# Patient Record
Sex: Male | Born: 1975 | Race: Asian | Hispanic: No | Smoking: Current every day smoker
Health system: Southern US, Community
[De-identification: ages and names within clinical notes are randomized; demographics above are authoritative.]

## PROBLEM LIST (undated history)

## (undated) DIAGNOSIS — K219 Gastro-esophageal reflux disease without esophagitis: Secondary | ICD-10-CM

## (undated) DIAGNOSIS — G4730 Sleep apnea, unspecified: Secondary | ICD-10-CM

## (undated) DIAGNOSIS — T1491XA Suicide attempt, initial encounter: Secondary | ICD-10-CM

## (undated) DIAGNOSIS — Z862 Personal history of diseases of the blood and blood-forming organs and certain disorders involving the immune mechanism: Secondary | ICD-10-CM

## (undated) DIAGNOSIS — R519 Headache, unspecified: Secondary | ICD-10-CM

## (undated) DIAGNOSIS — J189 Pneumonia, unspecified organism: Secondary | ICD-10-CM

## (undated) DIAGNOSIS — I10 Essential (primary) hypertension: Secondary | ICD-10-CM

## (undated) DIAGNOSIS — N529 Male erectile dysfunction, unspecified: Secondary | ICD-10-CM

## (undated) DIAGNOSIS — Z87442 Personal history of urinary calculi: Secondary | ICD-10-CM

## (undated) DIAGNOSIS — S93121A Dislocation of metatarsophalangeal joint of right great toe, initial encounter: Secondary | ICD-10-CM

## (undated) DIAGNOSIS — N200 Calculus of kidney: Secondary | ICD-10-CM

## (undated) DIAGNOSIS — R7303 Prediabetes: Secondary | ICD-10-CM

## (undated) DIAGNOSIS — E291 Testicular hypofunction: Secondary | ICD-10-CM

## (undated) HISTORY — DX: Suicide attempt, initial encounter: T14.91XA

## (undated) HISTORY — DX: Pneumonia, unspecified organism: J18.9

## (undated) HISTORY — DX: Calculus of kidney: N20.0

## (undated) HISTORY — DX: Dislocation of metatarsophalangeal joint of right great toe, initial encounter: S93.121A

## (undated) HISTORY — DX: Essential (primary) hypertension: I10

## (undated) HISTORY — DX: Testicular hypofunction: E29.1

## (undated) HISTORY — DX: Personal history of diseases of the blood and blood-forming organs and certain disorders involving the immune mechanism: Z86.2

## (undated) HISTORY — PX: NO PAST SURGERIES: SHX2092

## (undated) HISTORY — DX: Sleep apnea, unspecified: G47.30

## (undated) HISTORY — DX: Male erectile dysfunction, unspecified: N52.9

---

## 2008-07-15 ENCOUNTER — Emergency Department (HOSPITAL_COMMUNITY): Admission: EM | Admit: 2008-07-15 | Discharge: 2008-07-15 | Payer: Self-pay | Admitting: Emergency Medicine
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
## 2008-07-15 IMAGING — CR DG FOOT COMPLETE 3+V*R*
3 series · 3 of 3 positions shown · non-contrast
Comparison: Previous study of [DATE] at [0O] hours.

CLINICAL DATA: History given of status post reduction first toe
dislocation.

RIGHT FOOT COMPLETE - 3+ VIEW

[view not recorded (1 of 3)]
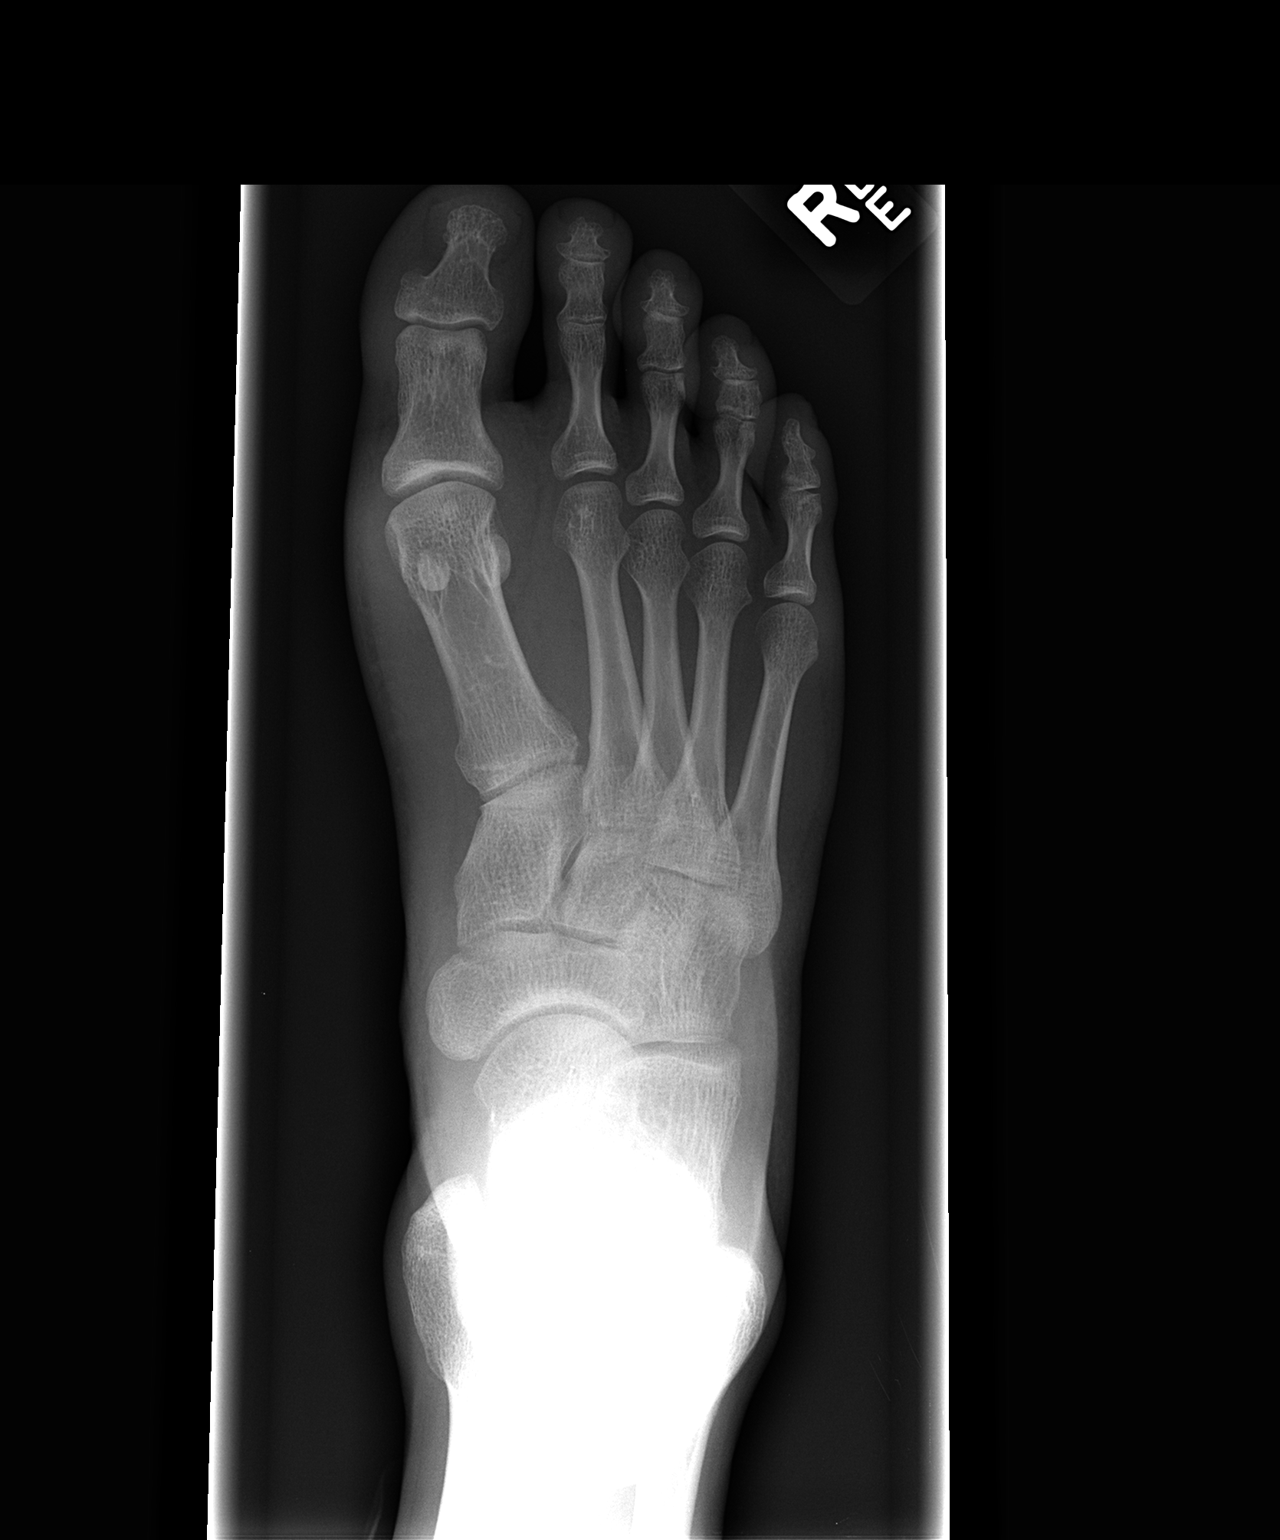

[view not recorded (2 of 3)]
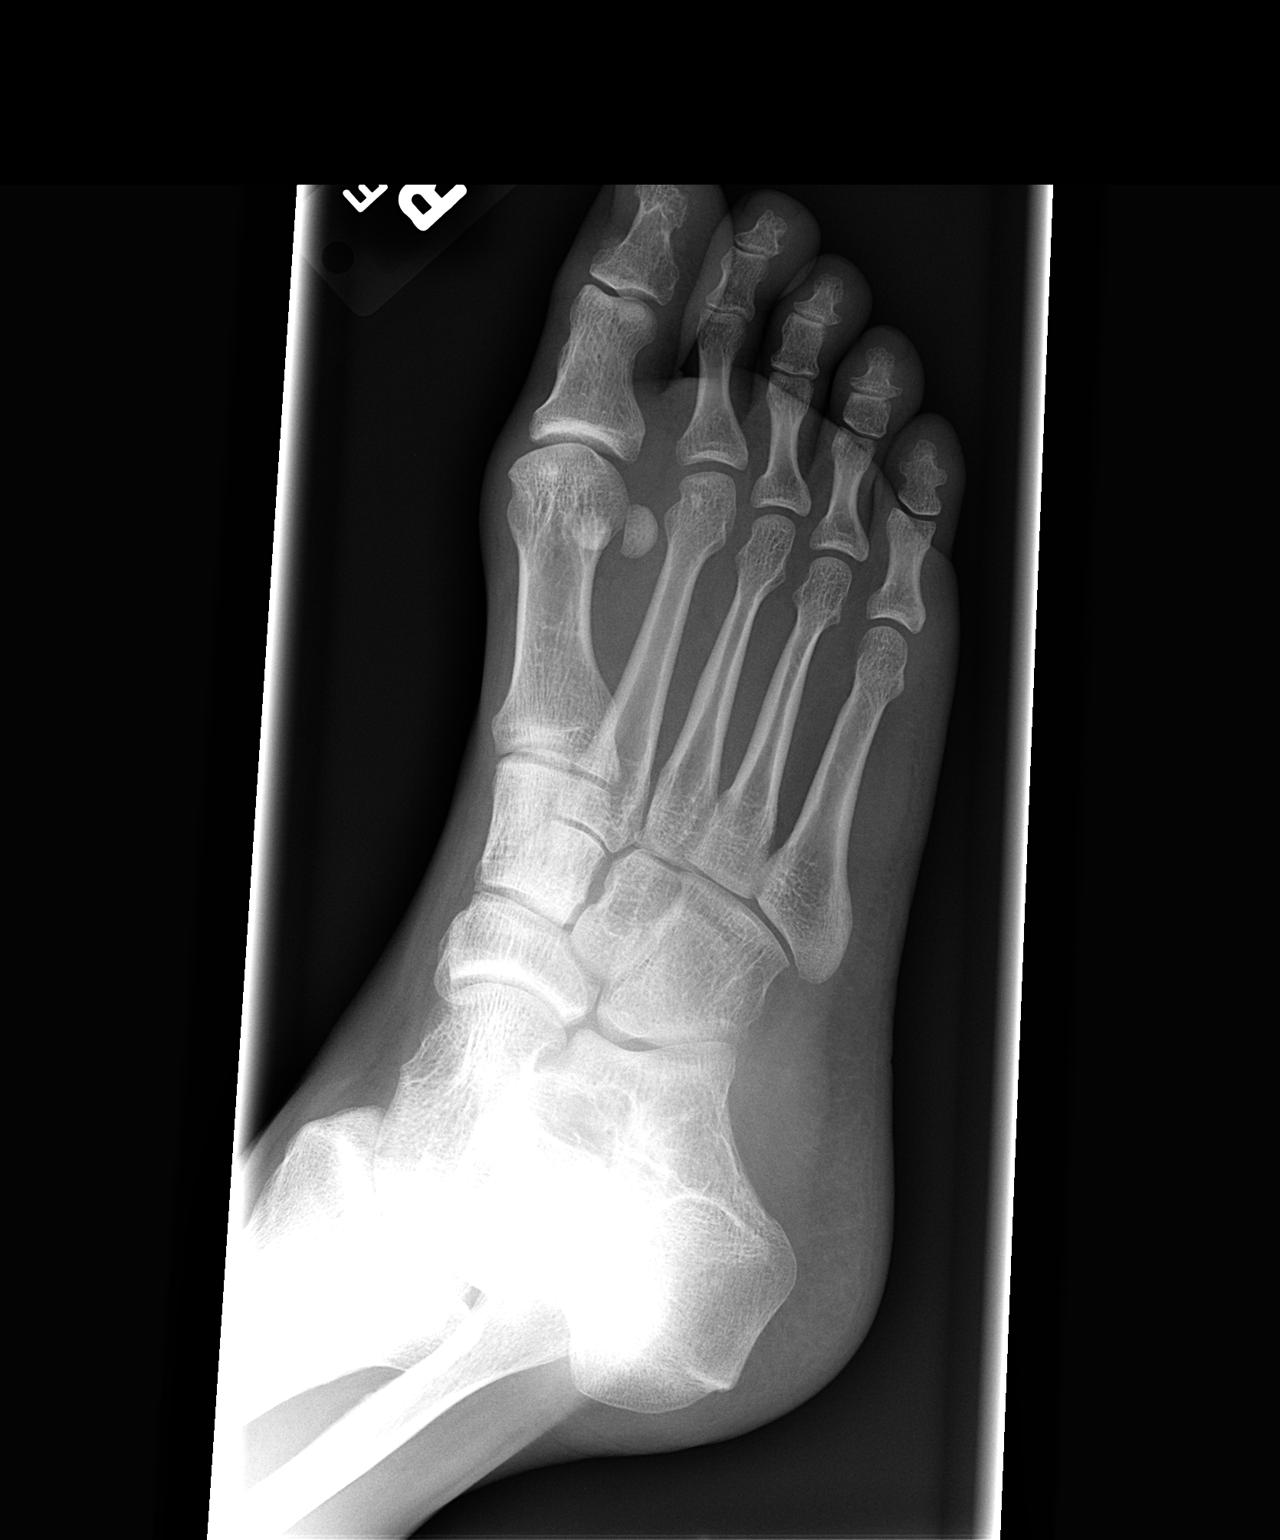

[view not recorded (3 of 3)]
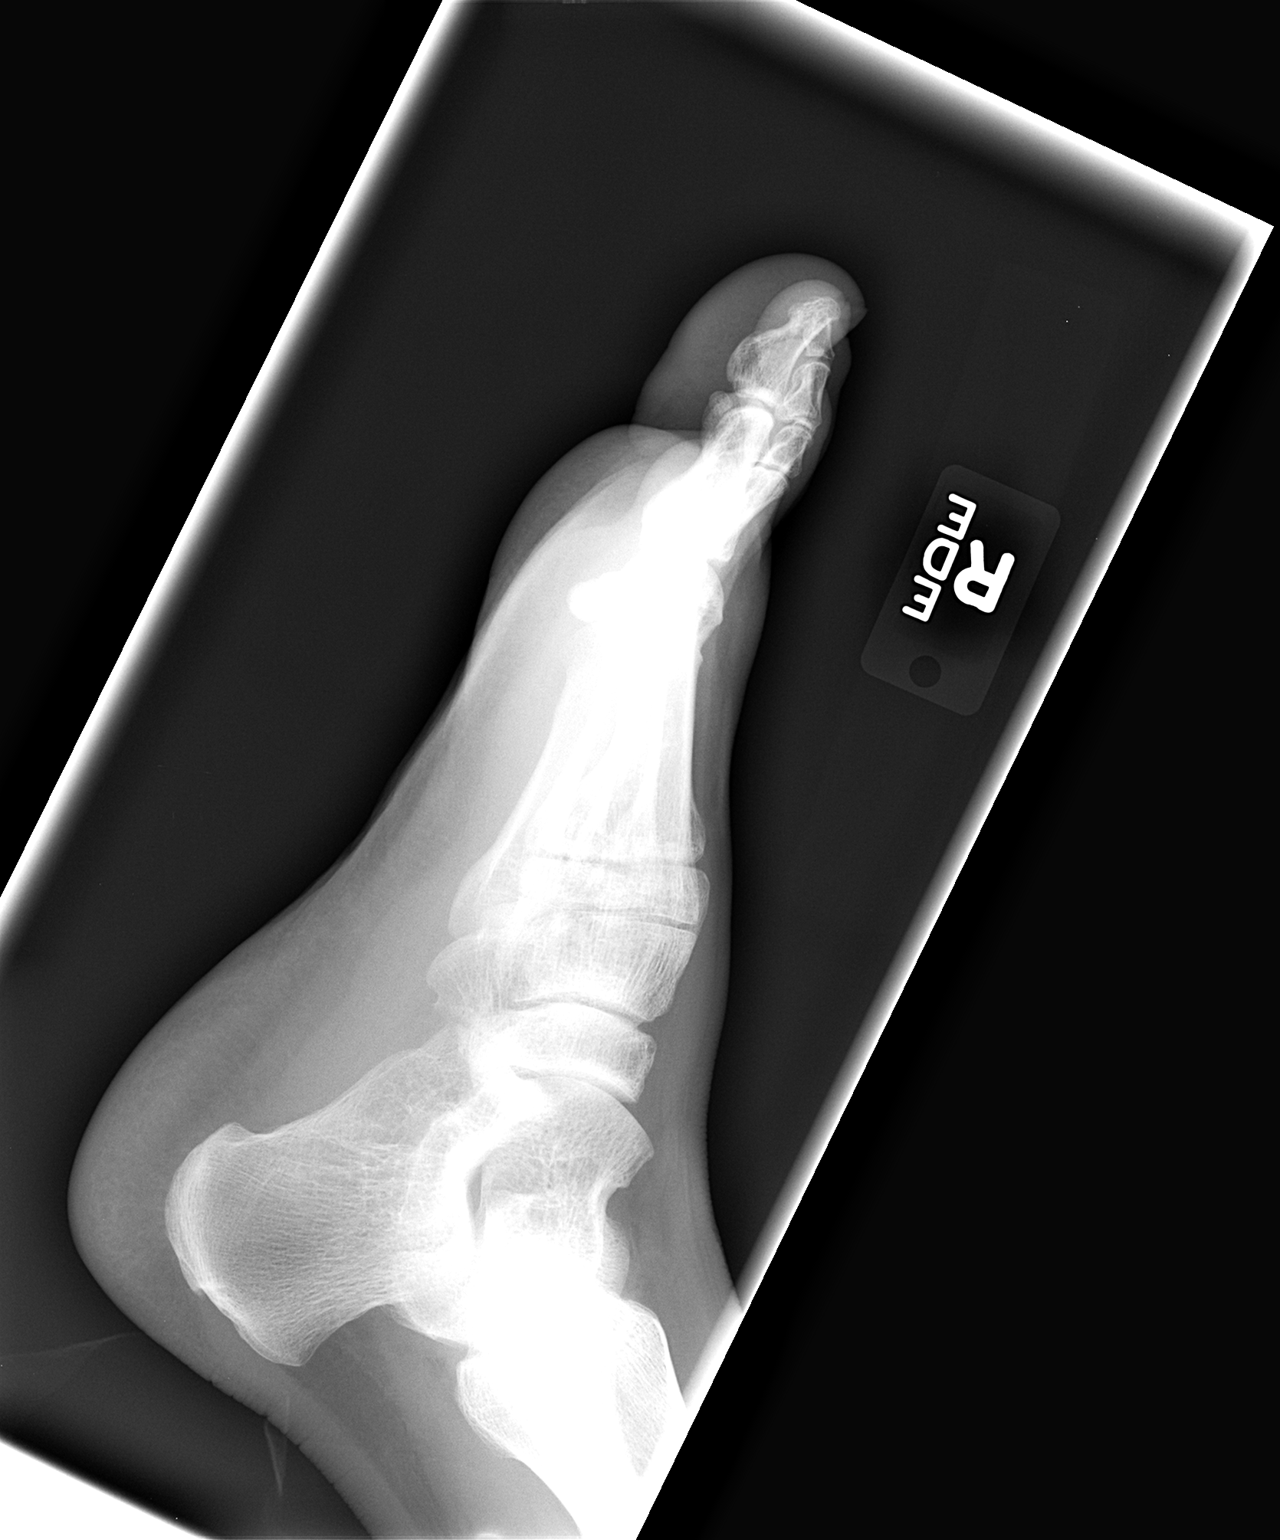

[3 of 3 positions shown; findings below may reference images not displayed]

FINDINGS: Previous study demonstrate dislocation of the first toe
at the first MTP joint.  This has been reduced with relocation. No
fractures evident.
IMPRESSION: The previous dislocation of the first toe at the first MTP joint
has been reduced.  No fracture is evident.
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
## 2010-06-20 LAB — POCT I-STAT, CHEM 8
BUN: 12 mg/dL (ref 6–23)
Calcium, Ion: 1.01 mmol/L — ABNORMAL LOW (ref 1.12–1.32)
HCT: 47 % (ref 39.0–52.0)
Hemoglobin: 16 g/dL (ref 13.0–17.0)
Sodium: 138 mEq/L (ref 135–145)
TCO2: 23 mmol/L (ref 0–100)

## 2010-06-20 LAB — DIFFERENTIAL
Basophils Absolute: 0 10*3/uL (ref 0.0–0.1)
Basophils Relative: 0 % (ref 0–1)
Lymphocytes Relative: 36 % (ref 12–46)
Monocytes Absolute: 0.4 10*3/uL (ref 0.1–1.0)
Neutro Abs: 5.2 10*3/uL (ref 1.7–7.7)
Neutrophils Relative %: 57 % (ref 43–77)

## 2010-06-20 LAB — CBC
Hemoglobin: 15.8 g/dL (ref 13.0–17.0)
MCHC: 35.4 g/dL (ref 30.0–36.0)
RDW: 12.8 % (ref 11.5–15.5)

## 2010-06-20 LAB — PROTIME-INR
INR: 1 (ref 0.00–1.49)
Prothrombin Time: 13.1 seconds (ref 11.6–15.2)

## 2010-06-20 LAB — APTT: aPTT: 34 seconds (ref 24–37)

## 2012-01-29 ENCOUNTER — Encounter: Payer: Self-pay | Admitting: Family Medicine

## 2012-01-29 ENCOUNTER — Ambulatory Visit (INDEPENDENT_AMBULATORY_CARE_PROVIDER_SITE_OTHER): Payer: BC Managed Care – PPO | Admitting: Family Medicine

## 2012-01-29 VITALS — BP 120/78 | HR 80 | Temp 98.8°F | Resp 12 | Ht 68.0 in | Wt 146.0 lb

## 2012-01-29 DIAGNOSIS — R05 Cough: Secondary | ICD-10-CM

## 2012-01-29 DIAGNOSIS — R059 Cough, unspecified: Secondary | ICD-10-CM

## 2012-01-29 DIAGNOSIS — J029 Acute pharyngitis, unspecified: Secondary | ICD-10-CM

## 2012-01-29 LAB — POCT RAPID STREP A (OFFICE): Rapid Strep A Screen: NEGATIVE

## 2012-01-29 MED ORDER — AZITHROMYCIN 250 MG PO TABS: ORAL_TABLET | ORAL | Status: AC
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
## 2012-01-29 NOTE — Patient Instructions (Addendum)
Guaifenesin oral ER tablets  What is this medicine?  GUAIFENESIN (gwye FEN e sin) is an expectorant. It helps to thin mucous and make coughs more productive. This medicine is used to treat coughs caused by colds or the flu. It is not intended to treat chronic cough caused by smoking, asthma, emphysema, or heart failure.  This medicine may be used for other purposes; ask your health care provider or pharmacist if you have questions.  What should I tell my health care provider before I take this medicine?  They need to know if you have any of these conditions:  -fever  -kidney disease  -an unusual or allergic reaction to guaifenesin, other medicines, foods, dyes, or preservatives  -pregnant or trying to get pregnant  -breast-feeding  How should I use this medicine?  Take this medicine by mouth with a full glass of water. Follow the directions on the prescription label. Do not break, chew or crush this medicine. You may take with food or on an empty stomach. Take your medicine at regular intervals. Do not take your medicine more often than directed.  Talk to your pediatrician regarding the use of this medicine in children. While this drug may be prescribed for children as young as 12 years old for selected conditions, precautions do apply.  Overdosage: If you think you have taken too much of this medicine contact a poison control center or emergency room at once.  NOTE: This medicine is only for you. Do not share this medicine with others.  What if I miss a dose?  If you miss a dose, take it as soon as you can. If it is almost time for your next dose, take only that dose. Do not take double or extra doses.  What may interact with this medicine?  Interactions are not expected.   This list may not describe all possible interactions. Give your health care provider a list of all the medicines, herbs, non-prescription drugs, or dietary supplements you use. Also tell them if you smoke, drink alcohol, or use illegal drugs. Some items may interact with your medicine.  What should I watch for while using this medicine?  Do not treat a cough for more than 1 week without consulting your doctor or health care professional. If you also have a high fever, skin rash, continuing headache, or sore throat, see your doctor.  For best results, drink 6 to 8 glasses water daily while you are taking this medicine.  What side effects may I notice from receiving this medicine?  Side effects that you should report to your doctor or health care professional as soon as possible:  -allergic reactions like skin rash, itching or hives, swelling of the face, lips, or tongue  Side effects that usually do not require medical attention (report to your doctor or health care professional if they continue or are bothersome):  -dizziness  -headache  -stomach upset  This list may not describe all possible side effects. Call your doctor for medical advice about side effects. You may report side effects to FDA at 1-800-FDA-1088.  Where should I keep my medicine?  Keep out of the reach of children.  Store at room temperature between 20 and 25 degrees C (68 and 77 degrees F). Keep container tightly closed. Throw away any unused medicine after the expiration date.  NOTE: This sheet is a summary. It may not cover all possible information. If you have questions about this medicine, talk to your doctor, pharmacist, or health 

## 2012-12-24 ENCOUNTER — Ambulatory Visit (INDEPENDENT_AMBULATORY_CARE_PROVIDER_SITE_OTHER): Payer: BC Managed Care – PPO | Admitting: Sports Medicine

## 2012-12-24 ENCOUNTER — Ambulatory Visit (INDEPENDENT_AMBULATORY_CARE_PROVIDER_SITE_OTHER): Payer: BC Managed Care – PPO | Admitting: Family Medicine

## 2012-12-24 ENCOUNTER — Encounter: Payer: Self-pay | Admitting: Family Medicine

## 2012-12-24 VITALS — BP 149/82 | HR 67 | Wt 150.0 lb

## 2012-12-24 DIAGNOSIS — N529 Male erectile dysfunction, unspecified: Secondary | ICD-10-CM

## 2012-12-24 DIAGNOSIS — M6530 Trigger finger, unspecified finger: Secondary | ICD-10-CM

## 2012-12-24 HISTORY — DX: Male erectile dysfunction, unspecified: N52.9

## 2012-12-24 MED ORDER — TADALAFIL 20 MG PO TABS: 20.0000 mg | ORAL_TABLET | Freq: Every day | ORAL | Status: DC | PRN

## 2012-12-24 NOTE — Assessment & Plan Note (Signed)
Ultrasound-guided injections into the third and fourth flexor tendon sheaths of the left hand as above. He will return to see me in one month to see how things are going.

## 2012-12-24 NOTE — Progress Notes (Signed)
CC: Justin Ashley is a 37 y.o. male is here for trigger finger and wants a rx for Cialis   Subjective: HPI:  Patient planes of left hand pain is localized to the third and fourth volar MCP's described only as pain mild in severity worse first thing in the morning associated with inability to completely extend third and fourth left digit for the first few hours of the day, eventually they will give way after opening them with the right hand. This has been present for about a month seems to be worsening a daily basis. 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 He denies redness, swelling, nor warmth of any of the joints in his left hand. This is never happened before. He denies any other motor or sensory disturbances in the left hand  Patient complains of a history of erectile dysfunction that began about 2 years ago. He was once prescribed Cialis and would break the tablets quarters or eights.  He states he has sex with a single partner and has for the last years, he reports sexual activity approximately at least every week and estimates that he uses Cialis estimated once every other month. He will use this during periods when he has trouble maintaining an erection, after one dose he will have resolution of symptoms and will have no trouble initiating or maintaining erection for the weeks following. There is a component of performance anxiety that fluctuates throughout the year.. denies exertional chest pain, limb claudication, shortness of breath, penile discharge, dysuria, nor genital lesions. Denies known side effects her headaches nor vision loss while using Cialis   Review Of Systems Outlined In HPI  Past Medical History  Diagnosis Date  . Dislocation of metatarsophalangeal joint of right great toe   . Pneumonia age 37     Family History  Problem Relation Age of Onset  . Cancer Mother     lung  . Cancer Father    stomach, deceased @  20yrs     History  Substance Use Topics  . Smoking status: Current Every Day Smoker -- 22 years    Types: Cigarettes  . Smokeless tobacco: Never Used     Comment: e-cigs smoker x 3 years  . Alcohol Use: Not on file     Objective: Filed Vitals:   12/24/12 1146  BP: 149/82  Pulse: 67    General: Alert and Oriented, No Acute Distress HEENT: Pupils equal, round, reactive to light. Conjunctivae clear. Moist mucous membranes pharynx unremarkable Lungs: Clear to auscultation bilaterally, no wheezing/ronchi/rales.  Comfortable work of breathing. Good air movement. Cardiac: Regular rate and rhythm. Normal S1/S2.  No murmurs, rubs, nor gallops.   Extremities: No peripheral edema.  Strong peripheral pulses. Left fourth and third digit have full range of motion and strength there is mild tenderness to palpation of the MCP joints there is a small palpable nodule on the flexor tendons of these digits Mental Status: No depression, anxiety, nor agitation. Skin: Warm and dry.  Assessment & Plan: Justin Ashley was seen today for trigger finger and wants a rx for cialis.  Diagnoses and associated orders for this visit:  Left trigger finger  Erectile dysfunction - tadalafil (CIALIS) 20 MG tablet; Take 1 tablet (20 mg total) by mouth daily as needed for erectile dysfunction.    Left trigger finger: Patient was referred to Dr. Karie Schwalbe. in sports medicine for consideration of steroid injection for trigger finger in  the third and fourth digit, patient expresses he would like to avoid surgery at all costs.. Erectile dysfunction: Continue as needed Cialis taking smallest dose as possible infrequently as possible.Signs and symptoms requring emergent/urgent reevaluation were discussed with the patient.  Return in about 4 weeks (around 01/21/2013) for Dr. Karie Schwalbe Follow up.

## 2012-12-24 NOTE — Progress Notes (Signed)
  Procedure: Real-time Ultrasound Guided Injection of left fourth flexor tendon sheath. Device: GE Logiq E  Verbal informed consent obtained.  Time-out conducted.  Noted no overlying erythema, induration, or other signs of local infection.  Skin prepped in a sterile fashion.  Local anesthesia: Topical Ethyl chloride.  With sterile technique and under real time ultrasound guidance:  25-gauge needle advanced into the flexor tendon sheath near a visible and palpable flexor nodule. 0.5 cc Kenalog 40, 1 cc lidocaine injected easily. Completed without difficulty  Pain immediately resolved suggesting accurate placement of the medication.  Advised to call if fevers/chills, erythema, induration, drainage, or persistent bleeding.  Images permanently stored and available for review in the ultrasound unit.  Impression: Technically successful ultrasound guided injection.  Procedure: Real-time Ultrasound Guided Injection of left third flexor tendon sheath. Device: GE Logiq E  Verbal informed consent obtained.  Time-out conducted.  Noted no overlying erythema, induration, or other signs of local infection.  Skin prepped in a sterile fashion.  Local anesthesia: Topical Ethyl chloride.  With sterile technique and under real time ultrasound guidance:  25-gauge needle advanced into the flexor tendon sheath near a visible and palpable flexor nodule. 0.5 cc Kenalog 40, 1 cc lidocaine injected easily. Completed without difficulty  Pain immediately resolved suggesting accurate placement of the medication.  Advised to call if fevers/chills, erythema, induration, drainage, or persistent bleeding.  Images permanently stored and available for review in the ultrasound unit.  Impression: Technically successful ultrasound guided injection.

## 2013-01-21 ENCOUNTER — Encounter: Payer: Self-pay | Admitting: Sports Medicine

## 2013-01-21 ENCOUNTER — Ambulatory Visit (INDEPENDENT_AMBULATORY_CARE_PROVIDER_SITE_OTHER): Payer: BC Managed Care – PPO | Admitting: Sports Medicine

## 2013-01-21 VITALS — BP 133/89 | HR 76 | Wt 148.0 lb

## 2013-01-21 DIAGNOSIS — M6530 Trigger finger, unspecified finger: Secondary | ICD-10-CM

## 2013-01-21 NOTE — Assessment & Plan Note (Signed)
Good response to initial third and fourth flexor tendon sheath injections. Pain was resolved but he still had some triggering I peppered the A1 pulley, as well as injected a small amount of medicine intratendinous, I also placed the rest of the medication in the tendon sheath and both the fourth and third flexor tendons of the left hand. Return to see me in one month.

## 2013-01-21 NOTE — Progress Notes (Signed)
  Subjective:    CC: Recheck trigger fingers   HPI: This is an extremely pleasant 37 year old male, he owns a dry cleaning business. A month ago I injected his left third and fourth flexor tendon sheaths for trigger fingers. He had a fantastic response, essentially has no pain now but still has occasional triggering. Symptoms are moderate, persistent, worsening.  Past medical history, Surgical history, Family history not pertinant except as noted below, Social history, Allergies, and medications have been entered into the medical record, reviewed, and no changes needed.   Review of Systems: No fevers, chills, night sweats, weight loss, chest pain, or shortness of breath.   Objective:    General: Well Developed, well nourished, and in no acute distress.  Neuro: Alert and oriented x3, extra-ocular muscles intact, sensation grossly intact.  HEENT: Normocephalic, atraumatic, pupils equal round reactive to light, neck supple, no masses, no lymphadenopathy, thyroid nonpalpable.  Skin: Warm and dry, no rashes. Cardiac: Regular rate and rhythm, no murmurs rubs or gallops, no lower extremity edema.  Respiratory: Clear to auscultation bilaterally. Not using accessory muscles, speaking in full sentences. Left hand: There is still a palpable nodule in the flexor tendon sheath of the third and fourth digits, I can feel the nodule trigger just proximal to the A1 pulley. There is really no tenderness to palpation.  Procedure: Real-time Ultrasound Guided Injection of left third flexor tendon sheath. Device: GE Logiq E  Verbal informed consent obtained.  Time-out conducted.  Noted no overlying erythema, induration, or other signs of local infection.  Skin prepped in a sterile fashion.  Local anesthesia: Topical Ethyl chloride.  With sterile technique and under real time ultrasound guidance:  I placed a small amount of medication into the flexor tendon sheath, I then Peppered the A1 pulley with my needle,  I then performed an intratendinous injection into the flexor tendon nodule that seemed to come from both the flexor digitorum superficialis and profundus. A total of 0.5 cc Kenalog 40, 1 cc lidocaine was used. Completed without difficulty  Pain immediately resolved suggesting accurate placement of the medication.  Advised to call if fevers/chills, erythema, induration, drainage, or persistent bleeding.  Images permanently stored and available for review in the ultrasound unit.  Impression: Technically successful ultrasound guided injection.  Procedure: Real-time Ultrasound Guided Injection of left fourth flexor tendon sheath. Device: GE Logiq E  Verbal informed consent obtained.  Time-out conducted.  Noted no overlying erythema, induration, or other signs of local infection.  Skin prepped in a sterile fashion.  Local anesthesia: Topical Ethyl chloride.  With sterile technique and under real time ultrasound guidance:  I placed a small amount of medication into the flexor tendon sheath, I then Peppered the A1 pulley with my needle, I then performed an intratendinous injection into the flexor tendon nodule that seemed to come from both the flexor digitorum superficialis and profundus. A total of 0.5 cc Kenalog 40, 1 cc lidocaine was used. Completed without difficulty  Pain immediately resolved suggesting accurate placement of the medication.  Advised to call if fevers/chills, erythema, induration, drainage, or persistent bleeding.  Images permanently stored and available for review in the ultrasound unit.  Impression: Technically successful ultrasound guided injection.  Impression and Recommendations:

## 2013-02-18 ENCOUNTER — Ambulatory Visit: Payer: BC Managed Care – PPO | Admitting: Sports Medicine

## 2013-03-09 ENCOUNTER — Ambulatory Visit (INDEPENDENT_AMBULATORY_CARE_PROVIDER_SITE_OTHER): Payer: BC Managed Care – PPO | Admitting: Sports Medicine

## 2013-03-09 ENCOUNTER — Encounter: Payer: Self-pay | Admitting: Sports Medicine

## 2013-03-09 ENCOUNTER — Ambulatory Visit (INDEPENDENT_AMBULATORY_CARE_PROVIDER_SITE_OTHER): Payer: BC Managed Care – PPO

## 2013-03-09 VITALS — BP 137/92 | HR 69 | Wt 153.0 lb

## 2013-03-09 DIAGNOSIS — Z862 Personal history of diseases of the blood and blood-forming organs and certain disorders involving the immune mechanism: Secondary | ICD-10-CM

## 2013-03-09 DIAGNOSIS — M542 Cervicalgia: Secondary | ICD-10-CM

## 2013-03-09 DIAGNOSIS — M5030 Other cervical disc degeneration, unspecified cervical region: Secondary | ICD-10-CM | POA: Insufficient documentation

## 2013-03-09 DIAGNOSIS — M6530 Trigger finger, unspecified finger: Secondary | ICD-10-CM

## 2013-03-09 HISTORY — DX: Personal history of diseases of the blood and blood-forming organs and certain disorders involving the immune mechanism: Z86.2

## 2013-03-09 LAB — CBC
HCT: 45.4 % (ref 39.0–52.0)
Hemoglobin: 15.6 g/dL (ref 13.0–17.0)
MCH: 31.1 pg (ref 26.0–34.0)
MCHC: 34.4 g/dL (ref 30.0–36.0)
MCV: 90.6 fL (ref 78.0–100.0)
Platelets: 420 10*3/uL — ABNORMAL HIGH (ref 150–400)
RBC: 5.01 MIL/uL (ref 4.22–5.81)
RDW: 13.7 % (ref 11.5–15.5)
WBC: 5.6 10*3/uL (ref 4.0–10.5)

## 2013-03-09 MED ORDER — MELOXICAM 15 MG PO TABS: ORAL_TABLET | ORAL | Status: DC

## 2013-03-09 NOTE — Assessment & Plan Note (Signed)
Was palpable tenderness over the mastoid process on the right. This likely represents muscle spasm. X-rays, Mobic, home rehabilitation. Return to see me in one month regarding this.

## 2013-03-09 NOTE — Progress Notes (Signed)
  Subjective:    CC: Follow up  HPI: Trigger finger: I have performed 2 guided injections into both the third and fourth flexor tendon sheaths of his left hand. His pain is now 100% resolved however he still does have occasional mechanical triggering. 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  Neck pain: Present for a week and a half, localized in the right side of the upper neck without radiation down the arm, worse with looking forward and down for a long period of time. Moderate, persistent.  History of thrombocytosis: Present a long time ago, desires to have this rechecked.  Past medical history, Surgical history, Family history not pertinant except as noted below, Social history, Allergies, and medications have been entered into the medical record, reviewed, and no changes needed.   Review of Systems: No fevers, chills, night sweats, weight loss, chest pain, or shortness of breath.   Objective:    General: Well Developed, well nourished, and in no acute distress.  Neuro: Alert and oriented x3, extra-ocular muscles intact, sensation grossly intact.  HEENT: Normocephalic, atraumatic, pupils equal round reactive to light, neck supple, no masses, no lymphadenopathy, thyroid nonpalpable.  Skin: Warm and dry, no rashes. Cardiac: Regular rate and rhythm, no murmurs rubs or gallops, no lower extremity edema.  Respiratory: Clear to auscultation bilaterally. Not using accessory muscles, speaking in full sentences. Left hand: Unremarkable, still gets occasional triggering it he still does have a palpable tender nodule in the third fourth flexor tendons. There is no pain. Neck: Inspection unremarkable. No palpable stepoffs. Only minimal tenderness to palpation along the right upper paracervical muscles. Negative Spurling's maneuver. Full neck range of motion Grip strength and sensation normal in bilateral hands Strength good C4 to T1 distribution No sensory  change to C4 to T1 Negative Hoffman sign bilaterally Reflexes normal  X-rays were reviewed, there is straightening of the normal cervical lordosis suggestive of spasm, there is also early anterior osteophytic bridging at the C5-C6 level as well as posterior uncovertebral spurring.  Impression and Recommendations:

## 2013-03-09 NOTE — Assessment & Plan Note (Signed)
Completely pain-free though some mechanical triggering remains. At this point he is happy with his results, and we will leave this alone for now.

## 2013-03-09 NOTE — Assessment & Plan Note (Signed)
Rechecking CBC. If abnormal he will follow this up with his PCP/medical provider.

## 2013-03-10 ENCOUNTER — Telehealth: Payer: Self-pay | Admitting: *Deleted

## 2013-03-10 MED ORDER — TRAMADOL HCL 50 MG PO TABS: ORAL_TABLET | ORAL | Status: DC

## 2013-03-10 NOTE — Telephone Encounter (Signed)
Pt called asking if he could have something stronger for pain than meloxicam.  He does NOT want any narcotics.

## 2013-03-10 NOTE — Telephone Encounter (Signed)
Tramadol is RX'ed. Will be in my box.

## 2013-03-10 NOTE — Telephone Encounter (Signed)
Pt.notified

## 2013-04-09 ENCOUNTER — Encounter: Payer: Self-pay | Admitting: Sports Medicine

## 2013-04-09 ENCOUNTER — Ambulatory Visit: Payer: BC Managed Care – PPO | Admitting: Family Medicine

## 2013-04-09 ENCOUNTER — Ambulatory Visit (INDEPENDENT_AMBULATORY_CARE_PROVIDER_SITE_OTHER): Payer: BC Managed Care – PPO | Admitting: Sports Medicine

## 2013-04-09 VITALS — BP 137/88 | HR 87 | Ht 68.0 in | Wt 153.0 lb

## 2013-04-09 DIAGNOSIS — M6530 Trigger finger, unspecified finger: Secondary | ICD-10-CM

## 2013-04-09 DIAGNOSIS — N529 Male erectile dysfunction, unspecified: Secondary | ICD-10-CM

## 2013-04-09 DIAGNOSIS — M5030 Other cervical disc degeneration, unspecified cervical region: Secondary | ICD-10-CM

## 2013-04-09 MED ORDER — CYCLOBENZAPRINE HCL 10 MG PO TABS: ORAL_TABLET | ORAL | Status: DC

## 2013-04-09 MED ORDER — NAPROXEN 500 MG PO TABS: 500.0000 mg | ORAL_TABLET | Freq: Two times a day (BID) | ORAL | Status: DC

## 2013-04-09 MED ORDER — TADALAFIL 20 MG PO TABS: 20.0000 mg | ORAL_TABLET | Freq: Every day | ORAL | Status: DC | PRN

## 2013-04-09 MED ORDER — PREDNISONE 50 MG PO TABS: ORAL_TABLET | ORAL | Status: DC

## 2013-04-09 NOTE — Assessment & Plan Note (Signed)
Clinically with right-sided upper cervical pain. Formal physical therapy, switching to naproxen 500, prednisone, low-dose Flexeril at bedtime. Return to see me in one month, if no better we will obtain an MRI for interventional injection planning.

## 2013-04-09 NOTE — Progress Notes (Signed)
  Subjective:    CC: Follow up  HPI: Trigger fingers: Continue to do well after 2 ultrasound guided flexor tendon sheath injections.  Neck pain: X-rays did show C5-C6 cervical degenerative disc disease, he has not been compliant with his rehabilitation exercises, Mobic and trauma or also really not been effective. Is moderate, persistent with radiation into the right upper shoulder.  Erectile dysfunction: Asking if he can have some samples of Cialis 20  Past medical history, Surgical history, Family history not pertinant except as noted below, Social history, Allergies, and medications have been entered into the medical record, reviewed, and no changes needed.   Review of Systems: No fevers, chills, night sweats, weight loss, chest pain, or shortness of breath.   Objective:    General: Well Developed, well nourished, and in no acute distress.  Neuro: Alert and oriented x3, extra-ocular muscles intact, sensation grossly intact.  HEENT: Normocephalic, atraumatic, pupils equal round reactive to light, neck supple, no masses, no lymphadenopathy, thyroid nonpalpable.  Skin: Warm and dry, no rashes. Cardiac: Regular rate and rhythm, no murmurs rubs or gallops, no lower extremity edema.  Respiratory: Clear to auscultation bilaterally. Not using accessory muscles, speaking in full sentences. Neck: Inspection unremarkable. No palpable stepoffs. Negative Spurling's maneuver. Full neck range of motion Grip strength and sensation normal in bilateral hands Strength good C4 to T1 distribution No sensory change to C4 to T1 Negative Hoffman sign bilaterally Reflexes normal  Impression and Recommendations:

## 2013-04-09 NOTE — Assessment & Plan Note (Signed)
Has improved significantly after injections, still has some tightness but overall better. No further injections for now.

## 2013-04-09 NOTE — Assessment & Plan Note (Signed)
Cialis samples given. 

## 2013-04-15 ENCOUNTER — Ambulatory Visit: Payer: BC Managed Care – PPO | Admitting: Physical Therapy

## 2013-04-15 IMAGING — CR DG CERVICAL SPINE COMPLETE 4+V
5 series · 5 of 5 positions shown · non-contrast
Comparison: CT cervical spine from [DATE]

CLINICAL DATA: Right-sided upper neck pain

EXAM:
CERVICAL SPINE  4+ VIEWS

[view not recorded (1 of 5)]
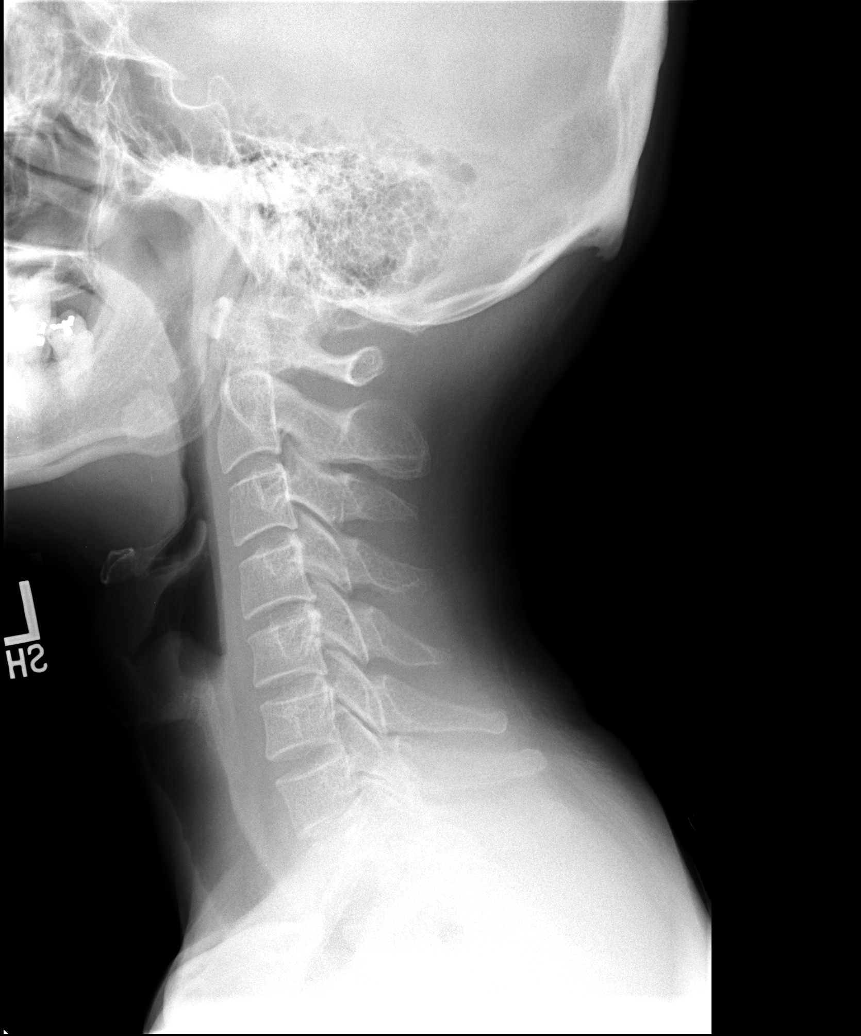

[view not recorded (2 of 5)]
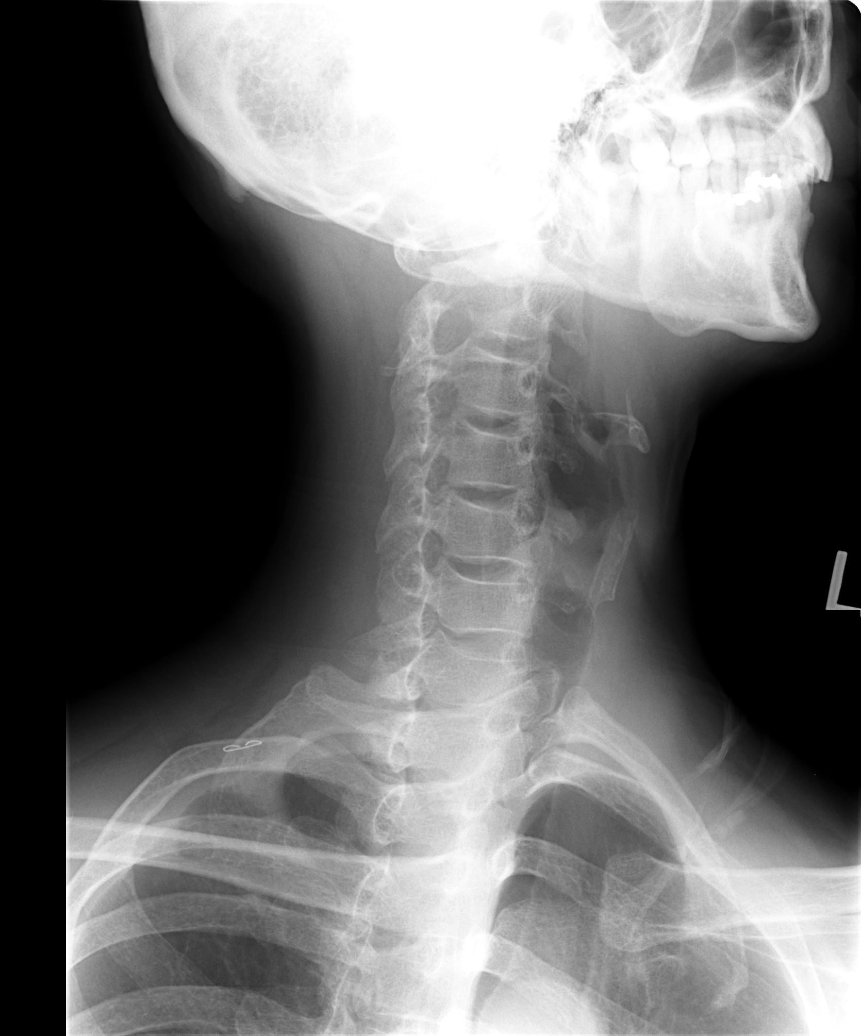

[view not recorded (3 of 5)]
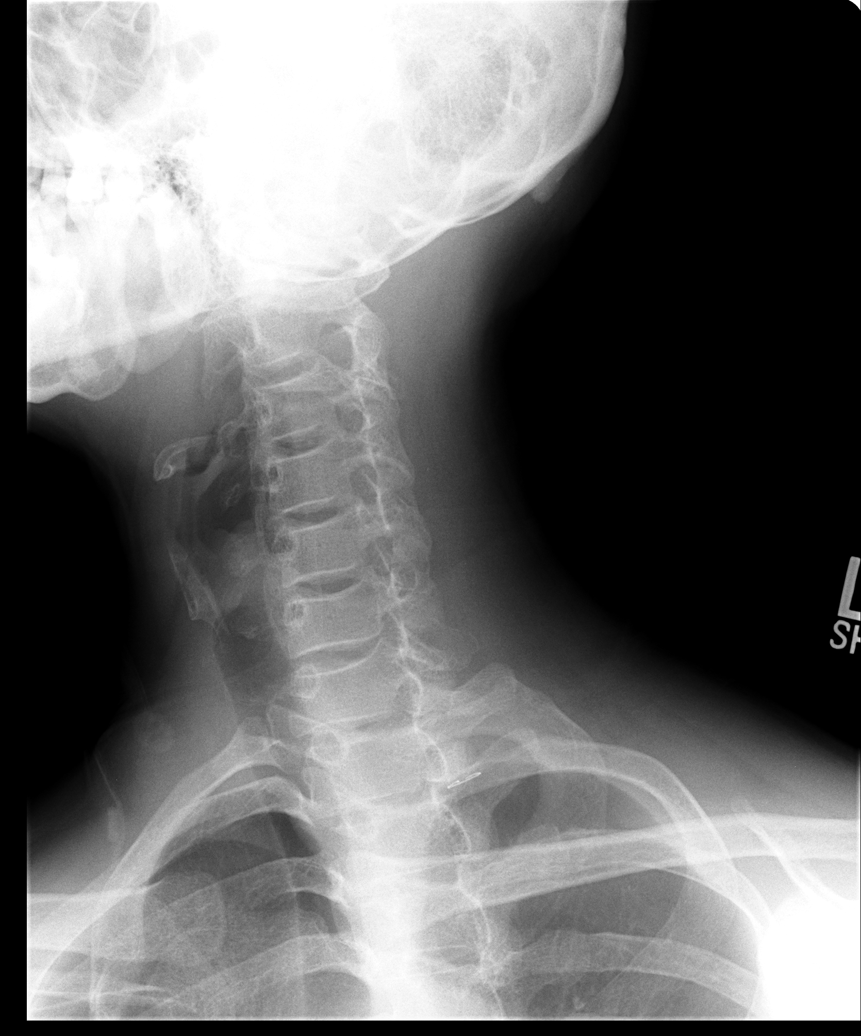

[view not recorded (4 of 5)]
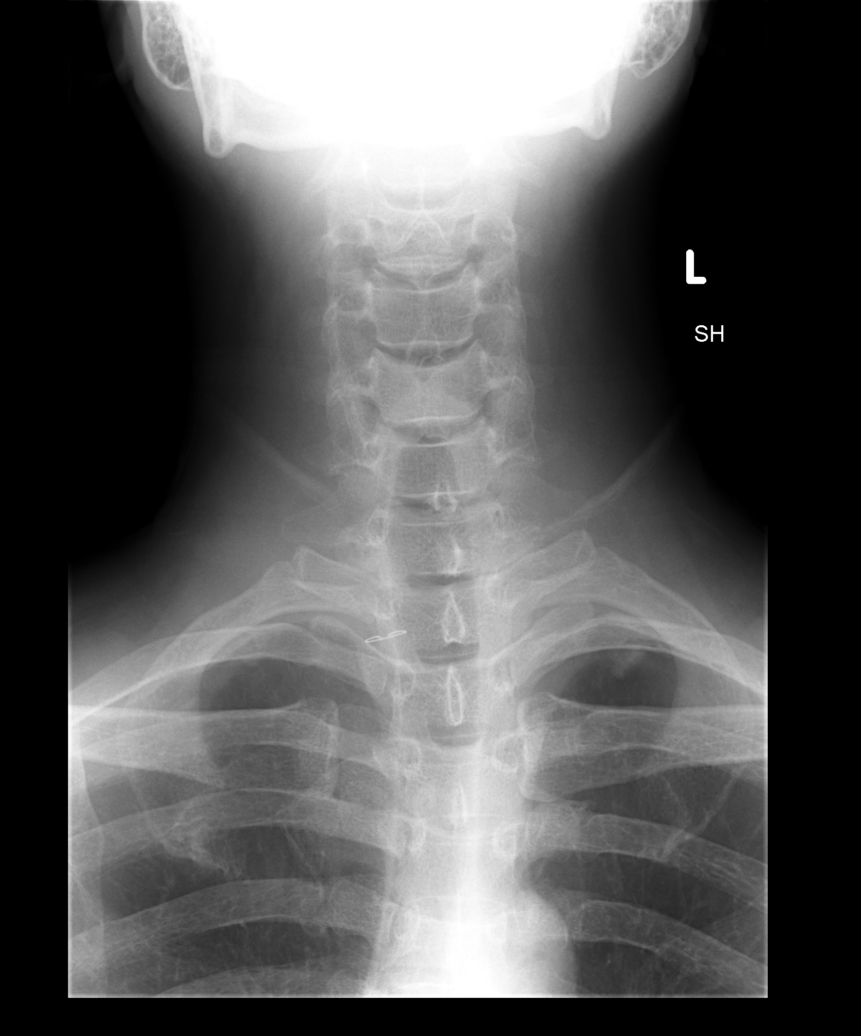

[view not recorded (5 of 5)]
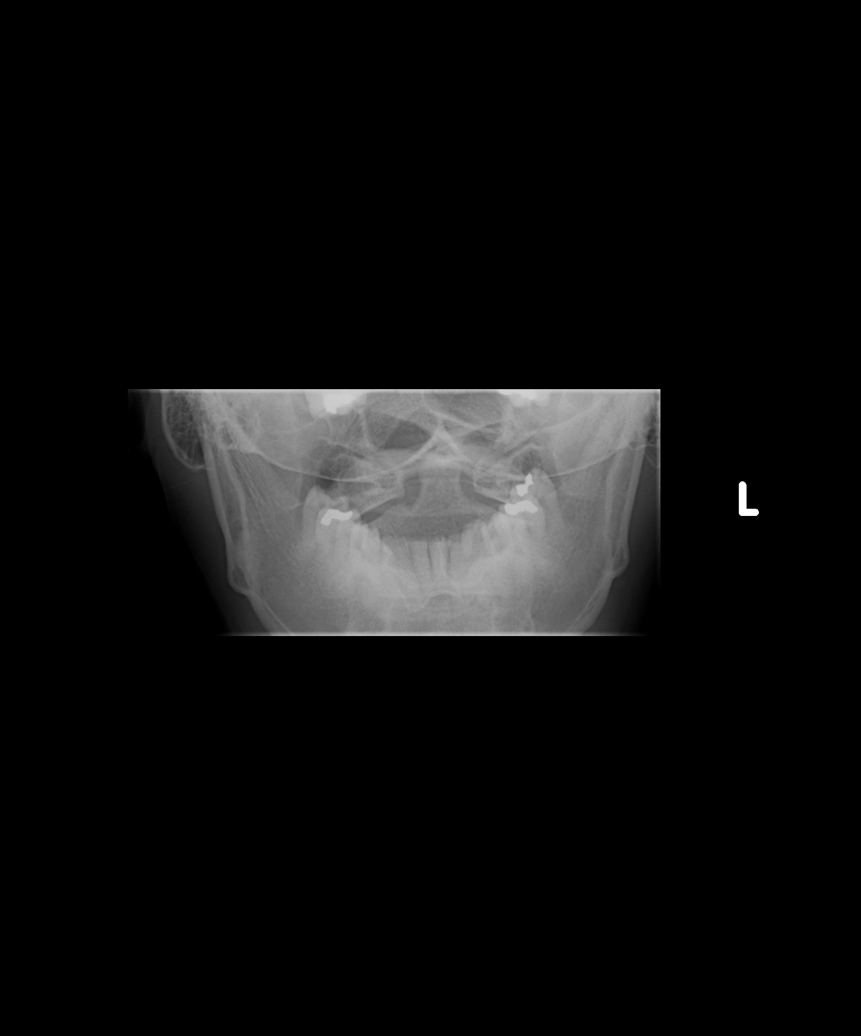

[5 of 5 positions shown; findings below may reference images not displayed]

FINDINGS: There is no evidence of cervical spine fracture or prevertebral soft
tissue swelling. Alignment is normal. No other significant bone
abnormalities are identified.
IMPRESSION: Negative cervical spine radiographs.

## 2013-04-21 ENCOUNTER — Ambulatory Visit (INDEPENDENT_AMBULATORY_CARE_PROVIDER_SITE_OTHER): Payer: BC Managed Care – PPO | Admitting: Physical Therapy

## 2013-04-21 DIAGNOSIS — M5030 Other cervical disc degeneration, unspecified cervical region: Secondary | ICD-10-CM

## 2013-04-21 DIAGNOSIS — M2560 Stiffness of unspecified joint, not elsewhere classified: Secondary | ICD-10-CM

## 2013-04-21 DIAGNOSIS — M542 Cervicalgia: Secondary | ICD-10-CM

## 2013-04-23 ENCOUNTER — Encounter (INDEPENDENT_AMBULATORY_CARE_PROVIDER_SITE_OTHER): Payer: BC Managed Care – PPO | Admitting: Physical Therapy

## 2013-04-23 DIAGNOSIS — M5030 Other cervical disc degeneration, unspecified cervical region: Secondary | ICD-10-CM

## 2013-04-23 DIAGNOSIS — M542 Cervicalgia: Secondary | ICD-10-CM

## 2013-04-23 DIAGNOSIS — M2560 Stiffness of unspecified joint, not elsewhere classified: Secondary | ICD-10-CM

## 2013-04-28 ENCOUNTER — Encounter: Payer: BC Managed Care – PPO | Admitting: Physical Therapy

## 2013-04-30 ENCOUNTER — Encounter: Payer: BC Managed Care – PPO | Admitting: Physical Therapy

## 2013-05-05 ENCOUNTER — Encounter: Payer: BC Managed Care – PPO | Admitting: Physical Therapy

## 2013-05-07 ENCOUNTER — Ambulatory Visit: Payer: BC Managed Care – PPO | Admitting: Sports Medicine

## 2013-05-11 ENCOUNTER — Ambulatory Visit (INDEPENDENT_AMBULATORY_CARE_PROVIDER_SITE_OTHER): Payer: BC Managed Care – PPO | Admitting: Sports Medicine

## 2013-05-11 ENCOUNTER — Encounter (INDEPENDENT_AMBULATORY_CARE_PROVIDER_SITE_OTHER): Payer: BC Managed Care – PPO | Admitting: Physical Therapy

## 2013-05-11 ENCOUNTER — Telehealth: Payer: Self-pay | Admitting: *Deleted

## 2013-05-11 ENCOUNTER — Encounter: Payer: Self-pay | Admitting: Sports Medicine

## 2013-05-11 VITALS — BP 141/91 | HR 84 | Ht 68.0 in | Wt 158.0 lb

## 2013-05-11 DIAGNOSIS — M2560 Stiffness of unspecified joint, not elsewhere classified: Secondary | ICD-10-CM

## 2013-05-11 DIAGNOSIS — M542 Cervicalgia: Secondary | ICD-10-CM

## 2013-05-11 DIAGNOSIS — M5030 Other cervical disc degeneration, unspecified cervical region: Secondary | ICD-10-CM

## 2013-05-11 DIAGNOSIS — M6530 Trigger finger, unspecified finger: Secondary | ICD-10-CM

## 2013-05-11 NOTE — Progress Notes (Signed)
  Subjective:    CC: Follow up  HPI: Neck pain: Symptoms continue to improve, he does have some radicular symptoms radiating down the right shoulder in a C5 distribution, he does want to proceed with further modalities such as acupuncture and chiropractic care before considering epidural.  Trigger finger: Continues to do well after injections, gets some pain due to his job, but overall doing well.  Past medical history, Surgical history, Family history not pertinant except as noted below, Social history, Allergies, and medications have been entered into the medical record, reviewed, and no changes needed.   Review of Systems: No fevers, chills, night sweats, weight loss, chest pain, or shortness of breath.   Objective:    General: Well Developed, well nourished, and in no acute distress.  Neuro: Alert and oriented x3, extra-ocular muscles intact, sensation grossly intact.  HEENT: Normocephalic, atraumatic, pupils equal round reactive to light, neck supple, no masses, no lymphadenopathy, thyroid nonpalpable.  Skin: Warm and dry, no rashes. Cardiac: Regular rate and rhythm, no murmurs rubs or gallops, no lower extremity edema.  Respiratory: Clear to auscultation bilaterally. Not using accessory muscles, speaking in full sentences.  I did review his CT scan, x-rays were negative, CT showed multilevel disc protrusions at C4-5, and C5-C6 level.  Impression and Recommendations:

## 2013-05-11 NOTE — Assessment & Plan Note (Signed)
Doing okay, we will keep an eye on this for now.

## 2013-05-11 NOTE — Assessment & Plan Note (Addendum)
Symptoms are referable to the right C5 nerve root. At this point symptoms continue to improve, and have not plateaued. I do think that he should investigate other modalities such as acupuncture and chiropractic as before we consider something invasive like an epidural. In the meantime we certainly do need an MRI for further characterization of his descriptions, I do see moderate sized disc protrusions and his CT scan of the cervical spine from 2010, he does have protrusions at the C4-5 and C5-6 levels. I will call him with results of the MRI.

## 2013-05-11 NOTE — Telephone Encounter (Signed)
PA obtained for MRI Cervical w/o contrast. Auth # [id number] good thru 06/09/13.  Oscar La, LPN

## 2013-05-15 ENCOUNTER — Telehealth: Payer: Self-pay

## 2013-05-15 ENCOUNTER — Telehealth: Payer: Self-pay | Admitting: Sports Medicine

## 2013-05-15 NOTE — Telephone Encounter (Signed)
Noted, please let me know if he would be amenable to a CT scan which isnt as good for what we are looking for but much cheaper.

## 2013-05-15 NOTE — Telephone Encounter (Signed)
Patient called stated that he is cancelling his Mri for tomorrow due to cost. He said even with insurance it is still too high. Just wanted to let you know. Thanks

## 2013-05-15 NOTE — Telephone Encounter (Signed)
Justin Ashley called stated that patient did not have MRI done because he could not afford it. She told him that he could be put on payment plan but he still refused. Rhonda Cunningham,CMA

## 2013-05-16 ENCOUNTER — Ambulatory Visit (HOSPITAL_BASED_OUTPATIENT_CLINIC_OR_DEPARTMENT_OTHER): Payer: BC Managed Care – PPO

## 2013-05-19 ENCOUNTER — Encounter (INDEPENDENT_AMBULATORY_CARE_PROVIDER_SITE_OTHER): Payer: BC Managed Care – PPO | Admitting: Physical Therapy

## 2013-05-19 DIAGNOSIS — M5030 Other cervical disc degeneration, unspecified cervical region: Secondary | ICD-10-CM

## 2013-05-19 DIAGNOSIS — M542 Cervicalgia: Secondary | ICD-10-CM

## 2013-05-19 DIAGNOSIS — M2560 Stiffness of unspecified joint, not elsewhere classified: Secondary | ICD-10-CM

## 2013-05-21 NOTE — Telephone Encounter (Signed)
Left message on patient vm asking if he would be ok with getting a CT scan. Rhonda Cunningham,CMA

## 2013-12-08 ENCOUNTER — Ambulatory Visit: Payer: BC Managed Care – PPO | Admitting: Family Medicine

## 2013-12-10 ENCOUNTER — Encounter: Payer: Self-pay | Admitting: Family Medicine

## 2013-12-10 ENCOUNTER — Ambulatory Visit (INDEPENDENT_AMBULATORY_CARE_PROVIDER_SITE_OTHER): Payer: BC Managed Care – PPO | Admitting: Family Medicine

## 2013-12-10 VITALS — BP 136/89 | HR 101 | Wt 140.0 lb

## 2013-12-10 DIAGNOSIS — R319 Hematuria, unspecified: Secondary | ICD-10-CM | POA: Diagnosis not present

## 2013-12-10 DIAGNOSIS — L700 Acne vulgaris: Secondary | ICD-10-CM | POA: Diagnosis not present

## 2013-12-10 DIAGNOSIS — Z9151 Personal history of suicidal behavior: Secondary | ICD-10-CM | POA: Insufficient documentation

## 2013-12-10 DIAGNOSIS — N529 Male erectile dysfunction, unspecified: Secondary | ICD-10-CM | POA: Diagnosis not present

## 2013-12-10 DIAGNOSIS — Z915 Personal history of self-harm: Secondary | ICD-10-CM | POA: Insufficient documentation

## 2013-12-10 DIAGNOSIS — T1491 Suicide attempt: Secondary | ICD-10-CM

## 2013-12-10 DIAGNOSIS — T1491XA Suicide attempt, initial encounter: Secondary | ICD-10-CM

## 2013-12-10 HISTORY — DX: Suicide attempt, initial encounter: T14.91XA

## 2013-12-10 MED ORDER — TRETINOIN 0.05 % EX CREA: TOPICAL_CREAM | Freq: Every day | CUTANEOUS | Status: DC

## 2013-12-10 MED ORDER — TADALAFIL 20 MG PO TABS: 20.0000 mg | ORAL_TABLET | Freq: Every day | ORAL | Status: DC | PRN
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
## 2013-12-11 ENCOUNTER — Encounter: Payer: Self-pay | Admitting: Family Medicine

## 2013-12-11 DIAGNOSIS — L700 Acne vulgaris: Secondary | ICD-10-CM

## 2013-12-11 DIAGNOSIS — N529 Male erectile dysfunction, unspecified: Secondary | ICD-10-CM

## 2013-12-11 LAB — URINALYSIS, ROUTINE W REFLEX MICROSCOPIC
Bilirubin Urine: NEGATIVE
GLUCOSE, UA: NEGATIVE mg/dL
HGB URINE DIPSTICK: NEGATIVE
KETONES UR: NEGATIVE mg/dL
LEUKOCYTES UA: NEGATIVE
Nitrite: NEGATIVE
PH: 5.5 (ref 5.0–8.0)
Protein, ur: NEGATIVE mg/dL
Specific Gravity, Urine: 1.018 (ref 1.005–1.030)
Urobilinogen, UA: 0.2 mg/dL (ref 0.0–1.0)

## 2013-12-11 MED ORDER — TADALAFIL 20 MG PO TABS: 10.0000 mg | ORAL_TABLET | ORAL | Status: DC | PRN

## 2013-12-11 MED ORDER — ADAPALENE 0.1 % EX GEL: CUTANEOUS | Status: DC

## 2013-12-11 MED ORDER — SILDENAFIL CITRATE 100 MG PO TABS: 50.0000 mg | ORAL_TABLET | Freq: Every day | ORAL | Status: DC | PRN

## 2013-12-11 NOTE — Addendum Note (Signed)
Addended by: Marcial Pacas on: 12/11/2013 04:46 PM   Modules accepted: Orders, Medications

## 2013-12-15 ENCOUNTER — Encounter: Payer: Self-pay | Admitting: Family Medicine

## 2013-12-15 ENCOUNTER — Telehealth: Payer: Self-pay | Admitting: Family Medicine

## 2013-12-15 DIAGNOSIS — E291 Testicular hypofunction: Secondary | ICD-10-CM

## 2013-12-15 DIAGNOSIS — Z79899 Other long term (current) drug therapy: Secondary | ICD-10-CM

## 2013-12-15 HISTORY — DX: Testicular hypofunction: E29.1

## 2013-12-15 LAB — TESTOSTERONE: Testosterone: 352 ng/dL (ref 300–890)

## 2013-12-15 NOTE — Telephone Encounter (Signed)
Justin Ashley, Will you please let patient know that his testosterone was on the lower limit of normal and certainly can be contributing to his erectile dysfunction and/or depression.  If he's interested in starting testosterone replacement therapy I'd recommend he have a morning value rechecked in no sooner than one week along with a PSA prostate test via our lab.  (Lab slips in your inbox)

## 2013-12-15 NOTE — Telephone Encounter (Signed)
Pt.notified

## 2013-12-18 ENCOUNTER — Telehealth: Payer: Self-pay | Admitting: Family Medicine

## 2013-12-18 DIAGNOSIS — D473 Essential (hemorrhagic) thrombocythemia: Secondary | ICD-10-CM

## 2013-12-18 DIAGNOSIS — D75839 Thrombocytosis, unspecified: Secondary | ICD-10-CM

## 2013-12-18 DIAGNOSIS — R202 Paresthesia of skin: Secondary | ICD-10-CM

## 2013-12-18 NOTE — Telephone Encounter (Signed)
Patient request

## 2013-12-22 ENCOUNTER — Encounter: Payer: Self-pay | Admitting: Family Medicine

## 2013-12-22 LAB — PSA: PSA: 1.23 ng/mL (ref <=4.00)

## 2013-12-22 LAB — CBC
HEMATOCRIT: 44.3 % (ref 39.0–52.0)
HEMOGLOBIN: 15.6 g/dL (ref 13.0–17.0)
MCH: 31 pg (ref 26.0–34.0)
MCHC: 35.2 g/dL (ref 30.0–36.0)
MCV: 87.9 fL (ref 78.0–100.0)
Platelets: 561 10*3/uL — ABNORMAL HIGH (ref 150–400)
RBC: 5.04 MIL/uL (ref 4.22–5.81)
RDW: 14.3 % (ref 11.5–15.5)
WBC: 6.1 10*3/uL (ref 4.0–10.5)

## 2013-12-22 LAB — BASIC METABOLIC PANEL WITH GFR
BUN: 9 mg/dL (ref 6–23)
CO2: 24 meq/L (ref 19–32)
CREATININE: 0.93 mg/dL (ref 0.50–1.35)
Calcium: 9.7 mg/dL (ref 8.4–10.5)
Chloride: 104 mEq/L (ref 96–112)
GFR, Est African American: >89 mL/min
GFR, Est Non African American: >89 mL/min
GLUCOSE: 87 mg/dL (ref 70–99)
POTASSIUM: 4.3 meq/L (ref 3.5–5.3)
Sodium: 138 mEq/L (ref 135–145)

## 2013-12-22 LAB — TESTOSTERONE: Testosterone: 373 ng/dL (ref 300–890)

## 2013-12-23 ENCOUNTER — Ambulatory Visit (INDEPENDENT_AMBULATORY_CARE_PROVIDER_SITE_OTHER): Payer: BC Managed Care – PPO | Admitting: Family Medicine

## 2013-12-23 ENCOUNTER — Encounter: Payer: Self-pay | Admitting: Family Medicine

## 2013-12-23 VITALS — BP 142/97 | HR 82 | Wt 141.0 lb

## 2013-12-23 DIAGNOSIS — R030 Elevated blood-pressure reading, without diagnosis of hypertension: Secondary | ICD-10-CM

## 2013-12-23 DIAGNOSIS — IMO0001 Reserved for inherently not codable concepts without codable children: Secondary | ICD-10-CM

## 2013-12-23 DIAGNOSIS — E291 Testicular hypofunction: Secondary | ICD-10-CM

## 2013-12-23 MED ORDER — TESTOSTERONE CYPIONATE 200 MG/ML IM SOLN
200.0000 mg | Freq: Once | INTRAMUSCULAR | Status: AC
  Administered 2013-12-23: 300 mg via INTRAMUSCULAR

## 2013-12-23 NOTE — Progress Notes (Signed)
CC: Justin Ashley is a 38 y.o. male is here for discuss testosterone supplementation   Subjective: HPI:  Followup hypogonadism: He's had 2 testosterone checks in the low normal range accompanied by erectile dysfunction, low libido and mild to moderate fatigue a daily basis all of which has been present for at least a year now. He denies heavy alcohol use or any steroid use in the past. Denies any genitourinary complaints other than that described above. PSA and hemoglobin have been normal recently.  She's done some research on his candidacy for testosterone supplementation and has questions today. There's been no history of cardiovascular disease nor blood clots. No family history of prostate cancer   Review Of Systems Outlined In HPI  Past Medical History  Diagnosis Date  . Dislocation of metatarsophalangeal joint of right great toe   . Pneumonia age 89    No past surgical history on file. Family History  Problem Relation Age of Onset  . Cancer Mother     lung  . Cancer Father     stomach, deceased @  6yrs    History   Social History  . Marital Status: Single    Spouse Name: N/A    Number of Children: N/A  . Years of Education: N/A   Occupational History  . Not on file.   Social History Main Topics  . Smoking status: Current Every Day Smoker -- 22 years    Types: Cigarettes  . Smokeless tobacco: Never Used     Comment: e-cigs smoker x 3 years  . Alcohol Use: Not on file  . Drug Use: Not on file  . Sexual Activity: Yes   Other Topics Concern  . Not on file   Social History Narrative  . No narrative on file     Objective: BP 142/97  Pulse 82  Wt 141 lb (63.957 kg)  Vital signs reviewed. General: Alert and Oriented, No Acute Distress HEENT: Pupils equal, round, reactive to light. Conjunctivae clear.  External ears unremarkable.  Moist mucous membranes. Lungs: Clear and comfortable work of breathing, speaking in full sentences without accessory muscle  use. Cardiac: Regular rate and rhythm.  Neuro: CN II-XII grossly intact, gait normal. Extremities: No peripheral edema.  Strong peripheral pulses.  Mental Status: No depression, anxiety, nor agitation. Logical though process. Skin: Warm and dry.  Assessment & Plan: Justin Ashley was seen today for discuss testosterone supplementation.  Diagnoses and associated orders for this visit:  Hypogonadism in male  Elevated blood pressure    Hypogonadism: Discussed risks and benefits of both topical and injectable testosterone therapy. He has fears that topical therapy could be exposed to his children or sexual partners. He's interested in injectable therapy every 3 weeks. Discussed with him that he'll need to return sometime in January for a office visit to obtain hemoglobin, PSA, testosterone levels. Time was taken to answer all his questions regarding how long he'll need to take this medication, if he needs to take an aromatase inhibitor, and risks and benefits.  Please note that he had [place] food last night heavily loaded with soy sauce. We'll recheck blood pressures at nurse visit  25 minutes spent face-to-face during visit today of which at least 50% was counseling or coordinating care regarding: 1. Hypogonadism in male   2. Elevated blood pressure       Return in about 3 weeks (around 01/13/2014) for Nurse Visit Testosterone Injection.

## 2013-12-24 ENCOUNTER — Telehealth: Payer: Self-pay | Admitting: Family Medicine

## 2013-12-24 NOTE — Telephone Encounter (Signed)
Perfect, thanks 

## 2013-12-24 NOTE — Telephone Encounter (Signed)
Seth Bake, Do you recall if Mr. Whetsel received 200 or 300mg  of testosterone on Wedensday?  I had intended him to receive 300mg  as listed in his medication signature.  He does not need to return if he only received 200mg , I just want to make sure it's documented correctly.

## 2013-12-24 NOTE — Telephone Encounter (Signed)
I did give him 300. My error. I went back and did an addendum and changed the dose

## 2013-12-31 ENCOUNTER — Encounter: Payer: Self-pay | Admitting: Family Medicine

## 2014-01-12 ENCOUNTER — Ambulatory Visit (INDEPENDENT_AMBULATORY_CARE_PROVIDER_SITE_OTHER): Payer: BC Managed Care – PPO | Admitting: Family Medicine

## 2014-01-12 VITALS — BP 121/81 | HR 82

## 2014-01-12 DIAGNOSIS — E291 Testicular hypofunction: Secondary | ICD-10-CM

## 2014-01-12 MED ORDER — TESTOSTERONE CYPIONATE 200 MG/ML IM SOLN
300.0000 mg | Freq: Once | INTRAMUSCULAR | Status: AC
  Administered 2014-01-12: 300 mg via INTRAMUSCULAR

## 2014-01-12 NOTE — Progress Notes (Signed)
I was present or all necessary aspects of today's encounter

## 2014-01-13 ENCOUNTER — Ambulatory Visit: Payer: BC Managed Care – PPO | Admitting: Family Medicine

## 2014-02-02 ENCOUNTER — Ambulatory Visit (INDEPENDENT_AMBULATORY_CARE_PROVIDER_SITE_OTHER): Payer: BC Managed Care – PPO | Admitting: Family Medicine

## 2014-02-02 VITALS — BP 139/71 | HR 88 | Wt 158.0 lb

## 2014-02-02 DIAGNOSIS — E291 Testicular hypofunction: Secondary | ICD-10-CM | POA: Diagnosis not present

## 2014-02-02 MED ORDER — TESTOSTERONE CYPIONATE 200 MG/ML IM SOLN
300.0000 mg | Freq: Once | INTRAMUSCULAR | Status: AC
  Administered 2014-02-02: 300 mg via INTRAMUSCULAR

## 2014-02-02 NOTE — Progress Notes (Signed)
   Subjective:    Patient ID: Justin Ashley, male    DOB: 08/22/1975, 38 y.o.   MRN: [id number]  HPI  Justin Ashley is here for a testosterone injection. Denies chest pain, shortness of breath, headaches or mood changes.   Review of Systems     Objective:   Physical Exam        Assessment & Plan:  Patient tolerated injection well without complications. Patient advised to schedule next injection 21 days from today.  

## 2014-02-23 ENCOUNTER — Ambulatory Visit (INDEPENDENT_AMBULATORY_CARE_PROVIDER_SITE_OTHER): Payer: BC Managed Care – PPO | Admitting: Family Medicine

## 2014-02-23 VITALS — BP 139/77 | HR 97 | Wt 164.0 lb

## 2014-02-23 DIAGNOSIS — E291 Testicular hypofunction: Secondary | ICD-10-CM

## 2014-02-23 MED ORDER — TESTOSTERONE CYPIONATE 200 MG/ML IM SOLN
300.0000 mg | Freq: Once | INTRAMUSCULAR | Status: AC
  Administered 2014-02-23: 300 mg via INTRAMUSCULAR

## 2014-02-23 NOTE — Progress Notes (Signed)
   Subjective:    Patient ID: Justin Ashley, male    DOB: 09/04/1975, 38 y.o.   MRN: [id number]  HPI  Takahiro Godinho is here for a testosterone injection. Denies chest pain, shortness of breath, headaches or mood changes.    Review of Systems     Objective:   Physical Exam        Assessment & Plan:  Patient tolerated injection well without complications. Patient advised to schedule next injection 21 days from today. He will be due for labs around the 15 th of January.

## 2014-03-16 ENCOUNTER — Ambulatory Visit (INDEPENDENT_AMBULATORY_CARE_PROVIDER_SITE_OTHER): Payer: BLUE CROSS/BLUE SHIELD | Admitting: Family Medicine

## 2014-03-16 ENCOUNTER — Other Ambulatory Visit: Payer: Self-pay | Admitting: *Deleted

## 2014-03-16 VITALS — BP 128/84 | HR 65 | Ht 68.0 in | Wt 168.0 lb

## 2014-03-16 DIAGNOSIS — E291 Testicular hypofunction: Secondary | ICD-10-CM | POA: Diagnosis not present

## 2014-03-16 MED ORDER — TESTOSTERONE CYPIONATE 200 MG/ML IM SOLN
300.0000 mg | Freq: Once | INTRAMUSCULAR | Status: AC
  Administered 2014-03-16: 300 mg via INTRAMUSCULAR

## 2014-03-16 MED ORDER — TESTOSTERONE CYPIONATE 200 MG/ML IM SOLN: 300.0000 mg | Freq: Once | INTRAMUSCULAR | Status: DC

## 2014-03-16 NOTE — Progress Notes (Signed)
   Subjective:    Patient ID: Justin Ashley, male    DOB: 1975/10/13, 39 y.o.   MRN: [id number]  HPI  Justin Ashley reported to office today for scheduled testosterone injection which he received without complication. He denies CP, SOB, headaches or mood swings. No other concerns at this time.    Review of Systems     Objective:   Physical Exam        Assessment & Plan:

## 2014-03-25 LAB — TESTOSTERONE: Testosterone: 530 ng/dL (ref 300–890)

## 2014-04-07 ENCOUNTER — Ambulatory Visit (INDEPENDENT_AMBULATORY_CARE_PROVIDER_SITE_OTHER): Payer: BLUE CROSS/BLUE SHIELD | Admitting: Family Medicine

## 2014-04-07 VITALS — BP 124/77 | HR 93 | Resp 16 | Wt 171.0 lb

## 2014-04-07 DIAGNOSIS — E291 Testicular hypofunction: Secondary | ICD-10-CM | POA: Diagnosis not present

## 2014-04-07 MED ORDER — TESTOSTERONE CYPIONATE 200 MG/ML IM SOLN
300.0000 mg | Freq: Once | INTRAMUSCULAR | Status: AC
  Administered 2014-04-07: 300 mg via INTRAMUSCULAR

## 2014-04-07 NOTE — Progress Notes (Signed)
   Subjective:    Patient ID: Justin Ashley, male    DOB: 1975-12-23, 39 y.o.   MRN: [id number]  HPI Patient is here for a testosterone injection. Denies chest pain, shortness of breath, mood changes and headaches.   Review of Systems     Objective:   Physical Exam        Assessment & Plan:  Patient tolerated injection well without complications. Patient advised to schedule next injection for 3 weeks from today.

## 2014-04-19 ENCOUNTER — Encounter: Payer: Self-pay | Admitting: Family Medicine

## 2014-04-27 ENCOUNTER — Ambulatory Visit (INDEPENDENT_AMBULATORY_CARE_PROVIDER_SITE_OTHER): Payer: BLUE CROSS/BLUE SHIELD | Admitting: Family Medicine

## 2014-04-27 VITALS — BP 139/85 | HR 85 | Resp 16 | Wt 174.0 lb

## 2014-04-27 DIAGNOSIS — E291 Testicular hypofunction: Secondary | ICD-10-CM | POA: Diagnosis not present

## 2014-04-27 MED ORDER — TESTOSTERONE CYPIONATE 200 MG/ML IM SOLN
200.0000 mg | Freq: Once | INTRAMUSCULAR | Status: AC
  Administered 2014-04-27: 200 mg via INTRAMUSCULAR

## 2014-04-27 NOTE — Progress Notes (Signed)
    Subjective:    Patient ID: Justin Ashley, male    DOB: 1975-04-18, 39 y.o.   MRN: [id number]  HPI Patient is here for testosterone injection. Denies chest pain, shortness of breath, headaches or mood changes.     Review of Systems     Objective:   Physical Exam        Assessment & Plan:  Patient tolerated injection well without complications. Patient advised to schedule his next injection for 3 weeks.

## 2014-05-19 ENCOUNTER — Ambulatory Visit (INDEPENDENT_AMBULATORY_CARE_PROVIDER_SITE_OTHER): Payer: BLUE CROSS/BLUE SHIELD | Admitting: Family Medicine

## 2014-05-19 VITALS — BP 138/85 | HR 97

## 2014-05-19 DIAGNOSIS — E291 Testicular hypofunction: Secondary | ICD-10-CM

## 2014-05-19 MED ORDER — TESTOSTERONE CYPIONATE 200 MG/ML IM SOLN
300.0000 mg | Freq: Once | INTRAMUSCULAR | Status: AC
  Administered 2014-05-19: 300 mg via INTRAMUSCULAR

## 2014-05-19 NOTE — Progress Notes (Signed)
   Subjective:    Patient ID: Justin Ashley, male    DOB: 1975/12/11, 39 y.o.   MRN: [id number]  HPI  Justin Ashley is here for a testosterone injection. Denies chest pain, shortness of breath, headaches or mood changes.   Review of Systems     Objective:   Physical Exam        Assessment & Plan:  Patient tolerated injection well without complications. Patient advised to schedule next injection 21 days from today.

## 2014-06-09 ENCOUNTER — Ambulatory Visit (INDEPENDENT_AMBULATORY_CARE_PROVIDER_SITE_OTHER): Payer: BLUE CROSS/BLUE SHIELD | Admitting: Family Medicine

## 2014-06-09 VITALS — BP 152/94 | HR 97 | Ht 69.0 in | Wt 178.0 lb

## 2014-06-09 DIAGNOSIS — E291 Testicular hypofunction: Secondary | ICD-10-CM

## 2014-06-09 MED ORDER — TESTOSTERONE CYPIONATE 200 MG/ML IM SOLN
300.0000 mg | Freq: Once | INTRAMUSCULAR | Status: AC
  Administered 2014-06-09: 300 mg via INTRAMUSCULAR

## 2014-06-09 NOTE — Progress Notes (Signed)
   Subjective:    Patient ID: Justin Ashley, male    DOB: Oct 17, 1975, 39 y.o.   MRN: [id number]  HPI Patient came into office today for testosterone injection. Denies chest pain, shortness of breath, headaches and problems associated with taking this medication. Patient states he has had no abnornal mood swings. Patient's blood pressure was slightly elevated today, states he has had "pre-hypertension" for a while. Advised Pt to decrease the sodium in his diet. Verbalized understanding.    Review of Systems     Objective:   Physical Exam        Assessment & Plan:  Patient tolerated injection in [place] well without complications. Patient advised to schedule his next injection for 3 weeks from today, we will set up with [facility] for routine visit and to see if Pt can begin giving himself his own injections at home.

## 2014-06-30 ENCOUNTER — Encounter: Payer: Self-pay | Admitting: Family Medicine

## 2014-06-30 ENCOUNTER — Ambulatory Visit (INDEPENDENT_AMBULATORY_CARE_PROVIDER_SITE_OTHER): Payer: BLUE CROSS/BLUE SHIELD | Admitting: Family Medicine

## 2014-06-30 VITALS — BP 146/90 | HR 94 | Wt 172.0 lb

## 2014-06-30 DIAGNOSIS — E291 Testicular hypofunction: Secondary | ICD-10-CM

## 2014-06-30 DIAGNOSIS — I10 Essential (primary) hypertension: Secondary | ICD-10-CM | POA: Insufficient documentation

## 2014-06-30 HISTORY — DX: Essential (primary) hypertension: I10

## 2014-06-30 MED ORDER — TESTOSTERONE CYPIONATE 200 MG/ML IM SOLN: 200.0000 mg | INTRAMUSCULAR | Status: DC

## 2014-06-30 MED ORDER — TESTOSTERONE CYPIONATE 200 MG/ML IM SOLN
200.0000 mg | Freq: Once | INTRAMUSCULAR | Status: AC
  Administered 2014-06-30: 200 mg via INTRAMUSCULAR

## 2014-06-30 MED ORDER — AMBULATORY NON FORMULARY MEDICATION: Status: AC

## 2014-06-30 NOTE — Progress Notes (Signed)
CC: Justin Ashley is a 39 y.o. male is here for Hypertension and testosterone injection   Subjective: HPI:  Follow-up hypogonadism: He's been receiving 300 mg of testosterone every 3 weeks. He tells me on the third week between injections he feels is slowly worsening fatigue until he gets his next injection and this disappears. He wants know if he can take a different regimen of this medication. He tells me that he still feels like he is getting a benefit from mood, energy and lack of erectile dysfunction since starting this medication without any known side effects. He wants know if he can also administer this at home.  Follow-up elevated blood pressure: He's been taking his blood pressure at home and values are normotensive when he wakes up first thing in the morning. If he checks it in the evening systolic will range in the 1:30-140s. He is working out on a daily basis. Denies chest pain shortness of breath orthopnea nor peripheral edema. Has never been on any blood pressure medication in the past.   Review Of Systems Outlined In HPI  Past Medical History  Diagnosis Date  . Dislocation of metatarsophalangeal joint of right great toe   . Pneumonia age 37    No past surgical history on file. Family History  Problem Relation Age of Onset  . Cancer Mother     lung  . Cancer Father     stomach, deceased @  54yrs    History   Social History  . Marital Status: Single    Spouse Name: N/A  . Number of Children: N/A  . Years of Education: N/A   Occupational History  . Not on file.   Social History Main Topics  . Smoking status: Current Every Day Smoker -- 22 years    Types: Cigarettes  . Smokeless tobacco: Never Used     Comment: e-cigs smoker x 3 years  . Alcohol Use: Not on file  . Drug Use: Not on file  . Sexual Activity: Yes   Other Topics Concern  . Not on file   Social History Narrative     Objective: BP 146/90 mmHg  Pulse 94  Wt 172 lb (78.019 kg)    General:  Alert and Oriented, No Acute Distress HEENT: Pupils equal, round, reactive to light. Conjunctivae clear.  Moist mucous membranes Lungs: Clear to auscultation bilaterally, no wheezing/ronchi/rales.  Comfortable work of breathing. Good air movement. Cardiac: Regular rate and rhythm. Normal S1/S2.  No murmurs, rubs, nor gallops.   Extremities: No peripheral edema.  Strong peripheral pulses.  Mental Status: No depression, anxiety, nor agitation. Skin: Warm and dry.  Assessment & Plan: Justin Ashley was seen today for hypertension and testosterone injection.  Diagnoses and all orders for this visit:  Hypogonadism in male Orders: -     testosterone cypionate (DEPOTESTOTERONE CYPIONATE) 200 MG/ML injection; Inject 1 mL (200 mg total) into the muscle every 14 (fourteen) days.  Essential hypertension  Other orders -     AMBULATORY NON FORMULARY MEDICATION; BD 12mL syringe and BD 22G 1 and 1/2" needle, use to inject testosterone every two weeks.  BD 18G 1 and 1/2" needle used to draw up testosterone. Dx: Hypogonadism   Hypogonadism: Overall controlled, we will change the frequency of his injections to every other week at a lower dose of 200 mg per injection. He was provided with instructions on how to inject himself, this was witnessed by Seth Bake, and he was given prescriptions for supplies to do this at  home every 2 weeks. Essential hypertension: Uncontrolled chronic condition, given the uncertainty of his blood pressures at home have asked him to give me a daily journal of blood pressures in the morning and afternoon and evening and provided to me either electronically or in writing in 7 days to determine if he needs to start antihypertensive medication.  Return for Drop off or send in BP log covering seven days: Morning, Noon, Night.

## 2014-07-01 ENCOUNTER — Telehealth: Payer: Self-pay | Admitting: Family Medicine

## 2014-07-01 NOTE — Telephone Encounter (Signed)
Received fax for prior auth on Testosterone CYP 200 sent through cover my meds and faxed Testosterone levels to [facility] waiting on auth. - CF

## 2014-07-01 NOTE — Telephone Encounter (Signed)
Received fax from [employer] filled out form had Dr. Ileene Rubens sign and I faxed it back waiting on auth. - CF

## 2014-07-06 ENCOUNTER — Telehealth: Payer: Self-pay | Admitting: Family Medicine

## 2014-07-06 NOTE — Telephone Encounter (Signed)
Received fax from [facility] and Testosterone Cypionate is denied due to normal testosterone levels. - CF

## 2014-07-06 NOTE — Telephone Encounter (Signed)
Seth Bake, Will you please let patient know that [employer] has denied coverage for his testosterone because they felt his levels were not low enough to begin with.  They have declined our appeal.  He will have to either continue injections here or pay for this medicaiton out of pocket.  I'm told that [employer] and [employer] have the cheapest cost out of pocket.

## 2014-07-07 NOTE — Telephone Encounter (Signed)
Pt notified and he states he may just continue to give himself the injections

## 2014-07-09 ENCOUNTER — Encounter: Payer: Self-pay | Admitting: Family Medicine

## 2014-08-27 ENCOUNTER — Encounter: Payer: Self-pay | Admitting: Family Medicine

## 2014-10-11 ENCOUNTER — Ambulatory Visit (INDEPENDENT_AMBULATORY_CARE_PROVIDER_SITE_OTHER): Payer: BLUE CROSS/BLUE SHIELD | Admitting: Sports Medicine

## 2014-10-11 ENCOUNTER — Ambulatory Visit (INDEPENDENT_AMBULATORY_CARE_PROVIDER_SITE_OTHER): Payer: BLUE CROSS/BLUE SHIELD

## 2014-10-11 ENCOUNTER — Encounter: Payer: Self-pay | Admitting: Sports Medicine

## 2014-10-11 VITALS — BP 138/83 | HR 83 | Ht 68.0 in | Wt 178.0 lb

## 2014-10-11 DIAGNOSIS — R0602 Shortness of breath: Secondary | ICD-10-CM | POA: Diagnosis not present

## 2014-10-11 DIAGNOSIS — R079 Chest pain, unspecified: Secondary | ICD-10-CM

## 2014-10-11 DIAGNOSIS — Z720 Tobacco use: Secondary | ICD-10-CM | POA: Insufficient documentation

## 2014-10-11 DIAGNOSIS — Z789 Other specified health status: Principal | ICD-10-CM

## 2014-10-11 IMAGING — CR DG CHEST 2V
2 series · 2 of 2 positions shown · non-contrast
Comparison: Chest x-ray of [DATE], and [DATE].

CLINICAL DATA: Chest pain and shortness of breath for the past 3
days, long-term history of tobacco use

EXAM:
CHEST  2 VIEW

[chest pa]
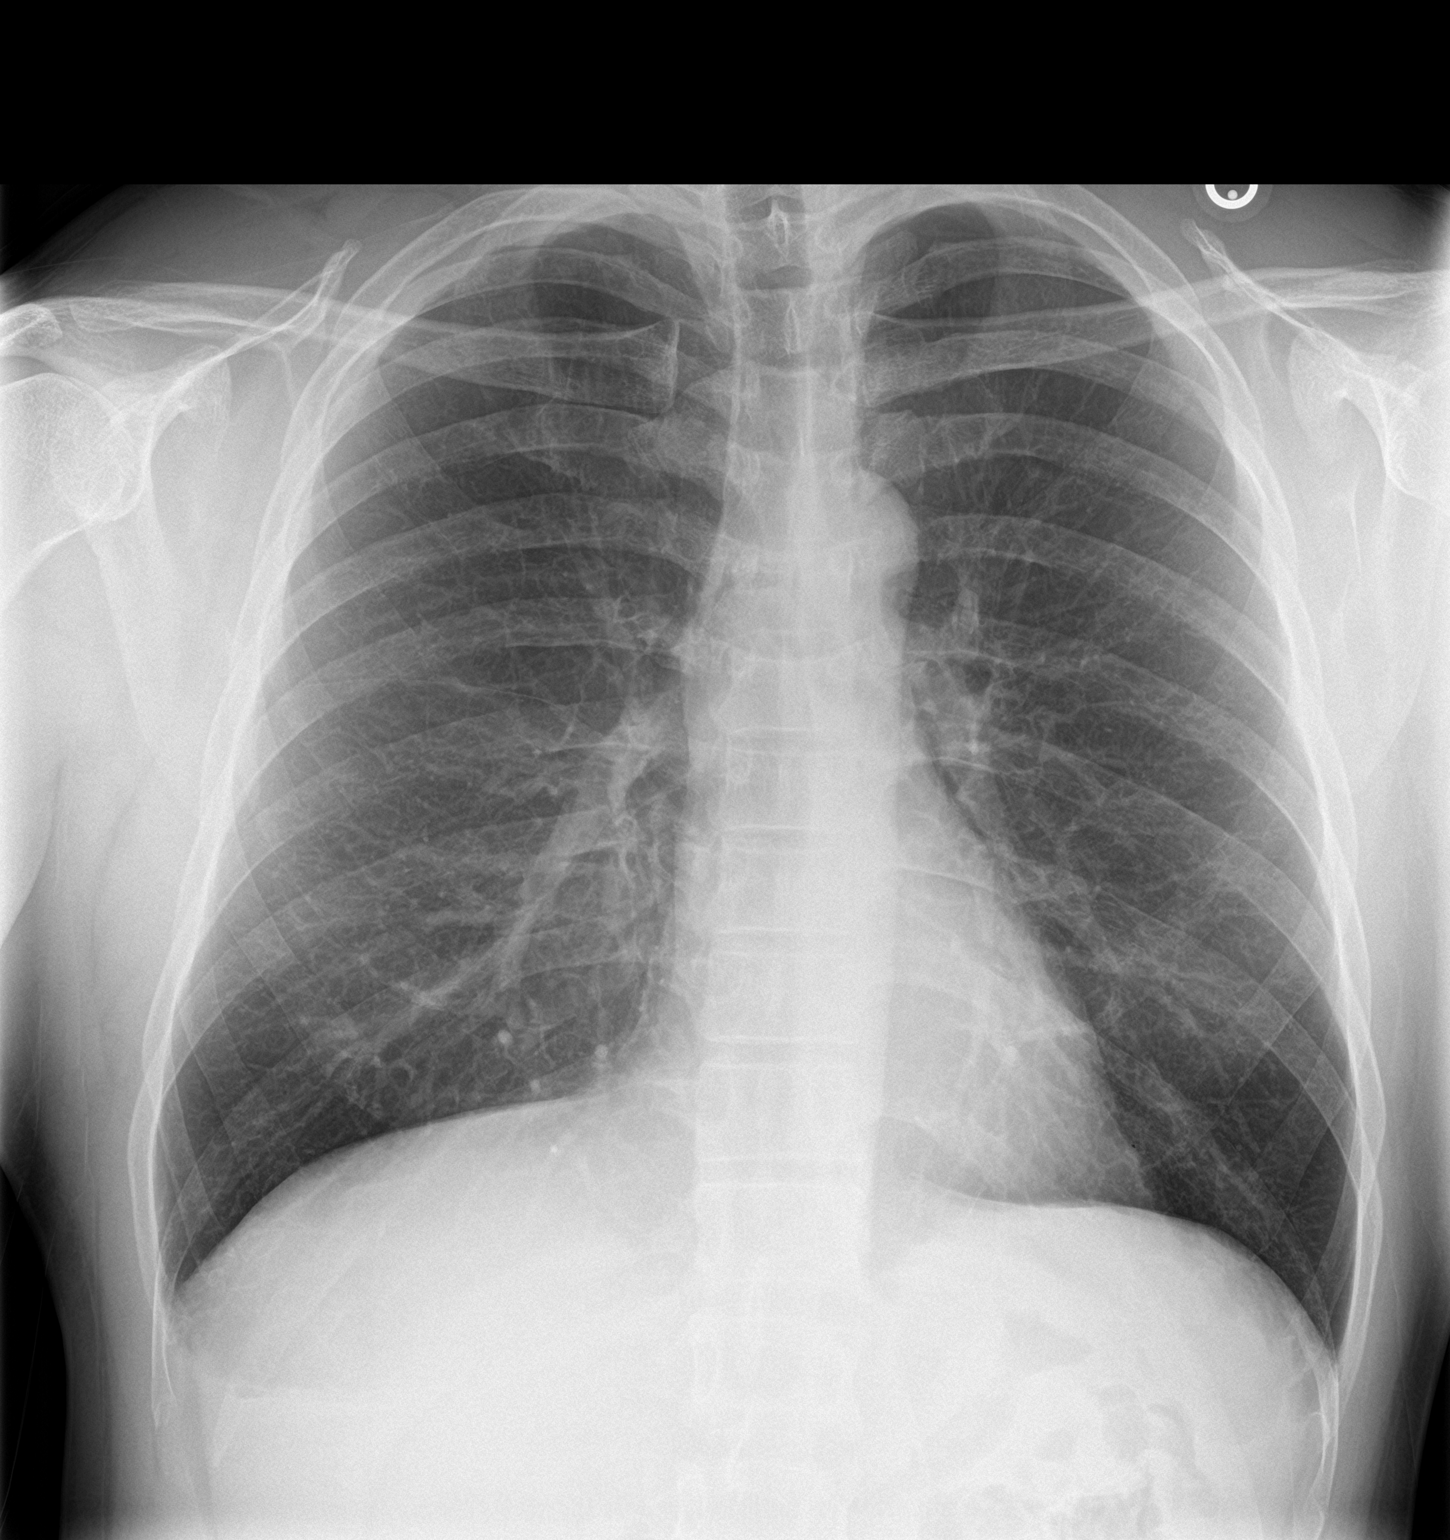

[chest lat]
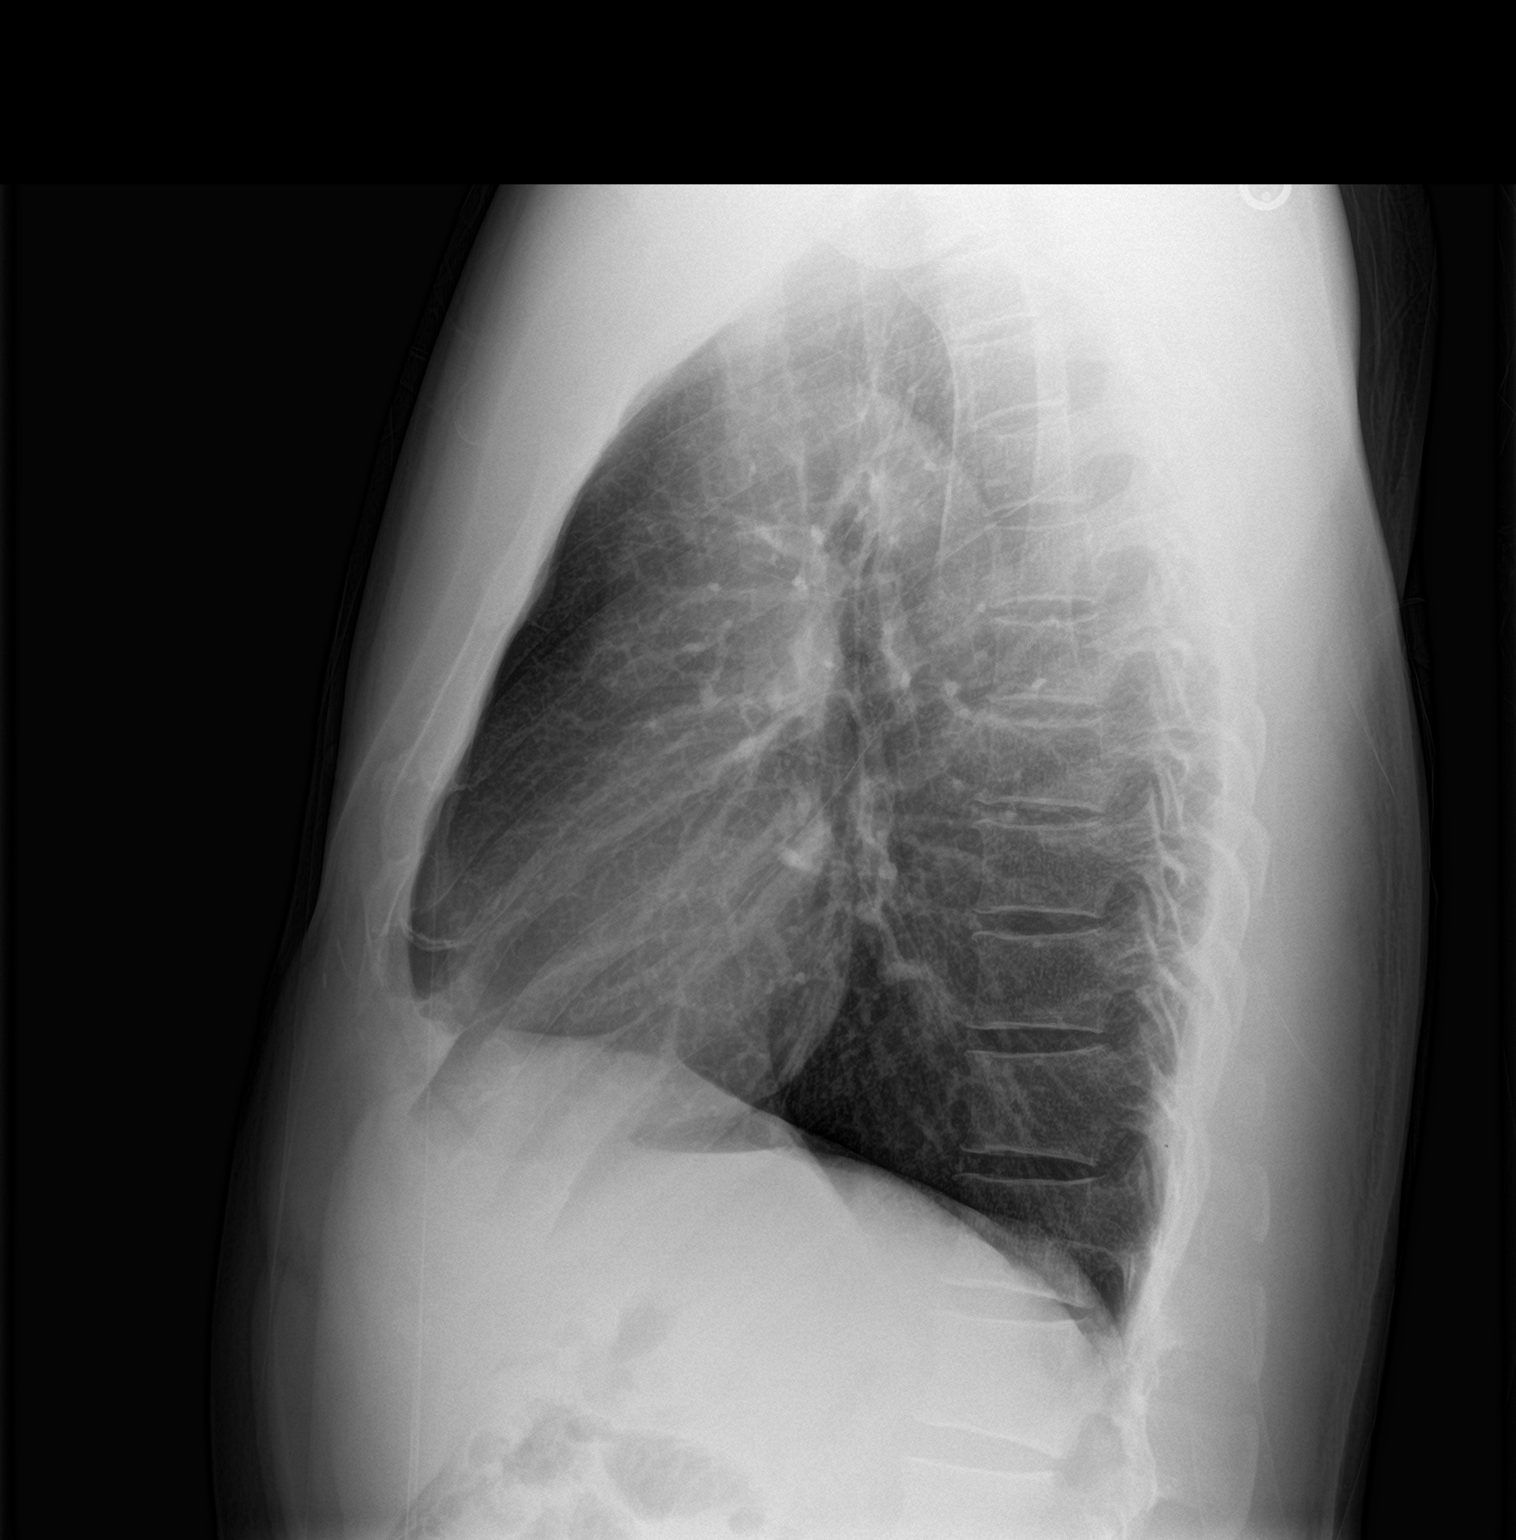

[2 of 2 positions shown; findings below may reference images not displayed]

FINDINGS: The lungs are mildly hyperinflated but clear. The heart and
pulmonary vascularity are normal. The mediastinum is normal in
width. There is no pleural effusion. The bony thorax is
unremarkable.
IMPRESSION: There is no active cardiopulmonary disease.

## 2014-10-11 MED ORDER — AZITHROMYCIN 250 MG PO TABS: ORAL_TABLET | ORAL | Status: DC

## 2014-10-11 MED ORDER — PREDNISONE 50 MG PO TABS: 50.0000 mg | ORAL_TABLET | Freq: Every day | ORAL | Status: DC

## 2014-10-11 MED ORDER — FLUTICASONE PROPIONATE 50 MCG/ACT NA SUSP: NASAL | Status: DC

## 2014-10-11 NOTE — Progress Notes (Signed)
  Subjective:    CC: ER follow-up  HPI: This is a pleasant 39 year old male, several days ago he was playing basketball with his son, he did well but had some tightness in his throat, later that night at 3 AM he felt some degree of shortness of breath, with nasal congestion, he was afraid that he was having a heart attack and was driven to the emergency department, ultimately he had a great deal of blood work, ECG, chest x-ray and CT angiogram all of which were negative with the exception of an elevated white blood cell count, he went to the ER again, but was referred to [place] for further evaluation and definitive treatment. His symptoms have improved significantly but he does have some sinus pressure, and minimal cough/shortness of breath. He did not have any symptoms, no chest pain, diaphoresis, nausea during basketball, no trauma.  Past medical history, Surgical history, Family history not pertinant except as noted below, Social history, Allergies, and medications have been entered into the medical record, reviewed, and no changes needed.   Review of Systems: No fevers, chills, night sweats, weight loss, chest pain, or shortness of breath.   Objective:    General: Well Developed, well nourished, and in no acute distress.  Neuro: Alert and oriented x3, extra-ocular muscles intact, sensation grossly intact.  HEENT: Normocephalic, atraumatic, pupils equal round reactive to light, neck supple, no masses, no lymphadenopathy, thyroid nonpalpable.  Skin: Warm and dry, no rashes. Cardiac: Regular rate and rhythm, no murmurs rubs or gallops, no lower extremity edema.  Respiratory: Coarse and bronchitic sounds particularly audible in the left lung fields. Not using accessory muscles, speaking in full sentences.  Impression and Recommendations:

## 2014-10-11 NOTE — Assessment & Plan Note (Signed)
With a negative CT angiogram and chest x-ray the emergency department. There was some evidence of sinusitis, he also has some coarse and bronchitic left lung sounds. Repeat chest x-ray to see if an infiltrate has fluffed out, prednisone, azithromycin, fluticasone nasal, return to see me in 2 weeks, we will probably get a pre-and postbronchodilator spirometry if no better, he does have a decades long history of smoking.

## 2014-10-18 ENCOUNTER — Ambulatory Visit: Payer: BLUE CROSS/BLUE SHIELD | Admitting: Family Medicine

## 2014-10-25 ENCOUNTER — Ambulatory Visit (INDEPENDENT_AMBULATORY_CARE_PROVIDER_SITE_OTHER): Payer: BLUE CROSS/BLUE SHIELD | Admitting: Family Medicine

## 2014-10-25 ENCOUNTER — Encounter: Payer: Self-pay | Admitting: Family Medicine

## 2014-10-25 VITALS — BP 154/82 | HR 96 | Ht 68.0 in | Wt 178.0 lb

## 2014-10-25 DIAGNOSIS — E291 Testicular hypofunction: Secondary | ICD-10-CM

## 2014-10-25 DIAGNOSIS — R0602 Shortness of breath: Secondary | ICD-10-CM | POA: Diagnosis not present

## 2014-10-25 MED ORDER — TESTOSTERONE CYPIONATE 200 MG/ML IM SOLN
200.0000 mg | INTRAMUSCULAR | Status: DC
Start: 2014-10-25 — End: 2015-01-28

## 2014-10-25 MED ORDER — AMBULATORY NON FORMULARY MEDICATION: Status: DC

## 2014-10-25 NOTE — Progress Notes (Signed)
CC: Justin Ashley is a 39 y.o. male is here for Follow-up   Subjective: HPI:  FU SOB that occurred on the 30th of July.  Saw Dr. Darene Ashley two weeks ago and was given five day course of a ZPack and prednisone.  He also started using albuterol given to him by the ED.  Albuterol seemed to help for a few hours.  On the  Third day of the oral medication  He noticed he was no longer needing albuterol.  Shortness of breath was significantly reduced on this third day. He tells me now  He no longer has any shortness of breath. He tells me he feels like he  Is in his regular state of health. He denies any facial pressure, nasal congestion, cough, shortness of breath, wheezing, no chest pain.   He would like a refill of testosterone.   blood pressures outside of our office are consistently below 140/90.  Review Of Systems Outlined In HPI  Past Medical History  Diagnosis Date  . Dislocation of metatarsophalangeal joint of right great toe   . Pneumonia age 25    No past surgical history on file. Family History  Problem Relation Age of Onset  . Cancer Mother     lung  . Cancer Father     stomach, deceased @  9yrs    Social History   Social History  . Marital Status: Single    Spouse Name: N/A  . Number of Children: N/A  . Years of Education: N/A   Occupational History  . Not on file.   Social History Main Topics  . Smoking status: Current Every Day Smoker -- 22 years    Types: Cigarettes  . Smokeless tobacco: Never Used     Comment: e-cigs smoker x 3 years  . Alcohol Use: Not on file  . Drug Use: Not on file  . Sexual Activity: Yes   Other Topics Concern  . Not on file   Social History Narrative     Objective: BP 154/82 mmHg  Pulse 96  Ht 5\' 8"  (1.727 m)  Wt 178 lb (80.74 kg)  BMI 27.07 kg/m2  General: Alert and Oriented, No Acute Distress HEENT: Pupils equal, round, reactive to light. Conjunctivae clear.   Moist mucous membranes Lungs: Clear to auscultation bilaterally, no  wheezing/ronchi/rales.  Comfortable work of breathing. Good air movement. Cardiac: Regular rate and rhythm. Extremities: No peripheral edema.  Strong peripheral pulses.  Mental Status: No depression, anxiety, nor agitation. Skin: Warm and dry.  Assessment & Plan: Justin Ashley was seen today for follow-up.  Diagnoses and all orders for this visit:  SOB (shortness of breath) -     AMBULATORY NON FORMULARY MEDICATION; ProAir RespiClick.  Inhale two puffs every four hours only as needed for shortness of breath or wheezing. Use savings voucher.  Hypogonadism in male -     testosterone cypionate (DEPOTESTOSTERONE CYPIONATE) 200 MG/ML injection; Inject 1 mL (200 mg total) into the muscle every 14 (fourteen) days.    shortness of breath: Resolved, discussed  He may be showing signs of reactive airway disease or even  Early stages of COPD.  Urged to continue avoidance of all tobacco smoke.  Albuterol refill provided in case he needs this in the future.  Hypogonadism: Stable with testosterone.  Return if symptoms worsen or fail to improve.

## 2015-01-27 ENCOUNTER — Encounter: Payer: Self-pay | Admitting: Family Medicine

## 2015-01-27 DIAGNOSIS — E291 Testicular hypofunction: Secondary | ICD-10-CM

## 2015-01-28 ENCOUNTER — Telehealth: Payer: Self-pay | Admitting: Family Medicine

## 2015-01-28 DIAGNOSIS — E291 Testicular hypofunction: Secondary | ICD-10-CM

## 2015-01-28 LAB — TESTOSTERONE: Testosterone: 500 ng/dL (ref 300–890)

## 2015-01-28 MED ORDER — TESTOSTERONE CYPIONATE 200 MG/ML IM SOLN
240.0000 mg | INTRAMUSCULAR | Status: DC
Start: 2015-01-28 — End: 2015-10-11

## 2015-01-28 NOTE — Telephone Encounter (Signed)
Will you please let patient know that his testosterone level is in the normal range but far enough from the upper level of normal where he can increase his testosterone dose to 240mg  every other week. A new Rx is in your in box.

## 2015-01-28 NOTE — Telephone Encounter (Signed)
Awaiting call back.

## 2015-01-28 NOTE — Telephone Encounter (Signed)
Pt.notified

## 2015-03-14 ENCOUNTER — Encounter: Payer: Self-pay | Admitting: Family Medicine

## 2015-03-15 MED ORDER — LISINOPRIL 10 MG PO TABS: 10.0000 mg | ORAL_TABLET | Freq: Every day | ORAL | Status: DC

## 2015-03-18 ENCOUNTER — Encounter: Payer: Self-pay | Admitting: Family Medicine

## 2015-03-18 ENCOUNTER — Ambulatory Visit (INDEPENDENT_AMBULATORY_CARE_PROVIDER_SITE_OTHER): Payer: BLUE CROSS/BLUE SHIELD | Admitting: Family Medicine

## 2015-03-18 VITALS — BP 122/82 | HR 89 | Wt 169.0 lb

## 2015-03-18 DIAGNOSIS — I10 Essential (primary) hypertension: Secondary | ICD-10-CM

## 2015-03-18 DIAGNOSIS — F410 Panic disorder [episodic paroxysmal anxiety] without agoraphobia: Secondary | ICD-10-CM

## 2015-03-18 MED ORDER — VARDENAFIL HCL 10 MG PO TABS: 10.0000 mg | ORAL_TABLET | Freq: Every day | ORAL | Status: DC | PRN

## 2015-03-18 MED ORDER — LISINOPRIL 10 MG PO TABS: 5.0000 mg | ORAL_TABLET | Freq: Every day | ORAL | Status: DC

## 2015-03-18 MED ORDER — HYDROXYZINE HCL 50 MG PO TABS
50.0000 mg | ORAL_TABLET | Freq: Two times a day (BID) | ORAL | Status: DC | PRN
Start: 2015-03-18 — End: 2015-06-14
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
## 2015-03-22 ENCOUNTER — Encounter: Payer: Self-pay | Admitting: Family Medicine

## 2015-03-23 MED ORDER — TADALAFIL 20 MG PO TABS: 10.0000 mg | ORAL_TABLET | ORAL | Status: DC | PRN

## 2015-04-16 ENCOUNTER — Encounter: Payer: Self-pay | Admitting: Family Medicine

## 2015-04-18 MED ORDER — LOSARTAN POTASSIUM 50 MG PO TABS: 50.0000 mg | ORAL_TABLET | Freq: Every day | ORAL | Status: DC

## 2015-05-26 ENCOUNTER — Encounter: Payer: Self-pay | Admitting: Family Medicine

## 2015-06-14 ENCOUNTER — Encounter: Payer: Self-pay | Admitting: Family Medicine

## 2015-06-14 ENCOUNTER — Ambulatory Visit (INDEPENDENT_AMBULATORY_CARE_PROVIDER_SITE_OTHER): Payer: BLUE CROSS/BLUE SHIELD | Admitting: Family Medicine

## 2015-06-14 VITALS — BP 114/79 | HR 84 | Wt 174.0 lb

## 2015-06-14 DIAGNOSIS — I10 Essential (primary) hypertension: Secondary | ICD-10-CM

## 2015-06-14 DIAGNOSIS — F410 Panic disorder [episodic paroxysmal anxiety] without agoraphobia: Secondary | ICD-10-CM

## 2015-06-14 MED ORDER — HYDROXYZINE HCL 25 MG PO TABS
25.0000 mg | ORAL_TABLET | Freq: Every day | ORAL | Status: DC
Start: 2015-06-14 — End: 2016-05-02
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
## 2015-06-15 LAB — TSH: TSH: 0.79 m[IU]/L (ref 0.40–4.50)

## 2015-07-31 DIAGNOSIS — H524 Presbyopia: Secondary | ICD-10-CM | POA: Diagnosis not present

## 2015-10-10 ENCOUNTER — Encounter: Payer: Self-pay | Admitting: Family Medicine

## 2015-10-11 ENCOUNTER — Other Ambulatory Visit: Payer: Self-pay

## 2015-10-11 DIAGNOSIS — R0602 Shortness of breath: Secondary | ICD-10-CM

## 2015-10-11 DIAGNOSIS — E291 Testicular hypofunction: Secondary | ICD-10-CM

## 2015-10-11 MED ORDER — TESTOSTERONE CYPIONATE 200 MG/ML IM SOLN: 240.0000 mg | INTRAMUSCULAR | 0 refills | Status: DC

## 2015-10-11 MED ORDER — TADALAFIL 20 MG PO TABS: 10.0000 mg | ORAL_TABLET | ORAL | 11 refills | Status: DC | PRN

## 2015-10-11 MED ORDER — FLUTICASONE PROPIONATE 50 MCG/ACT NA SUSP: NASAL | 0 refills | Status: DC

## 2015-10-11 NOTE — Telephone Encounter (Signed)
[place], Can you send in refills for the requested meds for Mr. Justin Ashley, I'll take care of the medication is mom is asking for. F/U by the end of the month.

## 2015-10-12 ENCOUNTER — Encounter: Payer: Self-pay | Admitting: Family Medicine

## 2015-10-12 DIAGNOSIS — E291 Testicular hypofunction: Secondary | ICD-10-CM

## 2015-10-24 DIAGNOSIS — E291 Testicular hypofunction: Secondary | ICD-10-CM | POA: Diagnosis not present

## 2015-10-24 LAB — CBC
HCT: 49.4 % (ref 38.5–50.0)
Hemoglobin: 17.4 g/dL — ABNORMAL HIGH (ref 13.2–17.1)
MCH: 31.2 pg (ref 27.0–33.0)
MCHC: 35.2 g/dL (ref 32.0–36.0)
MCV: 88.5 fL (ref 80.0–100.0)
MPV: 8.8 fL (ref 7.5–12.5)
PLATELETS: 468 10*3/uL — AB (ref 140–400)
RBC: 5.58 MIL/uL (ref 4.20–5.80)
RDW: 13.3 % (ref 11.0–15.0)
WBC: 4.9 10*3/uL (ref 3.8–10.8)

## 2015-10-24 LAB — COMPLETE METABOLIC PANEL WITH GFR
ALT: 18 U/L (ref 9–46)
AST: 15 U/L (ref 10–40)
Albumin: 4.3 g/dL (ref 3.6–5.1)
Alkaline Phosphatase: 52 U/L (ref 40–115)
BILIRUBIN TOTAL: 0.8 mg/dL (ref 0.2–1.2)
BUN: 13 mg/dL (ref 7–25)
CHLORIDE: 104 mmol/L (ref 98–110)
CO2: 27 mmol/L (ref 20–31)
CREATININE: 0.98 mg/dL (ref 0.60–1.35)
Calcium: 9.5 mg/dL (ref 8.6–10.3)
GFR, Est Non African American: >89 mL/min (ref >=60)
GLUCOSE: 104 mg/dL — AB (ref 65–99)
Potassium: 4.3 mmol/L (ref 3.5–5.3)
Sodium: 138 mmol/L (ref 135–146)
TOTAL PROTEIN: 6.9 g/dL (ref 6.1–8.1)

## 2015-10-24 LAB — PSA: PSA: 1.4 ng/mL (ref <=4.0)

## 2015-10-25 ENCOUNTER — Telehealth: Payer: Self-pay | Admitting: Family Medicine

## 2015-10-25 NOTE — Telephone Encounter (Signed)
Will you please let patient know that his PSA prostate test was normal but his hemoglobin level was elevated from the testosterone supplementation.  We'll need to decrease the dose of this.  Please schedule a follow up appointment to discuss further recommendations.

## 2015-10-25 NOTE — Telephone Encounter (Signed)
Pt notified and transferred to the front desk  

## 2015-10-26 ENCOUNTER — Ambulatory Visit (INDEPENDENT_AMBULATORY_CARE_PROVIDER_SITE_OTHER): Payer: BLUE CROSS/BLUE SHIELD | Admitting: Family Medicine

## 2015-10-26 ENCOUNTER — Encounter: Payer: Self-pay | Admitting: Family Medicine

## 2015-10-26 VITALS — BP 148/89 | HR 92 | Wt 175.0 lb

## 2015-10-26 DIAGNOSIS — I10 Essential (primary) hypertension: Secondary | ICD-10-CM

## 2015-10-26 DIAGNOSIS — E291 Testicular hypofunction: Secondary | ICD-10-CM | POA: Diagnosis not present

## 2015-10-26 DIAGNOSIS — R0602 Shortness of breath: Secondary | ICD-10-CM | POA: Diagnosis not present

## 2015-10-26 MED ORDER — TADALAFIL 20 MG PO TABS: 10.0000 mg | ORAL_TABLET | ORAL | 11 refills | Status: DC | PRN

## 2015-10-26 MED ORDER — TESTOSTERONE CYPIONATE 200 MG/ML IM SOLN: 200.0000 mg | INTRAMUSCULAR | 4 refills | Status: DC

## 2015-10-26 MED ORDER — FLUTICASONE PROPIONATE 50 MCG/ACT NA SUSP: NASAL | 3 refills | Status: DC

## 2015-10-26 NOTE — Progress Notes (Signed)
CC: Justin Ashley is a 40 y.o. male is here for Hypogonadism   Subjective: HPI:  Follow-up hypogonadism: He recently had his PSA and hemoglobin level checked, the latter of the 2 were slightly elevated. He no longer smokes cigarettes. He denies any chest pain or limb claudication. He still believes that testosterone injections are helping his quality of life. He would like refills on Cialis  He's requesting a refill on Flonase. Provided he uses this on a daily basis he denies any shortness of breath. He denies any nasal congestion if he uses Flonase daily  Follow-up essential hypertension: He is taking losartan on a daily basis. Blood pressures at home are consistently below 140/90 he checks his blood pressure almost daily.     Review Of Systems Outlined In HPI  Past Medical History:  Diagnosis Date  . Dislocation of metatarsophalangeal joint of right great toe   . Pneumonia age 40    No past surgical history on file. Family History  Problem Relation Age of Onset  . Cancer Mother     lung  . Cancer Father     stomach, deceased @  1yrs    Social History   Social History  . Marital status: Single    Spouse name: N/A  . Number of children: N/A  . Years of education: N/A   Occupational History  . Not on file.   Social History Main Topics  . Smoking status: Current Every Day Smoker    Years: 22.00    Types: Cigarettes  . Smokeless tobacco: Never Used     Comment: e-cigs smoker x 3 years  . Alcohol use Not on file  . Drug use: Unknown  . Sexual activity: Yes   Other Topics Concern  . Not on file   Social History Narrative  . No narrative on file     Objective: BP (!) 148/89   Pulse 92   Wt 175 lb (79.4 kg)   BMI 26.61 kg/m   General: Alert and Oriented, No Acute Distress HEENT: Pupils equal, round, reactive to light. Conjunctivae clear. Lungs: Clear to auscultation bilaterally, no wheezing/ronchi/rales.  Comfortable work of breathing. Good air  movement. Cardiac: Regular rate and rhythm. Normal S1/S2.  No murmurs, rubs, nor gallops.   Extremities: No peripheral edema.  Strong peripheral pulses.  Mental Status: No depression, anxiety, nor agitation. Skin: Warm and dry.  Assessment & Plan: Justin Ashley was seen today for hypogonadism.  Diagnoses and all orders for this visit:  Hypogonadism in male -     Discontinue: testosterone cypionate (DEPOTESTOSTERONE CYPIONATE) 200 MG/ML injection; Inject 1 mL (200 mg total) into the muscle every 14 (fourteen) days. -     testosterone cypionate (DEPOTESTOSTERONE CYPIONATE) 200 MG/ML injection; Inject 1 mL (200 mg total) into the muscle every 14 (fourteen) days.  Shortness of breath -     fluticasone (FLONASE) 50 MCG/ACT nasal spray; One spray in each nostril twice a day, use left hand for right nostril, and right hand for left nostril.  Essential hypertension  Other orders -     tadalafil (CIALIS) 20 MG tablet; Take 0.5-1 tablets (10-20 mg total) by mouth every other day as needed for erectile dysfunction.   Hypogonadism: Asymptomatic but elevated hemoglobin level therefore decreasing every other week dose to 200 mg Shortness of breath: Controlled with Flonase Essential hypertension: Controlled with losartan   Return in about 3 months (around 01/26/2016).

## 2015-10-27 ENCOUNTER — Telehealth: Payer: Self-pay

## 2015-10-28 NOTE — Telephone Encounter (Signed)
Pt.notified

## 2015-10-28 NOTE — Telephone Encounter (Signed)
The safest thing to do is to still use the lowered dose of testosterone until he starts back up with [facility] then he can go back up on the testosterone if still desired.

## 2015-10-28 NOTE — Telephone Encounter (Signed)
Pt notified.  He stated [facility] will retest his blood on 11/27/15 and if negative they will reinstate him as a patient.

## 2015-10-28 NOTE — Telephone Encounter (Signed)
Thanks for checking, will you please let [name] know about this not being available.

## 2015-11-02 ENCOUNTER — Encounter: Payer: Self-pay | Admitting: Family Medicine

## 2015-11-24 ENCOUNTER — Encounter: Payer: Self-pay | Admitting: Family Medicine

## 2015-11-28 ENCOUNTER — Ambulatory Visit (INDEPENDENT_AMBULATORY_CARE_PROVIDER_SITE_OTHER): Payer: BLUE CROSS/BLUE SHIELD

## 2015-11-28 ENCOUNTER — Encounter: Payer: Self-pay | Admitting: Family Medicine

## 2015-11-28 ENCOUNTER — Ambulatory Visit (INDEPENDENT_AMBULATORY_CARE_PROVIDER_SITE_OTHER): Payer: BLUE CROSS/BLUE SHIELD | Admitting: Family Medicine

## 2015-11-28 VITALS — BP 138/68 | HR 80 | Wt 175.0 lb

## 2015-11-28 DIAGNOSIS — R002 Palpitations: Secondary | ICD-10-CM | POA: Diagnosis not present

## 2015-11-28 DIAGNOSIS — R079 Chest pain, unspecified: Secondary | ICD-10-CM | POA: Diagnosis not present

## 2015-11-28 DIAGNOSIS — Z23 Encounter for immunization: Secondary | ICD-10-CM

## 2015-11-28 IMAGING — DX DG CHEST 2V
2 series · 2 of 2 positions shown · non-contrast
Comparison: None.

CLINICAL DATA: Left-sided chest pain for 2 weeks.  [DATE]

EXAM:
CHEST  2 VIEW

[chest pa]
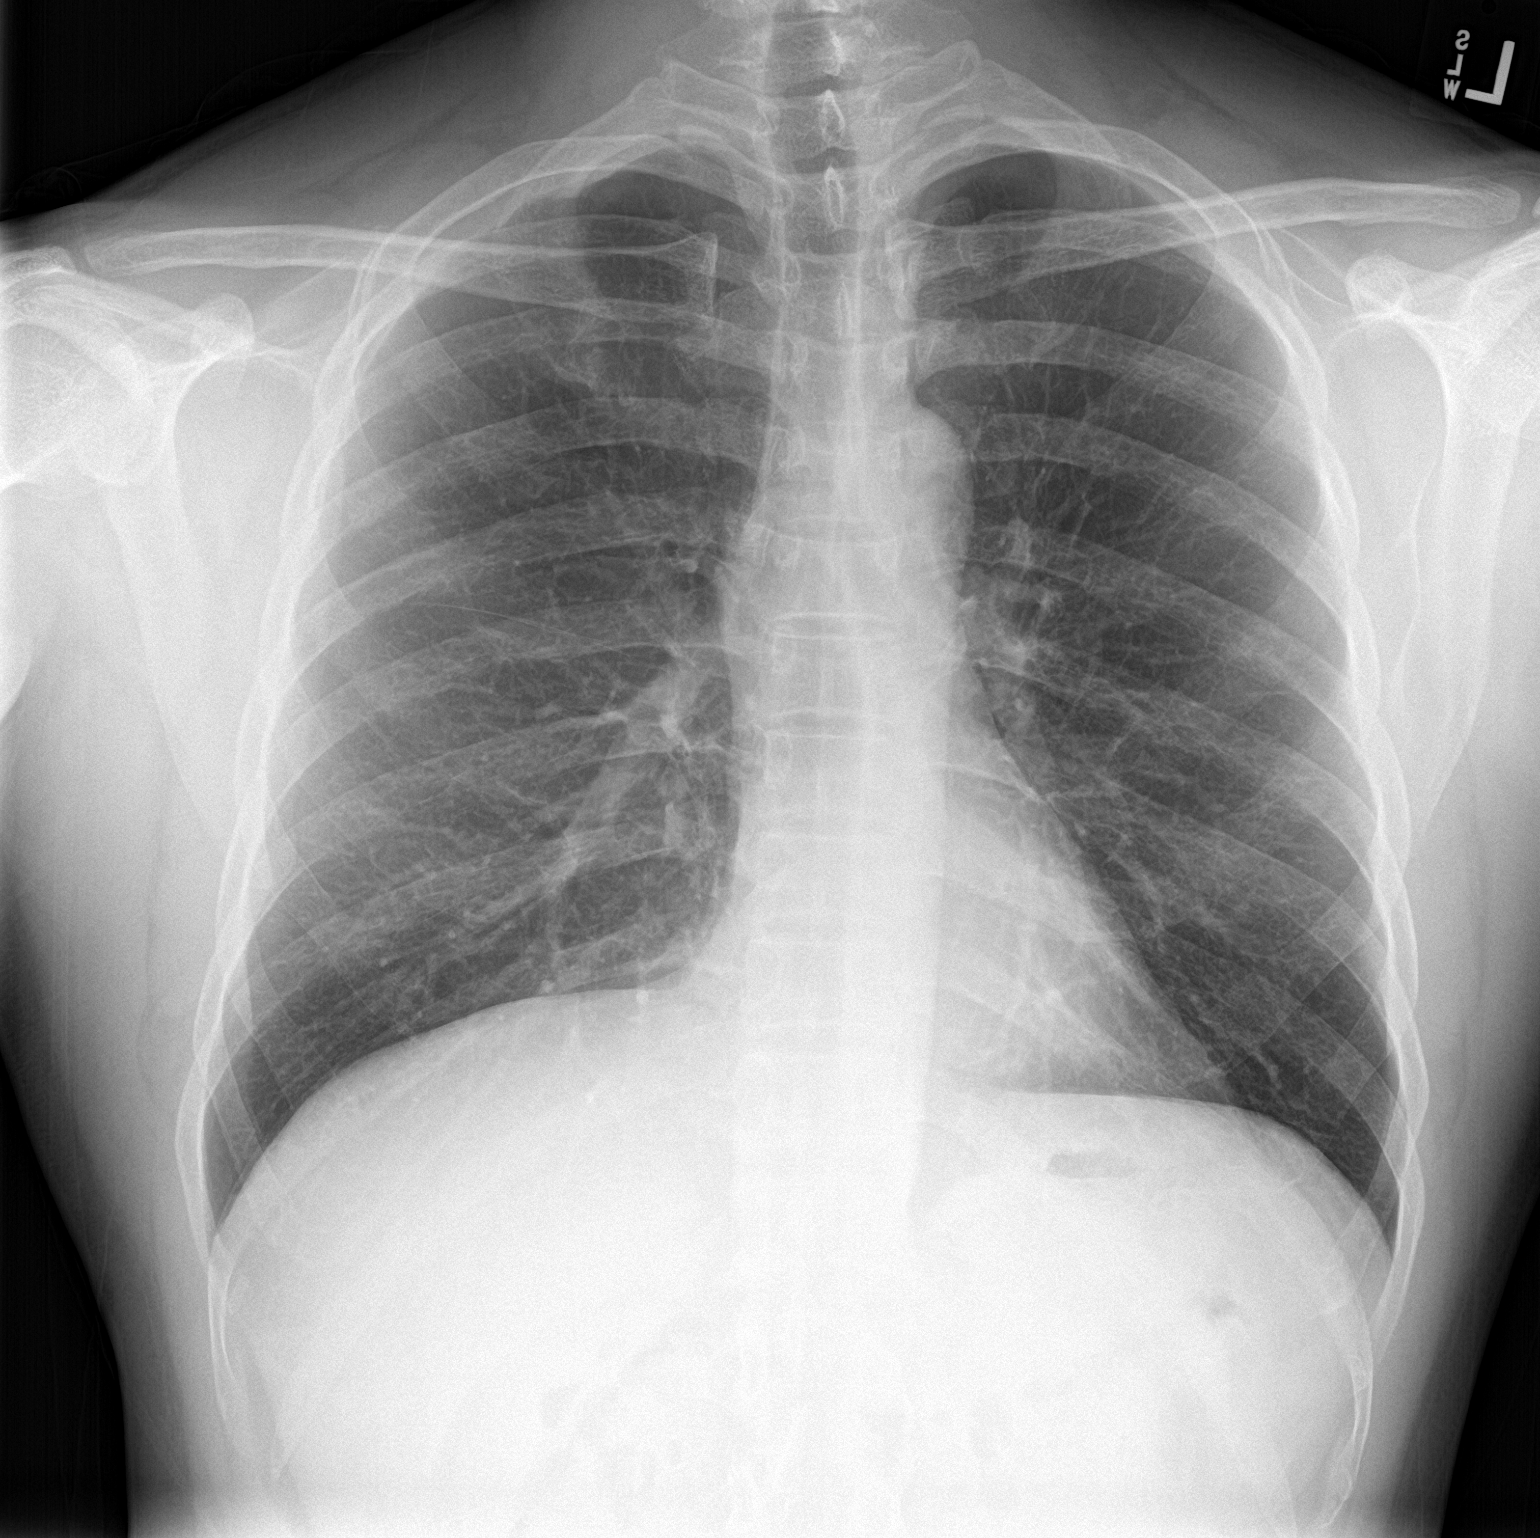

[chest lat]
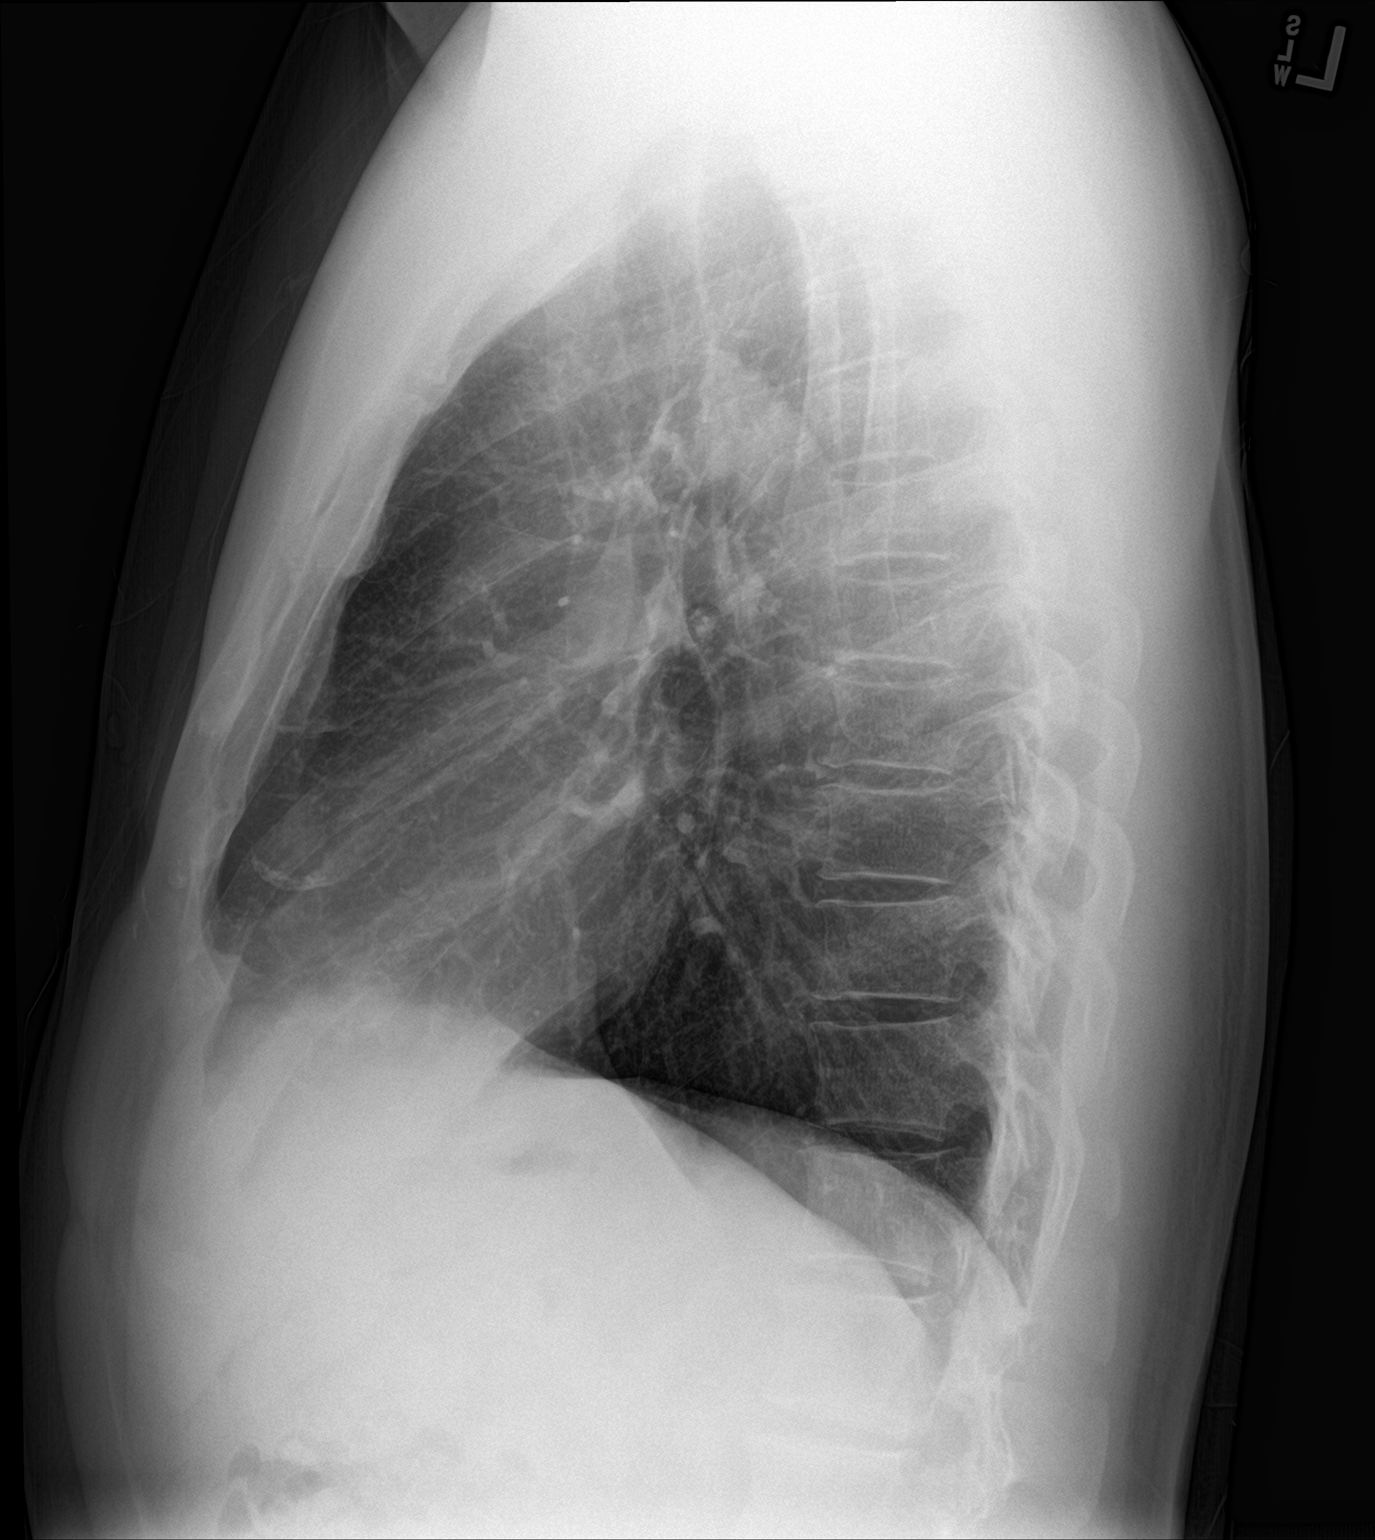

[2 of 2 positions shown; findings below may reference images not displayed]

FINDINGS: Heart and mediastinal contours are within normal limits. No focal
opacities or effusions. No acute bony abnormality.
IMPRESSION: No active cardiopulmonary disease.

## 2015-11-28 NOTE — Progress Notes (Signed)
Justin Ashley is a 40 y.o. male who presents to [facility] today for evaluation of exertional pre-syncope and palpitations.  He first felt these symptoms 3 weeks ago while exercising.  During his workout he felt anxious, light headed, and experienced palpitations with diffuse anterior chest pain. No shortness of breath.  He sat down for 15-20 minutes, went home, and took Hydroxyzine with resolution of his symptoms.  Of note he has a history of anxiety with panic attacks and a 22 pack year smoking history.  He denies recent change in stress level or anxiety.  Patient claims these symptoms are different than his usual panic attacks.    He reports 2 similar episodes associated with exertion since then but claims they weren't as severe.  Over these last 3 weeks, he endorses constant left lateral chest soreness.  He denies known injury or new exercises.  The pain is non-radiating, non-exertional, and not associated with position or movement.  The pain is constant and 7/10 severity at its worst.  He denies shortness of breath except with his typical panic attacks.    Patient also complains of left calf "bubbling".  He doesn't report any pain but claims the bubbling sensation only bothers him at night when he tries to sleep.   No unilateral leg swelling or redness.    Of note, patient recently decreased his dose of Losartan from 50 mg to 25 mg daily because his blood pressure was fluctuating.     Past Medical History:  Diagnosis Date  . Dislocation of metatarsophalangeal joint of right great toe   . Pneumonia age 23   No past surgical history on file. Social History  Substance Use Topics  . Smoking status: Current Every Day Smoker    Years: 22.00    Types: Cigarettes  . Smokeless tobacco: Never Used     Comment: e-cigs smoker x 3 years  . Alcohol use Not on file   family  history includes Cancer in his father and mother.  ROS as above: No headache, visual changes, vomiting, diarrhea, constipation, abdominal pain, skin rash, fevers, chills, night sweats, weight loss, swollen lymph nodes, body aches, joint swelling, muscle aches, mood changes, visual or auditory hallucinations.   Medications: Current Outpatient Prescriptions  Medication Sig Dispense Refill  . AMBULATORY NON FORMULARY MEDICATION BD 67mL syringe and BD 22G 1 and 1/2" needle, use to inject testosterone every two weeks.  BD 18G 1 and 1/2" needle used to draw up testosterone. Dx: Hypogonadism 20 Units 11  . AMBULATORY NON FORMULARY MEDICATION ProAir RespiClick.  Inhale two puffs every four hours only as needed for shortness of breath or wheezing. Use savings voucher. 1 Inhaler 0  . fluticasone (FLONASE) 50 MCG/ACT nasal spray One spray in each nostril twice a day, use left hand for right nostril, and right hand for left nostril. 48 g 3  . hydrOXYzine (ATARAX/VISTARIL) 25 MG tablet Take 1 tablet (25 mg total) by mouth at bedtime. Only for two weeks. 14 tablet 0  . losartan (COZAAR) 50 MG tablet Take 1.5 tablets (75 mg total) by mouth daily. 90 tablet 3  . tadalafil (CIALIS) 20 MG tablet Take 0.5-1 tablets (10-20 mg total) by mouth every other day as needed for erectile dysfunction. 5 tablet 11  . testosterone cypionate (DEPOTESTOSTERONE CYPIONATE) 200 MG/ML injection Inject 1 mL (200 mg total) into the muscle every 14 (fourteen) days. 2 mL 4  . vardenafil (LEVITRA)  10 MG tablet Take 1 tablet (10 mg total) by mouth daily as needed for erectile dysfunction. 10 tablet 11   No current facility-administered medications for this visit.    Allergies  Allergen Reactions  . Lisinopril     Cough  . Zoloft [Sertraline Hcl]     Suicide attempt     Exam:  BP 138/68   Pulse 80   Wt 175 lb (79.4 kg)   SpO2 100%   BMI 26.61 kg/m  Gen: Well NAD, non-toxic appearing HEENT: EOMI,  MMM Lungs: Normal work of  breathing. CTABL, no wheezes or crackles Heart: RRR no MRG, no JVD Abd: NABS, Soft. Nondistended, Nontender Exts: Brisk capillary refill, warm and well perfused.  No lower extremity edema   12 lead EKG interpretation:  Normal sinus rhythm at a rate of 92.  Occasional PVCs.  Normal intervals.  No T wave or ST segment changes   Assessment and Plan: 40 y.o. male with episodes of exertional light headedness, palpitations, and anterior chest pain. His symptoms are concerning for cardiac etiology given the exertional association.  Anxiety could be contributing to his symptoms as well.  Pulmonary etiologies such as a pneumothorax and pulmonary embolism are a possibility but less likely without shortness of breath.   - CBC, CMP, TSH, T3 T4, D dimer, lipid panel, Magnesium - Chest Xray - 48 hour Holter monitor - Echocardiogram - Cardiology referral  Recheck in one month   Orders Placed This Encounter  Procedures  . DG Chest 2 View    Order Specific Question:   Reason for exam:    Answer:   Left chest pain    Order Specific Question:   Preferred imaging location?    Answer:   [facility]  . CBC  . Comprehensive metabolic panel    Order Specific Question:   Has the patient fasted?    Answer:   No  . TSH  . D-dimer, quantitative (not at [facility])  . Lipid panel    Order Specific Question:   Has the patient fasted?    Answer:   No  . Magnesium  . T4, free  . T3, free  . Ambulatory referral to Cardiology    Referral Priority:   Routine    Referral Type:   Consultation    Referral Reason:   Specialty Services Required    Requested Specialty:   Cardiology    Number of Visits Requested:   1  . Holter monitor - 48 hour    Standing Status:   Future    Standing Expiration Date:   11/27/2025    Order Specific Question:   Where should this test be performed?    Answer:   [facility]  . EKG 12-Lead  . ECHOCARDIOGRAM COMPLETE    Standing Status:   Future    Standing Expiration  Date:   02/26/2017    Order Specific Question:   Where should this test be performed    Answer:   [facility]    Order Specific Question:   Complete or Limited study?    Answer:   Complete    Order Specific Question:   Does the patient have a known history of hypersensitivity to Perflutren (aka Scientist, research (medical) for echocardiograms - CHECK ALLERGIES)    Answer:   No    Order Specific Question:   ADMINISTER PERFLUTERN    Answer:   ADMINISTER PERFLUTREN    Order Specific Question:   Expected Date:  Answer:   1 month    Order Specific Question:   Reason for exam-Echo    Answer:   Palpitations  785.1 / R00.2    Discussed warning signs or symptoms. Please see discharge instructions. Patient expresses understanding.

## 2015-11-28 NOTE — Patient Instructions (Addendum)
Thank you for coming in today. Get fasting labs soon.  Get xray today.  You should hear soon about heart studies.   Return in 1 month or sooner if needed.  Avoid heavy exercise until you get worked up.    Palpitations A palpitation is the feeling that your heartbeat is irregular or is faster than normal. It may feel like your heart is fluttering or skipping a beat. Palpitations are usually not a serious problem. However, in some cases, you may need further medical evaluation. CAUSES  Palpitations can be caused by:  Smoking.  Caffeine or other stimulants, such as diet pills or energy drinks.  Alcohol.  Stress and anxiety.  Strenuous physical activity.  Fatigue.  Certain medicines.  Heart disease, especially if you have a history of irregular heart rhythms (arrhythmias), such as atrial fibrillation, atrial flutter, or supraventricular tachycardia.  An improperly working pacemaker or defibrillator. DIAGNOSIS  To find the cause of your palpitations, your health care provider will take your medical history and perform a physical exam. Your health care provider may also have you take a test called an ambulatory electrocardiogram (ECG). An ECG records your heartbeat patterns over a 24-hour period. You may also have other tests, such as:  Transthoracic echocardiogram (TTE). During echocardiography, sound waves are used to evaluate how blood flows through your heart.  Transesophageal echocardiogram (TEE).  Cardiac monitoring. This allows your health care provider to monitor your heart rate and rhythm in real time.  Holter monitor. This is a portable device that records your heartbeat and can help diagnose heart arrhythmias. It allows your health care provider to track your heart activity for several days, if needed.  Stress tests by exercise or by giving medicine that makes the heart beat faster. TREATMENT  Treatment of palpitations depends on the cause of your symptoms and can  vary greatly. Most cases of palpitations do not require any treatment other than time, relaxation, and monitoring your symptoms. Other causes, such as atrial fibrillation, atrial flutter, or supraventricular tachycardia, usually require further treatment. HOME CARE INSTRUCTIONS   Avoid:  Caffeinated coffee, tea, soft drinks, diet pills, and energy drinks.  Chocolate.  Alcohol.  Stop smoking if you smoke.  Reduce your stress and anxiety. Things that can help you relax include:  A method of controlling things in your body, such as your heartbeats, with your mind (biofeedback).  Yoga.  Meditation.  Physical activity such as swimming, jogging, or walking.  Get plenty of rest and sleep. SEEK MEDICAL CARE IF:   You continue to have a fast or irregular heartbeat beyond 24 hours.  Your palpitations occur more often. SEEK IMMEDIATE MEDICAL CARE IF:  You have chest pain or shortness of breath.  You have a severe headache.  You feel dizzy or you faint. MAKE SURE YOU:  Understand these instructions.  Will watch your condition.  Will get help right away if you are not doing well or get worse.   This information is not intended to replace advice given to you by your health care provider. Make sure you discuss any questions you have with your health care provider.   Document Released: 02/24/2000 Document Revised: 03/03/2013 Document Reviewed: 04/27/2011 Elsevier Interactive Patient Education [employer].

## 2015-11-29 ENCOUNTER — Encounter: Payer: Self-pay | Admitting: *Deleted

## 2015-11-29 ENCOUNTER — Ambulatory Visit: Payer: BLUE CROSS/BLUE SHIELD | Admitting: Family Medicine

## 2015-11-29 DIAGNOSIS — R002 Palpitations: Secondary | ICD-10-CM | POA: Diagnosis not present

## 2015-11-29 DIAGNOSIS — R079 Chest pain, unspecified: Secondary | ICD-10-CM | POA: Diagnosis not present

## 2015-11-29 LAB — COMPREHENSIVE METABOLIC PANEL
ALT: 22 U/L (ref 9–46)
AST: 13 U/L (ref 10–40)
Albumin: 4.5 g/dL (ref 3.6–5.1)
Alkaline Phosphatase: 50 U/L (ref 40–115)
BUN: 13 mg/dL (ref 7–25)
CHLORIDE: 106 mmol/L (ref 98–110)
CO2: 27 mmol/L (ref 20–31)
CREATININE: 0.99 mg/dL (ref 0.60–1.35)
Calcium: 9.7 mg/dL (ref 8.6–10.3)
Glucose, Bld: 109 mg/dL — ABNORMAL HIGH (ref 65–99)
Potassium: 4 mmol/L (ref 3.5–5.3)
Sodium: 139 mmol/L (ref 135–146)
TOTAL PROTEIN: 7 g/dL (ref 6.1–8.1)
Total Bilirubin: 1.2 mg/dL (ref 0.2–1.2)

## 2015-11-29 LAB — LIPID PANEL
CHOLESTEROL: 204 mg/dL — AB (ref 125–200)
HDL: 48 mg/dL (ref >=40)
LDL Cholesterol: 126 mg/dL (ref <130)
TRIGLYCERIDES: 148 mg/dL (ref <150)
Total CHOL/HDL Ratio: 4.3 Ratio (ref <=5.0)
VLDL: 30 mg/dL (ref <30)

## 2015-11-29 LAB — T4, FREE: Free T4: 1.4 ng/dL (ref 0.8–1.8)

## 2015-11-29 LAB — CBC
HEMATOCRIT: 47.1 % (ref 38.5–50.0)
HEMOGLOBIN: 16.6 g/dL (ref 13.2–17.1)
MCH: 31.3 pg (ref 27.0–33.0)
MCHC: 35.2 g/dL (ref 32.0–36.0)
MCV: 88.9 fL (ref 80.0–100.0)
MPV: 8.1 fL (ref 7.5–12.5)
Platelets: 411 10*3/uL — ABNORMAL HIGH (ref 140–400)
RBC: 5.3 MIL/uL (ref 4.20–5.80)
RDW: 13.6 % (ref 11.0–15.0)
WBC: 4.7 10*3/uL (ref 3.8–10.8)

## 2015-11-29 LAB — MAGNESIUM: Magnesium: 2.2 mg/dL (ref 1.5–2.5)

## 2015-11-29 LAB — D-DIMER, QUANTITATIVE (NOT AT ARMC)

## 2015-11-29 LAB — T3, FREE: T3 FREE: 3.6 pg/mL (ref 2.3–4.2)

## 2015-11-29 LAB — TSH: TSH: 1.03 m[IU]/L (ref 0.40–4.50)

## 2015-11-30 ENCOUNTER — Ambulatory Visit (INDEPENDENT_AMBULATORY_CARE_PROVIDER_SITE_OTHER): Payer: BLUE CROSS/BLUE SHIELD | Admitting: Cardiology

## 2015-11-30 ENCOUNTER — Telehealth: Payer: Self-pay | Admitting: Cardiology

## 2015-11-30 ENCOUNTER — Encounter: Payer: Self-pay | Admitting: Cardiology

## 2015-11-30 VITALS — BP 125/82 | HR 75 | Ht 68.0 in | Wt 171.0 lb

## 2015-11-30 DIAGNOSIS — R079 Chest pain, unspecified: Secondary | ICD-10-CM | POA: Diagnosis not present

## 2015-11-30 DIAGNOSIS — R002 Palpitations: Secondary | ICD-10-CM | POA: Diagnosis not present

## 2015-11-30 NOTE — Progress Notes (Signed)
Electrophysiology Office Note   Date:  12/01/2015   ID:  Justin Ashley, DOB [birth year], MRN [id number]  PCP:  Lynne Leader, MD   Primary Electrophysiologist:  Larene Ascencio Meredith Leeds, MD    Chief Complaint  Patient presents with  . Palpitations     History of Present Illness: Justin Ashley is a 40 y.o. male who presents today for electrophysiology evaluation.   He has been having chest pain and palpitations for the last few weeks. He says that the chest pain started when he was working out with his son at the gym. He says that he had to sit down and rest during workouts and was close to having them call the ambulance. Ever since then, he has had fairly constant chest pain in the left side of his chest. He does say that it is worse when he exerts himself and better when he rests. It is not necessarily associated with shortness of breath or diaphoresis. He is having palpitations as well. The palpitations are mainly in the mornings. He notices no exacerbating or alleviating factors.    Today, he denies symptoms of shortness of breath, orthopnea, PND, lower extremity edema, claudication, dizziness, presyncope, syncope, bleeding, or neurologic sequela. The patient is tolerating medications without difficulties and is otherwise without complaint today.    Past Medical History:  Diagnosis Date  . Dislocation of metatarsophalangeal joint of right great toe   . Pneumonia age 89   No past surgical history on file. none per patient   Current Outpatient Prescriptions  Medication Sig Dispense Refill  . AMBULATORY NON FORMULARY MEDICATION BD 39mL syringe and BD 22G 1 and 1/2" needle, use to inject testosterone every two weeks.  BD 18G 1 and 1/2" needle used to draw up testosterone. Dx: Hypogonadism 20 Units 11  . AMBULATORY NON FORMULARY MEDICATION ProAir RespiClick.  Inhale two puffs every four hours only as needed for shortness of breath or wheezing. Use savings voucher. 1 Inhaler 0  . fluticasone  (FLONASE) 50 MCG/ACT nasal spray One spray in each nostril twice a day, use left hand for right nostril, and right hand for left nostril. 48 g 3  . hydrOXYzine (ATARAX/VISTARIL) 25 MG tablet Take 1 tablet (25 mg total) by mouth at bedtime. Only for two weeks. 14 tablet 0  . losartan (COZAAR) 50 MG tablet Take 1.5 tablets (75 mg total) by mouth daily. 90 tablet 3  . tadalafil (CIALIS) 20 MG tablet Take 0.5-1 tablets (10-20 mg total) by mouth every other day as needed for erectile dysfunction. 5 tablet 11  . testosterone cypionate (DEPOTESTOSTERONE CYPIONATE) 200 MG/ML injection Inject 1 mL (200 mg total) into the muscle every 14 (fourteen) days. 2 mL 4  . vardenafil (LEVITRA) 10 MG tablet Take 1 tablet (10 mg total) by mouth daily as needed for erectile dysfunction. 10 tablet 11   No current facility-administered medications for this visit.     Allergies:   Lisinopril and Zoloft [sertraline hcl]   Social History:  The patient  reports that he has been smoking Cigarettes.  He has smoked for the past 22.00 years. He has never used smokeless tobacco.   Family History:  The patient's family history includes Cancer in his father and mother.    ROS:  Please see the history of present illness.   Otherwise, review of systems is positive for chest pain, palpitations.   All other systems are reviewed and negative.    PHYSICAL EXAM: VS:  BP 125/82 (BP Location: Left  Arm, Patient Position: Sitting, Cuff Size: Normal)   Pulse 75   Ht 5\' 8"  (1.727 m)   Wt 171 lb 0.6 oz (77.6 kg)   BMI 26.01 kg/m  , BMI Body mass index is 26.01 kg/m. GEN: Well nourished, well developed, in no acute distress  HEENT: normal  Neck: no JVD, carotid bruits, or masses Cardiac: RRR; no murmurs, rubs, or gallops,no edema  Respiratory:  clear to auscultation bilaterally, normal work of breathing GI: soft, nontender, nondistended, + BS MS: no deformity or atrophy  Skin: warm and dry Neuro:  Strength and sensation are  intact Psych: euthymic mood, full affect  EKG:  EKG is ordered today. Personal review of the ekg ordered shows sinus rhythm, rate 79, PVC  Recent Labs: 11/28/2015: ALT 22; BUN 13; Creat 0.99; Hemoglobin 16.6; Magnesium 2.2; Platelets 411; Potassium 4.0; Sodium 139; TSH 1.03    Lipid Panel     Component Value Date/Time   CHOL 204 (H) 11/28/2015 1004   TRIG 148 11/28/2015 1004   HDL 48 11/28/2015 1004   CHOLHDL 4.3 11/28/2015 1004   VLDL 30 11/28/2015 1004   LDLCALC 126 11/28/2015 1004     Wt Readings from Last 3 Encounters:  11/30/15 171 lb 0.6 oz (77.6 kg)  11/28/15 175 lb (79.4 kg)  10/26/15 175 lb (79.4 kg)      Other studies Reviewed: Additional studies/ records that were reviewed today include: PCP notes   ASSESSMENT AND PLAN:  1.  Chest pain: Has both typical and atypical features. His pain is worse when he exerts himself and better when he rests, but it is generally a constant type of pain. His EKG does not show any evidence of ischemia. Further determine if he has cardiac causes of chest pain, we'll get a rest stress Myoview.  2. Palpitations: His EKG today does not show any evidence of significant arrhythmia, though it does show that he is having occasional PVCs. Palpitations occur daily, and he is artery set up for a 48 hour monitor. We'll follow up for the results of the monitor and make a treatment plan based on that.    Current medicines are reviewed at length with the patient today.   The patient does not have concerns regarding his medicines.  The following changes were made today:  none  Labs/ tests ordered today include:  Orders Placed This Encounter  Procedures  . Myocardial Perfusion Imaging  . EKG 12-Lead     Disposition:   FU with Jcion Buddenhagen 6 weeks  Signed, Chenille Toor Meredith Leeds, MD  12/01/2015 12:20 PM     [address] [place] [id number] [phone] (office) [phone] (fax)

## 2015-11-30 NOTE — Patient Instructions (Signed)
Medication Instructions:    Your physician recommends that you continue on your current medications as directed. Please refer to the Current Medication list given to you today.  --- If you need a refill on your cardiac medications before your next appointment, please call your pharmacy. ---  Labwork:  None ordered  Testing/Procedures: Your physician has requested that you have a lexiscan myoview. For further information please visit [email or web address]. Please follow instruction sheet, as given.   Follow-Up:  Your physician recommends that you schedule a follow-up appointment in: 6 weeks with Dr. Curt Bears.  Thank you for choosing [facility]!!   Trinidad Curet, RN [phone]     Any Other Special Instructions Will Be Listed Below (If Applicable). Pharmacologic Stress Electrocardiogram A pharmacologic stress electrocardiogram is a heart (cardiac) test that uses nuclear imaging to evaluate the blood supply to your heart. This test may also be called a pharmacologic stress electrocardiography. Pharmacologic means that a medicine is used to increase your heart rate and blood pressure.  This stress test is done to find areas of poor blood flow to the heart by determining the extent of coronary artery disease (CAD). Some people exercise on a treadmill, which naturally increases the blood flow to the heart. For those people unable to exercise on a treadmill, a medicine is used. This medicine stimulates your heart and will cause your heart to beat harder and more quickly, as if you were exercising.  Pharmacologic stress tests can help determine:  The adequacy of blood flow to your heart during increased levels of activity in order to clear you for discharge home.  The extent of coronary artery blockage caused by CAD.  Your prognosis if you have suffered a heart attack.  The effectiveness of cardiac procedures done, such as an angioplasty, which can increase the circulation in your  coronary arteries.  Causes of chest pain or pressure. LET [facility] CARE PROVIDER KNOW ABOUT:  Any allergies you have.  All medicines you are taking, including vitamins, herbs, eye drops, creams, and over-the-counter medicines.  Previous problems you or members of your family have had with the use of anesthetics.  Any blood disorders you have.  Previous surgeries you have had.  Medical conditions you have.  Possibility of pregnancy, if this applies.  If you are currently breastfeeding. RISKS AND COMPLICATIONS Generally, this is a safe procedure. However, as with any procedure, complications can occur. Possible complications include:  You develop pain or pressure in the following areas:  Chest.  Jaw or neck.  Between your shoulder blades.  Radiating down your left arm.  Headache.  Dizziness or light-headedness.  Shortness of breath.  Increased or irregular heartbeat.  Low blood pressure.  Nausea or vomiting.  Flushing.  Redness going up the arm and slight pain during injection of medicine.  Heart attack (rare). BEFORE THE PROCEDURE   Avoid all forms of caffeine for 24 hours before your test or as directed by your health care provider. This includes coffee, tea (even decaffeinated tea), caffeinated sodas, chocolate, cocoa, and certain pain medicines.  Follow your health care provider's instructions regarding eating and drinking before the test.  Take your medicines as directed at regular times with water unless instructed otherwise. Exceptions may include:  If you have diabetes, ask how you are to take your insulin or pills. It is common to adjust insulin dosing the morning of the test.  If you are taking beta-blocker medicines, it is important to talk to your health care provider  about these medicines well before the date of your test. Taking beta-blocker medicines may interfere with the test. In some cases, these medicines need to be changed or stopped 24  hours or more before the test.  If you wear a nitroglycerin patch, it may need to be removed prior to the test. Ask your health care provider if the patch should be removed before the test.  If you use an inhaler for any breathing condition, bring it with you to the test.  If you are an outpatient, bring a snack so you can eat right after the stress phase of the test.  Do not smoke for 4 hours prior to the test or as directed by your health care provider.  Do not apply lotions, powders, creams, or oils on your chest prior to the test.  Wear comfortable shoes and clothing. Let your health care provider know if you were unable to complete or follow the preparations for your test. PROCEDURE   Multiple patches (electrodes) will be put on your chest. If needed, small areas of your chest may be shaved to get better contact with the electrodes. Once the electrodes are attached to your body, multiple wires will be attached to the electrodes, and your heart rate will be monitored.  An IV access will be started. A nuclear trace (isotope) is given. The isotope may be given intravenously, or it may be swallowed. Nuclear refers to several types of radioactive isotopes, and the nuclear isotope lights up the arteries so that the nuclear images are clear. The isotope is absorbed by your body. This results in low radiation exposure.  A resting nuclear image is taken to show how your heart functions at rest.  A medicine is given through the IV access.  A second scan is done about 1 hour after the medicine injection and determines how your heart functions under stress.  During this stress phase, you will be connected to an electrocardiogram machine. Your blood pressure and oxygen levels will be monitored. AFTER THE PROCEDURE   Your heart rate and blood pressure will be monitored after the test.  You may return to your normal schedule, including diet,activities, and medicines, unless your health care  provider tells you otherwise.   This information is not intended to replace advice given to you by your health care provider. Make sure you discuss any questions you have with your health care provider.   Document Released: 07/15/2008 Document Revised: 03/03/2013 Document Reviewed: 11/03/2012 Elsevier Interactive Patient Education [employer].

## 2015-12-05 ENCOUNTER — Ambulatory Visit (INDEPENDENT_AMBULATORY_CARE_PROVIDER_SITE_OTHER): Payer: BLUE CROSS/BLUE SHIELD

## 2015-12-05 DIAGNOSIS — R002 Palpitations: Secondary | ICD-10-CM | POA: Diagnosis not present

## 2015-12-05 DIAGNOSIS — R079 Chest pain, unspecified: Secondary | ICD-10-CM

## 2015-12-07 ENCOUNTER — Ambulatory Visit (HOSPITAL_BASED_OUTPATIENT_CLINIC_OR_DEPARTMENT_OTHER)
Admission: RE | Admit: 2015-12-07 | Discharge: 2015-12-07 | Disposition: A | Discharge status: Home / Self Care | Payer: BLUE CROSS/BLUE SHIELD | Source: Ambulatory Visit | Attending: Family Medicine | Admitting: Family Medicine

## 2015-12-07 ENCOUNTER — Telehealth (HOSPITAL_COMMUNITY): Payer: Self-pay | Admitting: *Deleted

## 2015-12-07 DIAGNOSIS — R002 Palpitations: Secondary | ICD-10-CM | POA: Diagnosis not present

## 2015-12-07 DIAGNOSIS — I517 Cardiomegaly: Secondary | ICD-10-CM | POA: Insufficient documentation

## 2015-12-07 DIAGNOSIS — R079 Chest pain, unspecified: Secondary | ICD-10-CM | POA: Diagnosis not present

## 2015-12-07 NOTE — Telephone Encounter (Signed)
Left message on voicemail in reference to upcoming appointment scheduled for 12/08/15. Phone number given for a call back so details instructions can be given. Justin Ashley

## 2015-12-07 NOTE — Progress Notes (Signed)
  Echocardiogram 2D Echocardiogram has been performed.  Donata Clay 12/07/2015, 1:54 PM

## 2015-12-08 ENCOUNTER — Ambulatory Visit (HOSPITAL_COMMUNITY): Payer: BLUE CROSS/BLUE SHIELD | Attending: Cardiology

## 2015-12-08 DIAGNOSIS — R079 Chest pain, unspecified: Secondary | ICD-10-CM | POA: Diagnosis not present

## 2015-12-08 LAB — MYOCARDIAL PERFUSION IMAGING
CHL CUP NUCLEAR SDS: 3
CHL CUP NUCLEAR SSS: 3
LHR: 0.28
LVDIAVOL: 112 mL (ref 62–150)
LVSYSVOL: 50 mL
NUC STRESS TID: 1.02
Peak HR: 118 {beats}/min
Rest HR: 69 {beats}/min
SRS: 3

## 2015-12-08 IMAGING — NM NM MISC PROCEDURE
9 series · 54 of 54 positions shown · non-contrast
Comparison: none

[Series 1: rest · 6.51mm/px · 6 of 64 frames shown]
[frame 6/64]
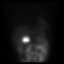
[frame 16/64]
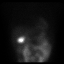
[frame 27/64]
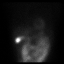
[frame 38/64]
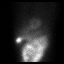
[frame 48/64]
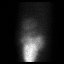
[frame 59/64]
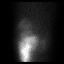

[Series 1: wbr_s-proj_st stress_(id)_sa · 6.5mm · 6.51mm/px · 6 of 512 frames shown (1 of 2)]
[frame 43/512]
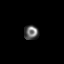
[frame 128/512]
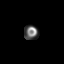
[frame 214/512]
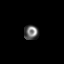
[frame 299/512]
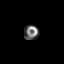
[frame 384/512]
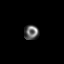
[frame 470/512]
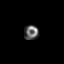

[Series 1: wbr_r-proj_st rest_(id)_sa · 6.5mm · 6.51mm/px · 6 of 64 frames shown]
[frame 6/64]
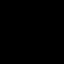
[frame 16/64]
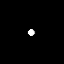
[frame 27/64]
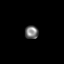
[frame 38/64]
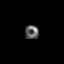
[frame 48/64]
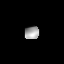
[frame 59/64]
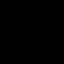

[Series 1: wbr_s-proj_st stress_(id)_sa · 6.5mm · 6.51mm/px · 6 of 64 frames shown (2 of 2)]
[frame 6/64]
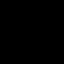
[frame 16/64]
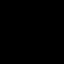
[frame 27/64]
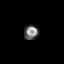
[frame 38/64]
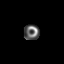
[frame 48/64]
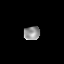
[frame 59/64]
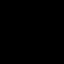

[Series 1: wbr_r-proj_st rest · 6.51mm/px · 6 of 64 frames shown]
[frame 6/64]
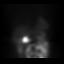
[frame 16/64]
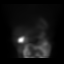
[frame 27/64]
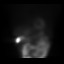
[frame 38/64]
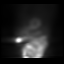
[frame 48/64]
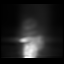
[frame 59/64]
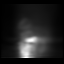

[Series 2: wbr_s-proj_st stress · 6.51mm/px · 6 of 512 frames shown (1 of 2)]
[frame 43/512]
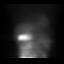
[frame 128/512]
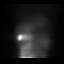
[frame 214/512]
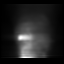
[frame 299/512]
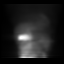
[frame 384/512]
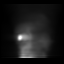
[frame 470/512]
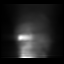

[Series 2: stress · 6.51mm/px · 6 of 64 frames shown (1 of 2)]
[frame 6/64]
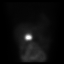
[frame 16/64]
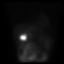
[frame 27/64]
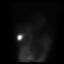
[frame 38/64]
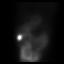
[frame 48/64]
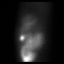
[frame 59/64]
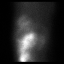

[Series 2: stress · 6.51mm/px · 6 of 512 frames shown (2 of 2)]
[frame 43/512]
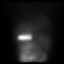
[frame 128/512]
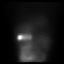
[frame 214/512]
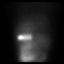
[frame 299/512]
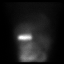
[frame 384/512]
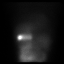
[frame 470/512]
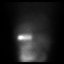

[Series 2: wbr_s-proj_st stress · 6.51mm/px · 6 of 64 frames shown (2 of 2)]
[frame 6/64]
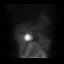
[frame 16/64]
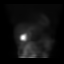
[frame 27/64]
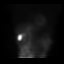
[frame 38/64]
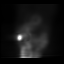
[frame 48/64]
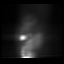
[frame 59/64]
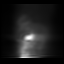

[54 of 54 positions shown; findings below may reference images not displayed]

Canned report from images found in remote index.

Refer to host system for actual result text.

## 2015-12-08 MED ORDER — TECHNETIUM TC 99M TETROFOSMIN IV KIT
32.7000 | PACK | Freq: Once | INTRAVENOUS | Status: AC | PRN
  Administered 2015-12-08: 32.7 via INTRAVENOUS
  Filled 2015-12-08: qty 33

## 2015-12-08 MED ORDER — REGADENOSON 0.4 MG/5ML IV SOLN
0.4000 mg | Freq: Once | INTRAVENOUS | Status: AC
  Administered 2015-12-08: 0.4 mg via INTRAVENOUS

## 2015-12-08 MED ORDER — TECHNETIUM TC 99M TETROFOSMIN IV KIT
11.0000 | PACK | Freq: Once | INTRAVENOUS | Status: AC | PRN
  Administered 2015-12-08: 11 via INTRAVENOUS
  Filled 2015-12-08: qty 11

## 2015-12-13 ENCOUNTER — Encounter: Payer: Self-pay | Admitting: Family Medicine

## 2015-12-25 ENCOUNTER — Encounter: Payer: Self-pay | Admitting: Family Medicine

## 2015-12-25 DIAGNOSIS — E291 Testicular hypofunction: Secondary | ICD-10-CM

## 2015-12-26 MED ORDER — TESTOSTERONE CYPIONATE 200 MG/ML IM SOLN: 240.0000 mg | INTRAMUSCULAR | 4 refills | Status: DC

## 2015-12-26 NOTE — Addendum Note (Signed)
Addended by: Gregor Hams on: 12/26/2015 04:14 PM   Modules accepted: Orders

## 2016-01-04 ENCOUNTER — Encounter: Payer: Self-pay | Admitting: Family Medicine

## 2016-01-04 DIAGNOSIS — E291 Testicular hypofunction: Secondary | ICD-10-CM

## 2016-01-06 MED ORDER — TESTOSTERONE CYPIONATE 200 MG/ML IM SOLN: 240.0000 mg | INTRAMUSCULAR | 2 refills | Status: DC

## 2016-01-26 ENCOUNTER — Ambulatory Visit: Payer: BLUE CROSS/BLUE SHIELD | Admitting: Family Medicine

## 2016-02-01 ENCOUNTER — Ambulatory Visit: Payer: BLUE CROSS/BLUE SHIELD | Admitting: Cardiology

## 2016-03-12 HISTORY — PX: DENTAL SURGERY: SHX609

## 2016-04-16 ENCOUNTER — Encounter: Payer: Self-pay | Admitting: Family Medicine

## 2016-04-16 DIAGNOSIS — R7989 Other specified abnormal findings of blood chemistry: Secondary | ICD-10-CM

## 2016-04-16 DIAGNOSIS — Z5181 Encounter for therapeutic drug level monitoring: Secondary | ICD-10-CM

## 2016-04-16 DIAGNOSIS — E785 Hyperlipidemia, unspecified: Secondary | ICD-10-CM

## 2016-04-17 ENCOUNTER — Telehealth: Payer: Self-pay | Admitting: Family Medicine

## 2016-04-17 NOTE — Telephone Encounter (Signed)
Continue home sick management. If worse return to clinic.

## 2016-04-17 NOTE — Telephone Encounter (Signed)
Pt advised of recommendation, verbalized understanding. Will contact clinic if symptoms persist.

## 2016-04-17 NOTE — Telephone Encounter (Signed)
Pt states he donated blood on Sunday, then yesterday he started feeling sick. Currently he is running a fever of 100. No direct contact with any person with the flu. Complaining of body aches, diarrhea, nausea. Denies any vomiting. Will route for review.

## 2016-04-30 ENCOUNTER — Ambulatory Visit: Payer: BLUE CROSS/BLUE SHIELD | Admitting: Family Medicine

## 2016-04-30 DIAGNOSIS — E349 Endocrine disorder, unspecified: Secondary | ICD-10-CM | POA: Diagnosis not present

## 2016-04-30 DIAGNOSIS — Z5181 Encounter for therapeutic drug level monitoring: Secondary | ICD-10-CM | POA: Diagnosis not present

## 2016-04-30 DIAGNOSIS — E785 Hyperlipidemia, unspecified: Secondary | ICD-10-CM | POA: Diagnosis not present

## 2016-04-30 LAB — CBC
HCT: 48.4 % (ref 38.5–50.0)
Hemoglobin: 16.2 g/dL (ref 13.2–17.1)
MCH: 30.3 pg (ref 27.0–33.0)
MCHC: 33.5 g/dL (ref 32.0–36.0)
MCV: 90.5 fL (ref 80.0–100.0)
MPV: 9 fL (ref 7.5–12.5)
PLATELETS: 502 10*3/uL — AB (ref 140–400)
RBC: 5.35 MIL/uL (ref 4.20–5.80)
RDW: 12.9 % (ref 11.0–15.0)
WBC: 4.8 10*3/uL (ref 3.8–10.8)

## 2016-05-01 LAB — COMPLETE METABOLIC PANEL WITH GFR
ALBUMIN: 4.5 g/dL (ref 3.6–5.1)
ALK PHOS: 58 U/L (ref 40–115)
ALT: 14 U/L (ref 9–46)
AST: 13 U/L (ref 10–40)
BUN: 11 mg/dL (ref 7–25)
CALCIUM: 9.3 mg/dL (ref 8.6–10.3)
CO2: 26 mmol/L (ref 20–31)
Chloride: 106 mmol/L (ref 98–110)
Creat: 1.02 mg/dL (ref 0.60–1.35)
GFR, Est African American: >89 mL/min (ref >=60)
GLUCOSE: 99 mg/dL (ref 65–99)
POTASSIUM: 4 mmol/L (ref 3.5–5.3)
SODIUM: 141 mmol/L (ref 135–146)
Total Bilirubin: 0.5 mg/dL (ref 0.2–1.2)
Total Protein: 7.2 g/dL (ref 6.1–8.1)

## 2016-05-01 LAB — PSA: PSA: 1.5 ng/mL (ref <=4.0)

## 2016-05-01 LAB — LIPID PANEL
CHOL/HDL RATIO: 5.4 ratio — AB (ref <5.0)
CHOLESTEROL: 209 mg/dL — AB (ref <200)
HDL: 39 mg/dL — AB (ref >40)
LDL Cholesterol: 149 mg/dL — ABNORMAL HIGH (ref <100)
Triglycerides: 106 mg/dL (ref <150)
VLDL: 21 mg/dL (ref <30)

## 2016-05-01 LAB — TESTOSTERONE: Testosterone: 460 ng/dL (ref 250–827)

## 2016-05-02 ENCOUNTER — Encounter: Payer: Self-pay | Admitting: Family Medicine

## 2016-05-02 ENCOUNTER — Ambulatory Visit (INDEPENDENT_AMBULATORY_CARE_PROVIDER_SITE_OTHER): Payer: BLUE CROSS/BLUE SHIELD | Admitting: Family Medicine

## 2016-05-02 VITALS — BP 117/77 | HR 84 | Temp 98.4°F | Wt 174.0 lb

## 2016-05-02 DIAGNOSIS — E291 Testicular hypofunction: Secondary | ICD-10-CM

## 2016-05-02 DIAGNOSIS — Z23 Encounter for immunization: Secondary | ICD-10-CM

## 2016-05-02 DIAGNOSIS — R109 Unspecified abdominal pain: Secondary | ICD-10-CM

## 2016-05-02 DIAGNOSIS — F410 Panic disorder [episodic paroxysmal anxiety] without agoraphobia: Secondary | ICD-10-CM | POA: Diagnosis not present

## 2016-05-02 DIAGNOSIS — R1013 Epigastric pain: Secondary | ICD-10-CM | POA: Diagnosis not present

## 2016-05-02 DIAGNOSIS — A048 Other specified bacterial intestinal infections: Secondary | ICD-10-CM | POA: Diagnosis not present

## 2016-05-02 MED ORDER — BUSPIRONE HCL 5 MG PO TABS: 5.0000 mg | ORAL_TABLET | Freq: Three times a day (TID) | ORAL | 0 refills | Status: DC | PRN

## 2016-05-02 MED ORDER — TESTOSTERONE CYPIONATE 200 MG/ML IM SOLN: 240.0000 mg | INTRAMUSCULAR | 5 refills | Status: DC

## 2016-05-02 NOTE — Addendum Note (Signed)
Addended by: Darla Lesches T on: 05/02/2016 05:09 PM   Modules accepted: Orders

## 2016-05-02 NOTE — Progress Notes (Signed)
Justin Ashley is a 41 y.o. male who presents to [facility]: [place] today for discuss abdominal discomfort and panic disorder.  Patient notes a several month history of mild dyspepsia and abdominal discomfort after eating. He notes a strong family history of H. pylori and that his father had stomach cancer. He is interested in H. pylori testing. He does not take any medications for his abdominal discomfort. He denies vomiting or diarrhea or severe abdominal pain. He is able to eat and drink normally and denies any constipation.  Panic disorder: Patient has a history of panic disorder that now is much better controlled with lifestyle modification. He notes hydroxyzine when he takes it causes significant fatigue. He notes it's been months since he had to take the hydroxyzine but would like a different medication if possible but does not last very long.  Testosterone: Patient currently takes depo testosterone for hypogonadism. He notes this significantly helps his libido and fatigue. He feels well.   Past Medical History:  Diagnosis Date  . Dislocation of metatarsophalangeal joint of right great toe   . Pneumonia age 35   No past surgical history on file. Social History  Substance Use Topics  . Smoking status: Current Every Day Smoker    Years: 22.00    Types: Cigarettes  . Smokeless tobacco: Never Used     Comment: e-cigs smoker x 3 years  . Alcohol use Not on file   family history includes Cancer in his father and mother.  ROS as above:  Medications: Current Outpatient Prescriptions  Medication Sig Dispense Refill  . AMBULATORY NON FORMULARY MEDICATION BD 39mL syringe and BD 22G 1 and 1/2" needle, use to inject testosterone every two weeks.  BD 18G 1 and 1/2" needle used to draw up testosterone. Dx: Hypogonadism 20 Units 11  . AMBULATORY NON FORMULARY MEDICATION ProAir  RespiClick.  Inhale two puffs every four hours only as needed for shortness of breath or wheezing. Use savings voucher. 1 Inhaler 0  . fluticasone (FLONASE) 50 MCG/ACT nasal spray One spray in each nostril twice a day, use left hand for right nostril, and right hand for left nostril. 48 g 3  . losartan (COZAAR) 50 MG tablet Take 1.5 tablets (75 mg total) by mouth daily. 90 tablet 3  . tadalafil (CIALIS) 20 MG tablet Take 0.5-1 tablets (10-20 mg total) by mouth every other day as needed for erectile dysfunction. 5 tablet 11  . busPIRone (BUSPAR) 5 MG tablet Take 1 tablet (5 mg total) by mouth 3 (three) times daily as needed (panic). 30 tablet 0  . testosterone cypionate (DEPOTESTOSTERONE CYPIONATE) 200 MG/ML injection Inject 1.2 mLs (240 mg total) into the muscle every 14 (fourteen) days. 12 mL 5   No current facility-administered medications for this visit.    Allergies  Allergen Reactions  . Lisinopril     Cough  . Zoloft [Sertraline Hcl]     Suicide attempt    Health Maintenance Health Maintenance  Topic Date Due  . Samul Dada  08/03/1992  . INFLUENZA VACCINE  Completed  . HIV Screening  Completed     Exam:  BP 117/77   Pulse 84   Temp 98.4 F (36.9 C) (Oral)   Wt 174 lb (78.9 kg)   SpO2 99%   BMI 26.46 kg/m  Gen: Well NAD HEENT: EOMI,  MMM Lungs: Normal work of breathing. CTABL Heart: RRR no MRG Abd: NABS, Soft. Nondistended, Nontender Exts:  Brisk capillary refill, warm and well perfused.    Results for orders placed or performed in visit on 04/16/16 (from the past 72 hour(s))  CBC     Status: Abnormal   Collection Time: 04/30/16  9:02 AM  Result Value Ref Range   WBC 4.8 3.8 - 10.8 K/uL   RBC 5.35 4.20 - 5.80 MIL/uL   Hemoglobin 16.2 13.2 - 17.1 g/dL   HCT 48.4 38.5 - 50.0 %   MCV 90.5 80.0 - 100.0 fL   MCH 30.3 27.0 - 33.0 pg   MCHC 33.5 32.0 - 36.0 g/dL   RDW 12.9 11.0 - 15.0 %   Platelets 502 (H) 140 - 400 K/uL   MPV 9.0 7.5 - 12.5 fL  PSA     Status:  None   Collection Time: 04/30/16  9:02 AM  Result Value Ref Range   PSA 1.5 <=4.0 ng/mL    Comment:   The total PSA value from this assay system is standardized against the WHO standard. The test result will be approximately 20% lower when compared to the equimolar-standardized total PSA ([employer]). Comparison of serial PSA results should be interpreted with this fact in mind.   This test was performed using the Siemens chemiluminescent method. Values obtained from different assay methods cannot be used interchangeably. PSA levels, regardless of value, should not be interpreted as absolute evidence of the presence or absence of disease.     Testosterone     Status: None   Collection Time: 04/30/16  9:02 AM  Result Value Ref Range   Testosterone 460 250 - 827 ng/dL  COMPLETE METABOLIC PANEL WITH GFR     Status: None   Collection Time: 04/30/16  9:02 AM  Result Value Ref Range   Sodium 141 135 - 146 mmol/L   Potassium 4.0 3.5 - 5.3 mmol/L   Chloride 106 98 - 110 mmol/L   CO2 26 20 - 31 mmol/L   Glucose, Bld 99 65 - 99 mg/dL   BUN 11 7 - 25 mg/dL   Creat 1.02 0.60 - 1.35 mg/dL   Total Bilirubin 0.5 0.2 - 1.2 mg/dL   Alkaline Phosphatase 58 40 - 115 U/L   AST 13 10 - 40 U/L   ALT 14 9 - 46 U/L   Total Protein 7.2 6.1 - 8.1 g/dL   Albumin 4.5 3.6 - 5.1 g/dL   Calcium 9.3 8.6 - 10.3 mg/dL   GFR, Est African American >89 >=60 mL/min   GFR, Est Non African American >89 >=60 mL/min  Lipid panel     Status: Abnormal   Collection Time: 04/30/16  9:02 AM  Result Value Ref Range   Cholesterol 209 (H) <200 mg/dL   Triglycerides 106 <150 mg/dL   HDL 39 (L) >40 mg/dL   Total CHOL/HDL Ratio 5.4 (H) <5.0 Ratio   VLDL 21 <30 mg/dL   LDL Cholesterol 149 (H) <100 mg/dL   No results found.    Assessment and Plan: 41 y.o. male with  Dyspepsia: H. pylori testing pending. If negative will try empiric treatment with omeprazole for a month if positive will treat.  Doing well.  Try buspirone as needed. I don't anticipate patient will have to use medication very often.  Let testosterone: Doing well. Continue current regimen.   Orders Placed This Encounter  Procedures  . H. pylori breath test   Meds ordered this encounter  Medications  . testosterone cypionate (DEPOTESTOSTERONE CYPIONATE) 200 MG/ML injection    Sig:  Inject 1.2 mLs (240 mg total) into the muscle every 14 (fourteen) days.    Dispense:  12 mL    Refill:  5  . busPIRone (BUSPAR) 5 MG tablet    Sig: Take 1 tablet (5 mg total) by mouth 3 (three) times daily as needed (panic).    Dispense:  30 tablet    Refill:  0     Discussed warning signs or symptoms. Please see discharge instructions. Patient expresses understanding.

## 2016-05-02 NOTE — Patient Instructions (Signed)
Thank you for coming in today. We will contact you with the results of the breath test soon.  I recommend trying a lose a few pounds, continue activity and consider adding OTC krill oil daily.   Recheck as needed or in 6 months.

## 2016-05-03 ENCOUNTER — Encounter: Payer: Self-pay | Admitting: Family Medicine

## 2016-05-03 DIAGNOSIS — A048 Other specified bacterial intestinal infections: Secondary | ICD-10-CM | POA: Insufficient documentation

## 2016-05-03 LAB — H. PYLORI BREATH TEST: H. PYLORI BREATH TEST: DETECTED — AB

## 2016-05-03 MED ORDER — CLARITHROMYCIN 500 MG PO TABS: 500.0000 mg | ORAL_TABLET | Freq: Two times a day (BID) | ORAL | 0 refills | Status: AC

## 2016-05-03 MED ORDER — AMOXICILLIN 500 MG PO CAPS: 1000.0000 mg | ORAL_CAPSULE | Freq: Two times a day (BID) | ORAL | 0 refills | Status: AC

## 2016-05-03 MED ORDER — OMEPRAZOLE 40 MG PO CPDR: 40.0000 mg | DELAYED_RELEASE_CAPSULE | Freq: Two times a day (BID) | ORAL | 0 refills | Status: DC

## 2016-05-03 NOTE — Addendum Note (Signed)
Addended by: Gregor Hams on: 05/03/2016 12:46 PM   Modules accepted: Orders

## 2016-05-06 ENCOUNTER — Encounter: Payer: Self-pay | Admitting: Family Medicine

## 2016-05-06 DIAGNOSIS — R1084 Generalized abdominal pain: Secondary | ICD-10-CM

## 2016-05-07 NOTE — Telephone Encounter (Signed)
Lab slip placed up front for pt pick up. Called pt and informed him that he needs to pick up collection kit from lab downstairs after picking up lab slip. Pt verbalized understanding.

## 2016-05-08 DIAGNOSIS — R1084 Generalized abdominal pain: Secondary | ICD-10-CM | POA: Diagnosis not present

## 2016-05-10 LAB — OVA AND PARASITE EXAMINATION: OP: NONE SEEN

## 2016-05-18 ENCOUNTER — Encounter (HOSPITAL_BASED_OUTPATIENT_CLINIC_OR_DEPARTMENT_OTHER): Payer: Self-pay | Admitting: Emergency Medicine

## 2016-05-18 ENCOUNTER — Emergency Department (HOSPITAL_BASED_OUTPATIENT_CLINIC_OR_DEPARTMENT_OTHER): Payer: BLUE CROSS/BLUE SHIELD

## 2016-05-18 ENCOUNTER — Encounter: Payer: Self-pay | Admitting: Family Medicine

## 2016-05-18 ENCOUNTER — Emergency Department (HOSPITAL_BASED_OUTPATIENT_CLINIC_OR_DEPARTMENT_OTHER)
Admission: EM | Admit: 2016-05-18 | Discharge: 2016-05-18 | Disposition: A | Discharge status: Home / Self Care | Payer: BLUE CROSS/BLUE SHIELD | Attending: Emergency Medicine | Admitting: Emergency Medicine

## 2016-05-18 DIAGNOSIS — F419 Anxiety disorder, unspecified: Secondary | ICD-10-CM | POA: Diagnosis not present

## 2016-05-18 DIAGNOSIS — F17210 Nicotine dependence, cigarettes, uncomplicated: Secondary | ICD-10-CM | POA: Diagnosis not present

## 2016-05-18 DIAGNOSIS — I10 Essential (primary) hypertension: Secondary | ICD-10-CM | POA: Insufficient documentation

## 2016-05-18 DIAGNOSIS — R079 Chest pain, unspecified: Secondary | ICD-10-CM | POA: Diagnosis not present

## 2016-05-18 DIAGNOSIS — Z79899 Other long term (current) drug therapy: Secondary | ICD-10-CM | POA: Insufficient documentation

## 2016-05-18 DIAGNOSIS — R002 Palpitations: Secondary | ICD-10-CM | POA: Diagnosis not present

## 2016-05-18 DIAGNOSIS — I493 Ventricular premature depolarization: Secondary | ICD-10-CM | POA: Diagnosis not present

## 2016-05-18 LAB — BASIC METABOLIC PANEL
ANION GAP: 8 (ref 5–15)
BUN: 10 mg/dL (ref 6–20)
CALCIUM: 9.3 mg/dL (ref 8.9–10.3)
CHLORIDE: 103 mmol/L (ref 101–111)
CO2: 27 mmol/L (ref 22–32)
Creatinine, Ser: 1.06 mg/dL (ref 0.61–1.24)
GFR calc non Af Amer: >60 mL/min (ref >60)
Glucose, Bld: 110 mg/dL — ABNORMAL HIGH (ref 65–99)
Potassium: 3.3 mmol/L — ABNORMAL LOW (ref 3.5–5.1)
SODIUM: 138 mmol/L (ref 135–145)

## 2016-05-18 LAB — CBC WITH DIFFERENTIAL/PLATELET
BASOS ABS: 0 10*3/uL (ref 0.0–0.1)
BASOS PCT: 0 %
Eosinophils Absolute: 0.1 10*3/uL (ref 0.0–0.7)
Eosinophils Relative: 1 %
HEMATOCRIT: 47 % (ref 39.0–52.0)
HEMOGLOBIN: 16.3 g/dL (ref 13.0–17.0)
Lymphocytes Relative: 36 %
Lymphs Abs: 2.6 10*3/uL (ref 0.7–4.0)
MCH: 30.5 pg (ref 26.0–34.0)
MCHC: 34.7 g/dL (ref 30.0–36.0)
MCV: 87.9 fL (ref 78.0–100.0)
Monocytes Absolute: 0.5 10*3/uL (ref 0.1–1.0)
Monocytes Relative: 7 %
NEUTROS ABS: 4 10*3/uL (ref 1.7–7.7)
NEUTROS PCT: 56 %
Platelets: 406 10*3/uL — ABNORMAL HIGH (ref 150–400)
RBC: 5.35 MIL/uL (ref 4.22–5.81)
RDW: 12.6 % (ref 11.5–15.5)
WBC: 7.1 10*3/uL (ref 4.0–10.5)

## 2016-05-18 LAB — D-DIMER, QUANTITATIVE (NOT AT ARMC)

## 2016-05-18 LAB — TROPONIN I

## 2016-05-18 IMAGING — DX DG CHEST 2V
2 series · 2 of 2 positions shown · non-contrast
Comparison: [DATE]

CLINICAL DATA: Intermittent chest pain and palpitations for 3 days

EXAM:
CHEST  2 VIEW

[chest pa]
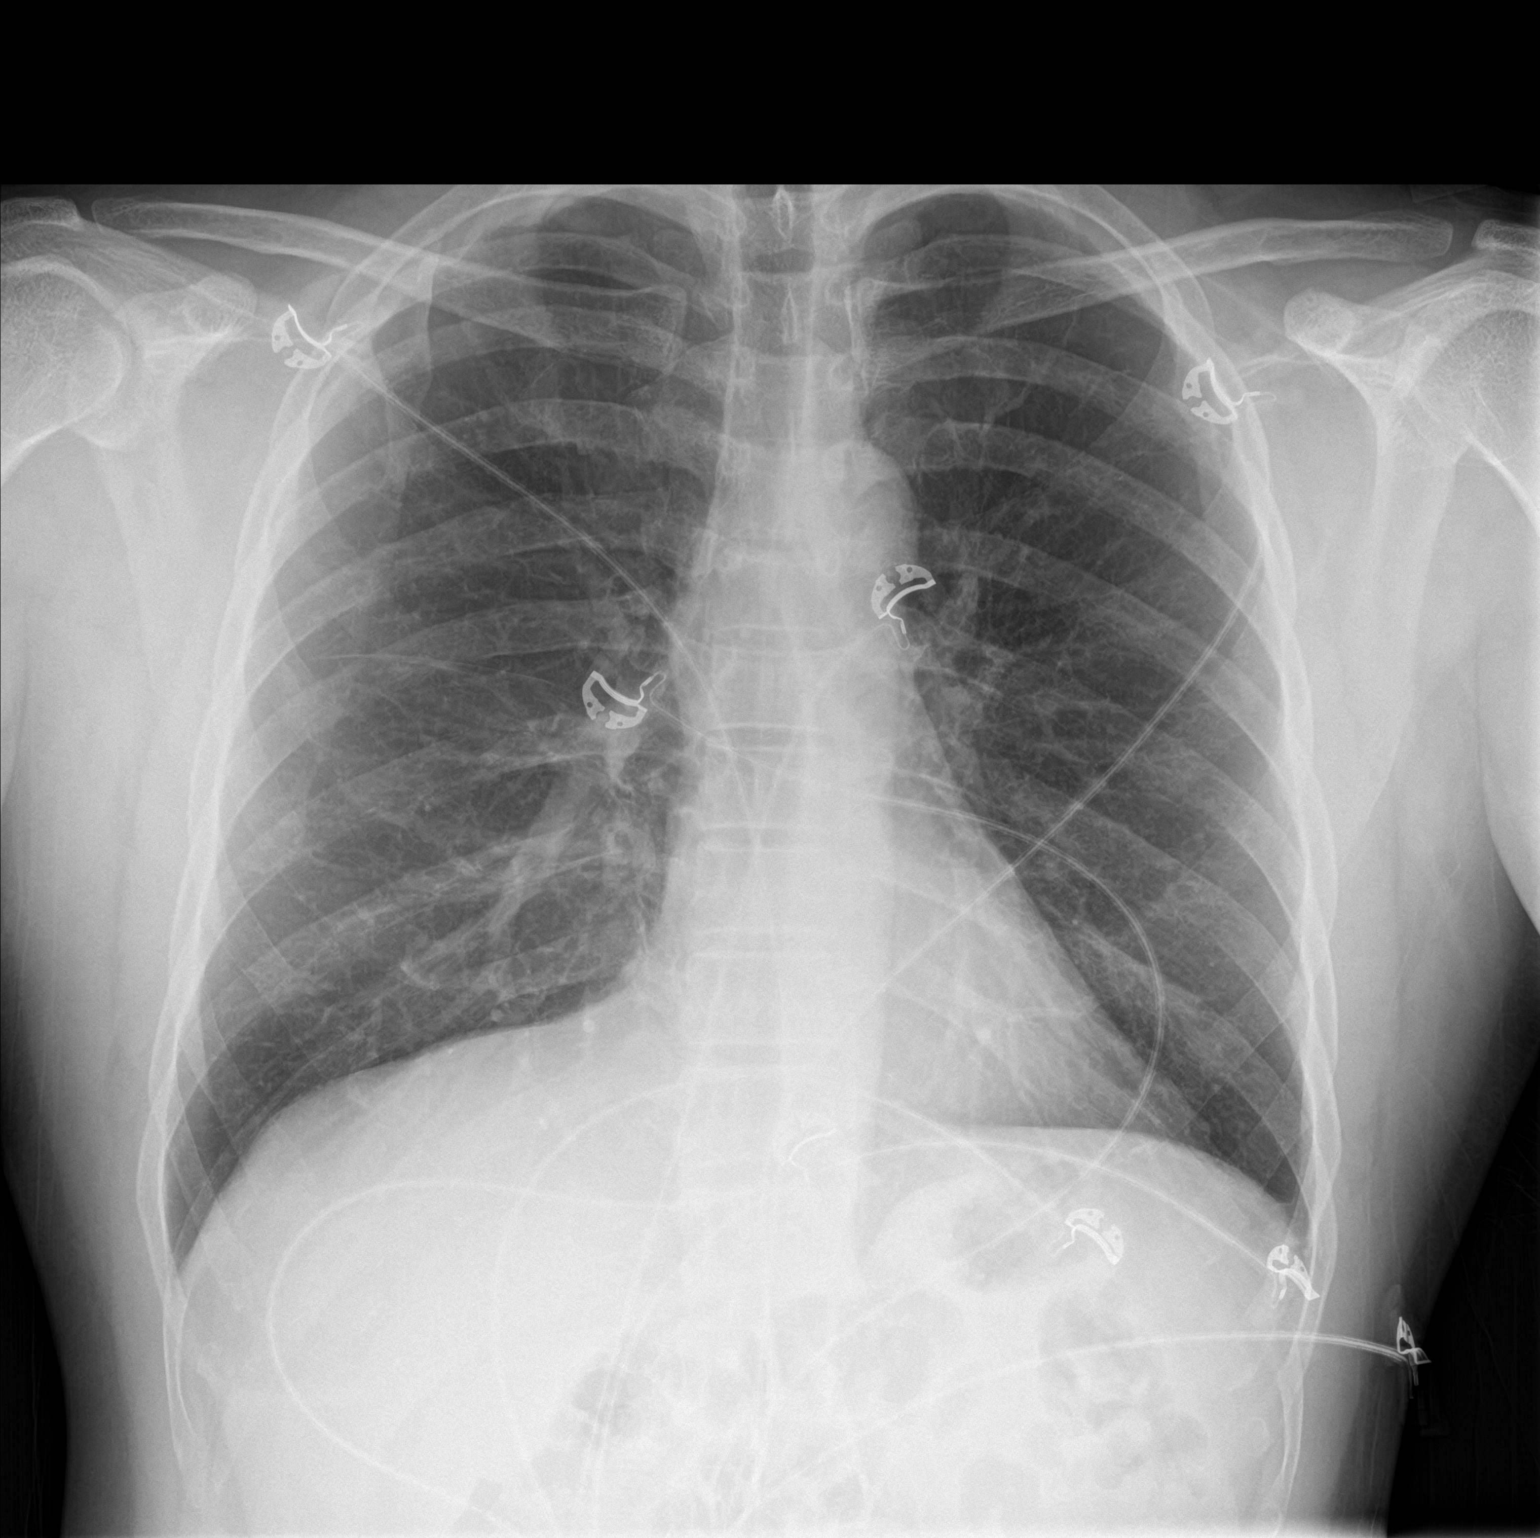

[chest lat]
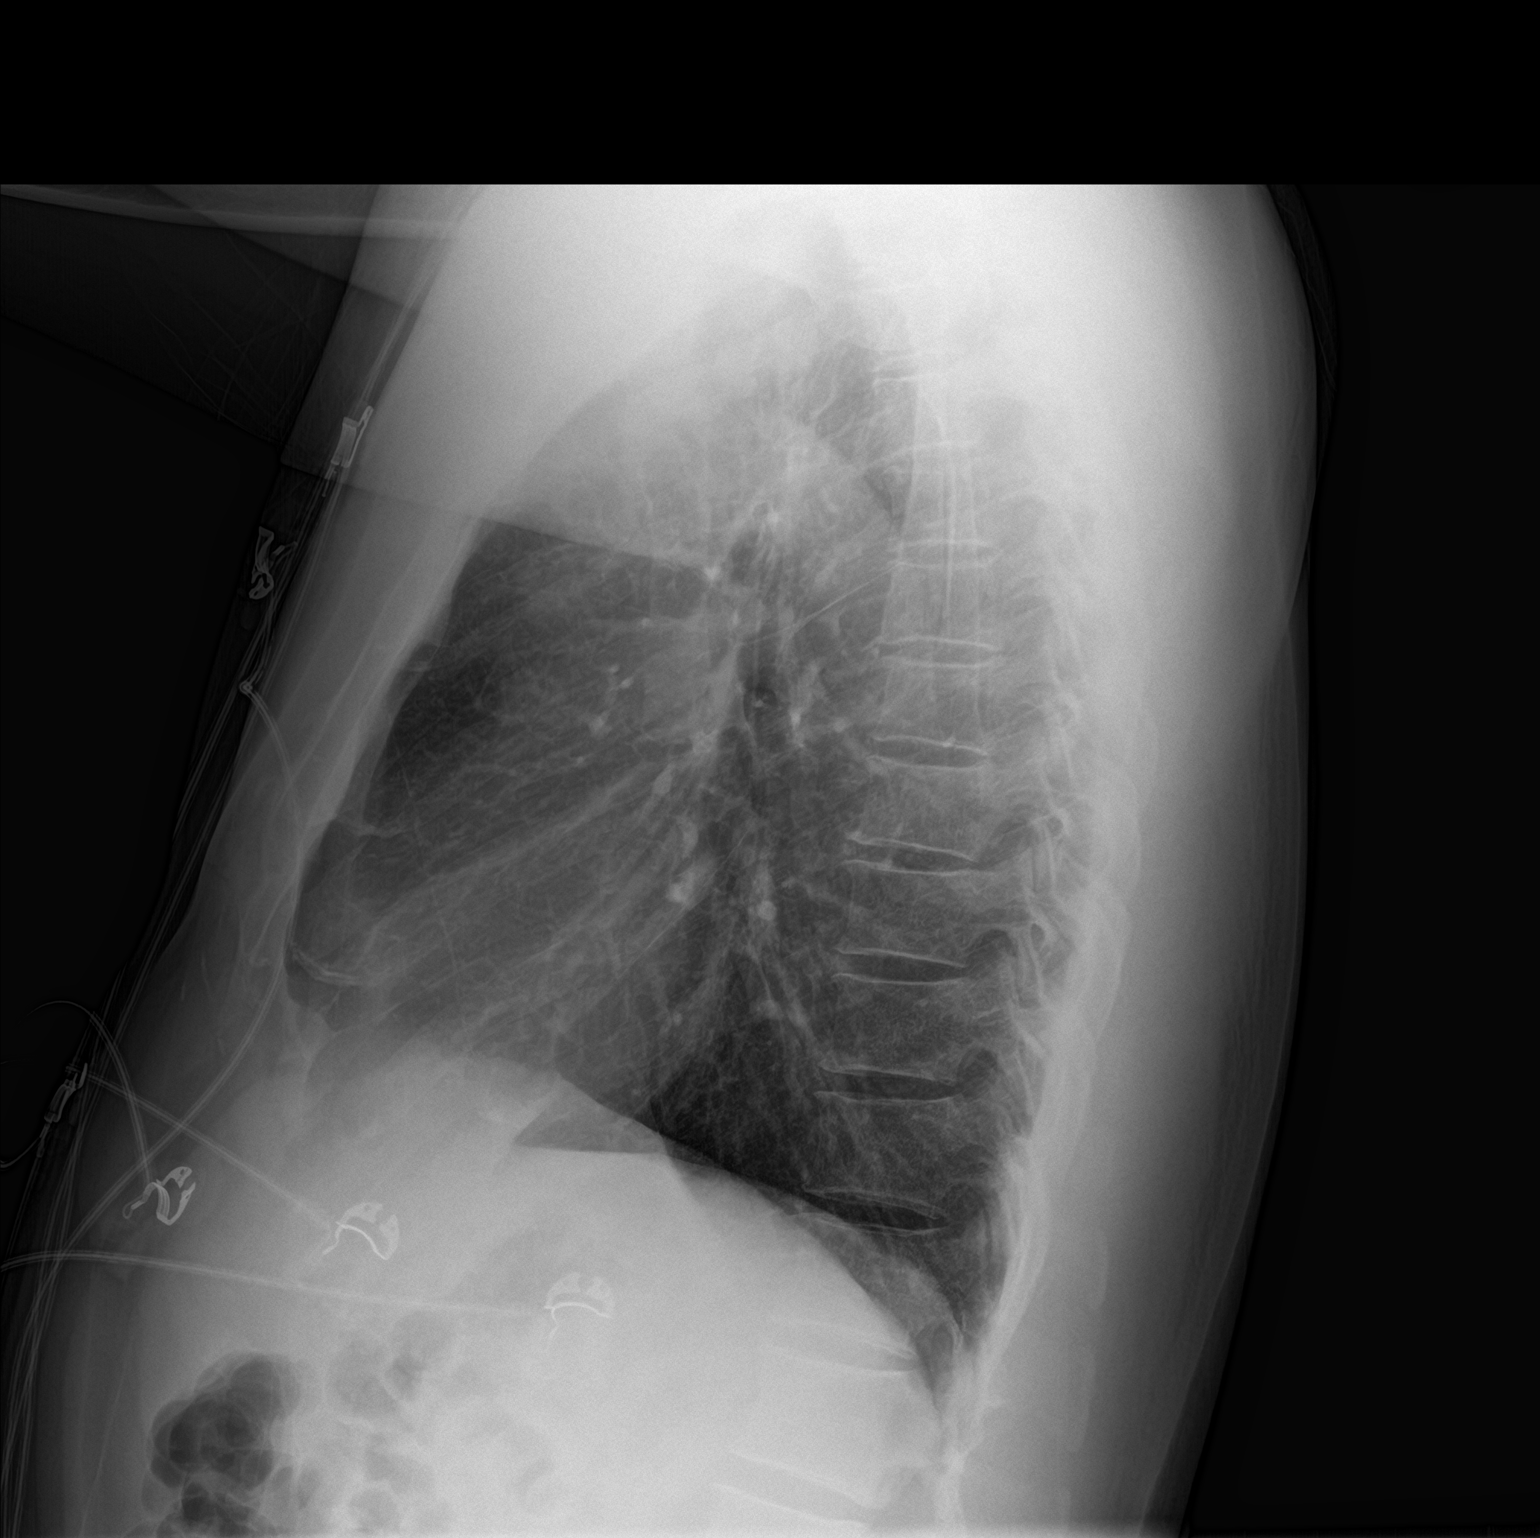

[2 of 2 positions shown; findings below may reference images not displayed]

FINDINGS: The heart size and mediastinal contours are within normal limits.
Both lungs are clear. The visualized skeletal structures are
unremarkable.
IMPRESSION: No active cardiopulmonary disease.

## 2016-05-18 MED ORDER — GI COCKTAIL ~~LOC~~: 30.0000 mL | Freq: Once | ORAL | Status: DC

## 2016-05-18 MED ORDER — POTASSIUM CHLORIDE CRYS ER 20 MEQ PO TBCR
40.0000 meq | EXTENDED_RELEASE_TABLET | Freq: Once | ORAL | Status: AC
  Administered 2016-05-18: 40 meq via ORAL
  Filled 2016-05-18: qty 2

## 2016-05-18 MED ORDER — ALUM & MAG HYDROXIDE-SIMETH 200-200-20 MG/5ML PO SUSP
30.0000 mL | Freq: Once | ORAL | Status: AC
  Administered 2016-05-18: 30 mL via ORAL
  Filled 2016-05-18: qty 30

## 2016-05-18 NOTE — Discharge Instructions (Signed)
No caffeine, no alcohol, no energy drinks, No vaping

## 2016-05-18 NOTE — ED Notes (Signed)
Pt verbalizes understanding of d/c instructions and denies any further needs at this time. 

## 2016-05-18 NOTE — ED Provider Notes (Signed)
[facility] Provider Note   CSN: [id number] Arrival date & time: 05/18/16  0036     History   Chief Complaint Chief Complaint  Patient presents with  . Palpitations    HPI Justin Ashley is a 41 y.o. male.  The history is provided by the patient.  Palpitations   This is a recurrent problem. The current episode started more than 2 days ago. The problem occurs constantly. The problem has not changed since onset.The problem is associated with an unknown factor. Pertinent negatives include no diaphoresis, no chest pain, no claudication, no exertional chest pressure, no irregular heartbeat, no near-syncope, no orthopnea, no PND, no syncope, no abdominal pain, no nausea, no vomiting, no headaches, no leg pain, no lower extremity edema, no weakness, no cough, no hemoptysis and no shortness of breath. Associated symptoms comments: Epigastric pain x 3 days is just finishing up treatment for H Pylori. He has tried nothing for the symptoms. The treatment provided no relief. Risk factors include being male. His past medical history does not include hyperthyroidism. Past medical history comments: H pYlori.  Abdominal Pain   This is a chronic problem. The current episode started more than 1 week ago. The problem has been rapidly improving. Associated with: H pylori. The pain is located in the epigastric region. Pertinent negatives include nausea, vomiting and headaches. Nothing aggravates the symptoms. Nothing relieves the symptoms. Past workup does not include surgery. His past medical history does not include irritable bowel syndrome. Past medical history comments: H pYlori.    Past Medical History:  Diagnosis Date  . Dislocation of metatarsophalangeal joint of right great toe   . Pneumonia age 22    Patient Active Problem List   Diagnosis Date Noted  . H. pylori infection 05/03/2016  . Dyspepsia 05/02/2016  . Palpitations 11/28/2015  . Panic attacks 06/14/2015  . Nicotine vapor  product user 10/11/2014  . Shortness of breath 10/11/2014  . Essential hypertension 06/30/2014  . Hypogonadism in male 12/15/2013  . Suicide attempt 12/10/2013  . Degenerative disc disease, cervical 03/09/2013  . History of thrombocytosis 03/09/2013  . Erectile dysfunction 12/24/2012    History reviewed. No pertinent surgical history.     Home Medications    Prior to Admission medications   Medication Sig Start Date End Date Taking? Authorizing Provider  AMBULATORY NON FORMULARY MEDICATION BD 37mL syringe and BD 22G 1 and 1/2" needle, use to inject testosterone every two weeks.  BD 18G 1 and 1/2" needle used to draw up testosterone. Dx: Hypogonadism 06/30/14   Marcial Pacas, DO  AMBULATORY NON FORMULARY MEDICATION ProAir RespiClick.  Inhale two puffs every four hours only as needed for shortness of breath or wheezing. Use savings voucher. 10/25/14   Sean Hommel, DO  busPIRone (BUSPAR) 5 MG tablet Take 1 tablet (5 mg total) by mouth 3 (three) times daily as needed (panic). 05/02/16   Gregor Hams, MD  fluticasone (FLONASE) 50 MCG/ACT nasal spray One spray in each nostril twice a day, use left hand for right nostril, and right hand for left nostril. 10/26/15   Sean Hommel, DO  losartan (COZAAR) 50 MG tablet Take 1.5 tablets (75 mg total) by mouth daily. 05/26/15   Sean Hommel, DO  omeprazole (PRILOSEC) 40 MG capsule Take 1 capsule (40 mg total) by mouth 2 (two) times daily. 05/03/16   Gregor Hams, MD  tadalafil (CIALIS) 20 MG tablet Take 0.5-1 tablets (10-20 mg total) by mouth every other day as needed for  erectile dysfunction. 10/26/15   Marcial Pacas, DO  testosterone cypionate (DEPOTESTOSTERONE CYPIONATE) 200 MG/ML injection Inject 1.2 mLs (240 mg total) into the muscle every 14 (fourteen) days. 05/02/16 07/31/16  Gregor Hams, MD    Family History Family History  Problem Relation Age of Onset  . Cancer Mother     lung  . Cancer Father     stomach, deceased @  31yrs    Social  History Social History  Substance Use Topics  . Smoking status: Current Every Day Smoker    Years: 22.00    Types: Cigarettes  . Smokeless tobacco: Never Used     Comment: e-cigs smoker x 3 years  . Alcohol use No     Allergies   Lisinopril and Zoloft [sertraline hcl]   Review of Systems Review of Systems  Constitutional: Negative for diaphoresis.  Respiratory: Negative for cough, hemoptysis and shortness of breath.   Cardiovascular: Positive for palpitations. Negative for chest pain, orthopnea, claudication, leg swelling, syncope, PND and near-syncope.  Gastrointestinal: Negative for abdominal pain, nausea and vomiting.  Neurological: Negative for weakness and headaches.  All other systems reviewed and are negative.    Physical Exam Updated Vital Signs BP 140/88 (BP Location: Right Arm)   Pulse 89   Temp 98.2 F (36.8 C) (Oral)   Resp 18   Wt 175 lb (79.4 kg)   SpO2 100%   BMI 26.61 kg/m   Physical Exam  Constitutional: He is oriented to person, place, and time. He appears well-developed and well-nourished. No distress.  HENT:  Head: Normocephalic and atraumatic.  Mouth/Throat: No oropharyngeal exudate.  Eyes: Conjunctivae and EOM are normal. Pupils are equal, round, and reactive to light.  Neck: Normal range of motion. Neck supple. No JVD present.  Cardiovascular: Normal rate, regular rhythm, normal heart sounds and intact distal pulses.   Pulmonary/Chest: Effort normal and breath sounds normal. No stridor. He has no wheezes. He has no rales.  Abdominal: Soft. Bowel sounds are normal. He exhibits no mass. There is no tenderness. There is no rebound and no guarding.  Musculoskeletal: Normal range of motion. He exhibits no edema, tenderness or deformity.  Lymphadenopathy:    He has no cervical adenopathy.  Neurological: He is alert and oriented to person, place, and time.  Skin: Skin is warm and dry. Capillary refill takes less than 2 seconds. He is not  diaphoretic.  Psychiatric: His mood appears anxious.     ED Treatments / Results   Vitals:   05/18/16 0043  BP: 140/88  Pulse: 89  Resp: 18  Temp: 98.2 F (36.8 C)    EKG  EKG Interpretation  Date/Time:  Friday May 18 2016 00:45:48 EST Ventricular Rate:  88 PR Interval:    QRS Duration: 91 QT Interval:  350 QTC Calculation: 424 R Axis:   57 Text Interpretation:  Sinus rhythm 2 pvcs Baseline wander in lead(s) I II III aVR aVL Confirmed by [facility]  MD, Tiffney Haughton ([id number]) on 05/18/2016 12:56:45 AM       Radiology No results found.  Procedures Procedures (including critical care time)  Medications Ordered in ED Medications  alum & mag hydroxide-simeth (MAALOX/MYLANTA) 200-200-20 MG/5ML suspension 30 mL (not administered)     Wells 0, highly doubt PE in this very low risk patient.  Final Clinical Impressions(s) / ED Diagnoses  PVCs:  Stable vitals. Benign exam and work up.  Suspect anxiety is the cause because as soon as patient gets any information related to his  health PVC become more frequent, but patient has modifiable factors such as caffeine intake, vaping, needs to follow up with PMD for potassium recheck and TSH testing as well as potential change of BP medication to a class such as beta blocker or calcium channel blocker. Was also given cardiology follow up     Pertinent labs were available during my care of the patient were reviewed by me and considered in my medical decision making.  After history, exam, and medical workup I feel the patient has been appropriately medically screened and is safe for discharge home. Pertinent diagnoses were discussed with the patient. Patient was given return precautions. Return immediately for Dyspnea on exertion, exertion chest pain, Rapid heart rate, shortness of breath, lightheadedness or any concerns. Follow up with your PMD for recheck in 2 days.        Veatrice Kells, MD 05/18/16 [phone]

## 2016-05-18 NOTE — ED Triage Notes (Signed)
Patient states that he has been checking his BP at home and it is normal. He reports that he is having "irregular heart beats" - Patient reports that he has had intermittent chest pain and palpitations for about 3 days

## 2016-05-18 NOTE — ED Notes (Signed)
Patient transported to X-ray 

## 2016-05-23 ENCOUNTER — Ambulatory Visit (INDEPENDENT_AMBULATORY_CARE_PROVIDER_SITE_OTHER): Payer: BLUE CROSS/BLUE SHIELD | Admitting: Family Medicine

## 2016-05-23 VITALS — BP 138/81 | HR 77 | Wt 175.0 lb

## 2016-05-23 DIAGNOSIS — R002 Palpitations: Secondary | ICD-10-CM

## 2016-05-23 DIAGNOSIS — I10 Essential (primary) hypertension: Secondary | ICD-10-CM | POA: Diagnosis not present

## 2016-05-23 DIAGNOSIS — E876 Hypokalemia: Secondary | ICD-10-CM | POA: Insufficient documentation

## 2016-05-23 NOTE — Progress Notes (Signed)
Justin Ashley is a 41 y.o. male who presents to [facility]: [place] today for follow-up palpitations. Patient was seen in the emergency department on March 9 for palpitations and chest pain. He was found to have mildly low potassium at 3.3 and 2 PVCs on EKG. He was given some oral potassium supplementation and he discontinued his oral antibiotics as they weren't actually going to expire. Since then he's feeling a bit better no significantly reduced palpitations. He denies fevers chills nausea vomiting or diarrhea. He continues taking losartan.   Past Medical History:  Diagnosis Date  . Dislocation of metatarsophalangeal joint of right great toe   . Pneumonia age 8   No past surgical history on file. Social History  Substance Use Topics  . Smoking status: Current Every Day Smoker    Years: 22.00    Types: Cigarettes  . Smokeless tobacco: Never Used     Comment: e-cigs smoker x 3 years  . Alcohol use No   family history includes Cancer in his father and mother.  ROS as above:  Medications: Current Outpatient Prescriptions  Medication Sig Dispense Refill  . AMBULATORY NON FORMULARY MEDICATION BD 6mL syringe and BD 22G 1 and 1/2" needle, use to inject testosterone every two weeks.  BD 18G 1 and 1/2" needle used to draw up testosterone. Dx: Hypogonadism 20 Units 11  . AMBULATORY NON FORMULARY MEDICATION ProAir RespiClick.  Inhale two puffs every four hours only as needed for shortness of breath or wheezing. Use savings voucher. 1 Inhaler 0  . busPIRone (BUSPAR) 5 MG tablet Take 1 tablet (5 mg total) by mouth 3 (three) times daily as needed (panic). 30 tablet 0  . fluticasone (FLONASE) 50 MCG/ACT nasal spray One spray in each nostril twice a day, use left hand for right nostril, and right hand for left nostril. 48 g 3  . losartan (COZAAR) 50 MG tablet Take 1.5 tablets (75 mg  total) by mouth daily. 90 tablet 3  . omeprazole (PRILOSEC) 40 MG capsule Take 1 capsule (40 mg total) by mouth 2 (two) times daily. 60 capsule 0  . tadalafil (CIALIS) 20 MG tablet Take 0.5-1 tablets (10-20 mg total) by mouth every other day as needed for erectile dysfunction. 5 tablet 11  . testosterone cypionate (DEPOTESTOSTERONE CYPIONATE) 200 MG/ML injection Inject 1.2 mLs (240 mg total) into the muscle every 14 (fourteen) days. 12 mL 5   No current facility-administered medications for this visit.    Allergies  Allergen Reactions  . Lisinopril     Cough  . Zoloft [Sertraline Hcl]     Suicide attempt    Health Maintenance Health Maintenance  Topic Date Due  . TETANUS/TDAP  05/02/2026  . INFLUENZA VACCINE  Completed  . HIV Screening  Completed     Exam:  BP 138/81   Pulse 77   Wt 175 lb (79.4 kg)   BMI 26.61 kg/m  Gen: Well NAD HEENT: EOMI,  MMM Lungs: Normal work of breathing. CTABL Heart: RRR no MRG Abd: NABS, Soft. Nondistended, Nontender Exts: Brisk capillary refill, warm and well perfused.     Chemistry      Component Value Date/Time   NA 138 05/18/2016 0045   K 3.3 (L) 05/18/2016 0045   CL 103 05/18/2016 0045   CO2 27 05/18/2016 0045   BUN 10 05/18/2016 0045   CREATININE 1.06 05/18/2016 0045   CREATININE 1.02 04/30/2016 0902  Component Value Date/Time   CALCIUM 9.3 05/18/2016 0045   ALKPHOS 58 04/30/2016 0902   AST 13 04/30/2016 0902   ALT 14 04/30/2016 0902   BILITOT 0.5 04/30/2016 0902        No results found for this or any previous visit (from the past 3 hour(s)). No results found.    Assessment and Plan: 41 y.o. male with PVC likely due to mild hypokalemia. Unclear etiology for hypokalemia however. The only significant change was recent triple therapy for H. pylori. This is expired and patient is no longer taking clarithromycin or amoxicillin. These were the likely culprits for hypokalemia. Plan to recheck metabolic panel and  magnesium. If still low will consider other alternatives.   Orders Placed This Encounter  Procedures  . BASIC METABOLIC PANEL WITH GFR  . Magnesium   No orders of the defined types were placed in this encounter.    Discussed warning signs or symptoms. Please see discharge instructions. Patient expresses understanding.

## 2016-05-23 NOTE — Patient Instructions (Addendum)
Thank you for coming in today. Get labs today.  We will recheck labs soon.    (Low Potassium) Hypokalemia Hypokalemia means that the amount of potassium in the blood is lower than normal.Potassium is a chemical that helps regulate the amount of fluid in the body (electrolyte). It also stimulates muscle tightening (contraction) and helps nerves work properly.Normally, most of the body's potassium is inside of cells, and only a very small amount is in the blood. Because the amount in the blood is so small, minor changes to potassium levels in the blood can be life-threatening. What are the causes? This condition may be caused by:  Antibiotic medicine.  Diarrhea or vomiting. Taking too much of a medicine that helps you have a bowel movement (laxative) can cause diarrhea and lead to hypokalemia.  Chronic kidney disease (CKD).  Medicines that help the body get rid of excess fluid (diuretics).  Eating disorders, such as bulimia.  Low magnesium levels in the body.  Sweating a lot. What are the signs or symptoms? Symptoms of this condition include:  Weakness.  Constipation.  Fatigue.  Muscle cramps.  Mental confusion.  Skipped heartbeats or irregular heartbeat (palpitations).  Tingling or numbness. How is this diagnosed? This condition is diagnosed with a blood test. How is this treated? Hypokalemia can be treated by taking potassium supplements by mouth or adjusting the medicines that you take. Treatment may also include eating more foods that contain a lot of potassium. If your potassium level is very low, you may need to get potassium through an IV tube in one of your veins and be monitored in the hospital. Follow these instructions at home:  Take over-the-counter and prescription medicines only as told by your health care provider. This includes vitamins and supplements.  Eat a healthy diet. A healthy diet includes fresh fruits and vegetables, whole grains, healthy fats,  and lean proteins.  If instructed, eat more foods that contain a lot of potassium, such as:  Nuts, such as peanuts and pistachios.  Seeds, such as sunflower seeds and pumpkin seeds.  Peas, lentils, and lima beans.  Whole grain and bran cereals and breads.  Fresh fruits and vegetables, such as apricots, avocado, bananas, cantaloupe, kiwi, oranges, tomatoes, asparagus, and potatoes.  Orange juice.  Tomato juice.  Red meats.  Yogurt.  Keep all follow-up visits as told by your health care provider. This is important. Contact a health care provider if:  You have weakness that gets worse.  You feel your heart pounding or racing.  You vomit.  You have diarrhea.  You have diabetes (diabetes mellitus) and you have trouble keeping your blood sugar (glucose) in your target range. Get help right away if:  You have chest pain.  You have shortness of breath.  You have vomiting or diarrhea that lasts for more than 2 days.  You faint. This information is not intended to replace advice given to you by your health care provider. Make sure you discuss any questions you have with your health care provider. Document Released: 02/26/2005 Document Revised: 10/15/2015 Document Reviewed: 10/15/2015 Elsevier Interactive Patient Education  2017 [employer]   Premature Ventricular Contraction A premature ventricular contraction (PVC) is a common irregularity in the normal heart rhythm. These contractions are extra heartbeats that start in the heart ventricles and occur too early in the normal sequence. During the PVC, the heart's normal electrical pathway is not used, so the beat is shorter and less effective. In most cases, these contractions come and  go and do not require treatment. What are the causes? In many cases, the cause may not be known. Common causes of the condition include:  Smoking.  Drinking alcohol.  Caffeine.  Certain medicines.  Some illegal  drugs.  Stress. Certain medical conditions can also cause PVCs:  Changes in minerals in the blood (electrolytes).  Heart failure.  Heart valve problems.  Low blood oxygen levels or high carbon dioxide levels.  Heart attack, or coronary artery disease. What are the signs or symptoms? The main symptom of this condition is a fast or skipped heartbeat (palpitations). Other symptoms include:  Chest pain.  Shortness of breath.  Feeling tired.  Dizziness. In some cases, there are no symptoms. How is this diagnosed? This condition may be diagnosed based on:  Your medical history.  A physical exam. During the exam, the health care provider will check for irregular heartbeats.  Tests, such as:  An ECG (electrocardiogram) to monitor the electrical activity of your heart.  Holter monitor testing. This involves wearing a device that clips to your clothing and monitors the electrical activity of your heart over longer periods of time.  Stress tests to see how exercise affects your heart rhythm and blood supply.  Echocardiogram. This test uses sound waves (ultrasound) to produce an image of your heart.  Electrophysiology study. This test checks the electric pathways in your heart. How is this treated? Treatment depends on any underlying conditions, the type of PVCs that you are having, and how much the symptoms are interfering with your daily life. Possible treatments include:  Avoiding things that can trigger the premature contractions, such as caffeine or alcohol.  Medicines. These may be given if symptoms are severe or if the extra heartbeats are frequent.  Treatment for any underlying condition that is found to be the cause of the contractions.  Catheter ablation. This procedure destroys the heart tissues that send abnormal signals. In some cases, no treatment is required. Follow these instructions at home: Lifestyle  Follow these instructions as told by your health care  provider:  Do not use any products that contain nicotine or tobacco, such as cigarettes and e-cigarettes. If you need help quitting, ask your health care provider.  If caffeine triggers episodes of PVC, do not eat, drink, or use anything with caffeine in it.  If caffeine does not seem to trigger episodes, consume caffeine in moderation.  If alcohol triggers episodes of PVC, do not drink alcohol.  If alcohol does not seem to trigger episodes, limit alcohol intake to no more than 1 drink a day for nonpregnant women and 2 drinks a day for men. One drink equals 12 oz of beer, 5 oz of wine, or 1 oz of hard liquor.  Exercise regularly. Ask your health care provider what type of exercise is safe for you.  Find healthy ways to manage stress. Avoid stressful situations when possible.  Try to get at least 7-9 hours of sleep each night, or as much as recommended by your health care provider.  Do not use illegal drugs. General instructions   Take over-the-counter and prescription medicines only as told by your health care provider.  Keep all follow-up visits as told by your health care provider. This is important. Get help right away if:  You feel palpitations that are frequent or continual.  You have chest pain.  You have shortness of breath.  You have sweating for no reason.  You have nausea and vomiting.  You become light-headed or  you faint. This information is not intended to replace advice given to you by your health care provider. Make sure you discuss any questions you have with your health care provider. Document Released: 10/14/2003 Document Revised: 10/21/2015 Document Reviewed: 08/03/2015 Elsevier Interactive Patient Education  2017 [employer].

## 2016-05-24 LAB — BASIC METABOLIC PANEL WITH GFR
BUN: 10 mg/dL (ref 7–25)
CALCIUM: 9.4 mg/dL (ref 8.6–10.3)
CHLORIDE: 103 mmol/L (ref 98–110)
CO2: 27 mmol/L (ref 20–31)
CREATININE: 1.07 mg/dL (ref 0.60–1.35)
GFR, Est Non African American: 85 mL/min (ref >=60)
Glucose, Bld: 86 mg/dL (ref 65–99)
Potassium: 4.5 mmol/L (ref 3.5–5.3)
SODIUM: 139 mmol/L (ref 135–146)

## 2016-05-24 LAB — MAGNESIUM: MAGNESIUM: 2.2 mg/dL (ref 1.5–2.5)

## 2016-06-10 ENCOUNTER — Encounter: Payer: Self-pay | Admitting: Family Medicine

## 2016-06-12 ENCOUNTER — Encounter: Payer: Self-pay | Admitting: Family Medicine

## 2016-06-12 ENCOUNTER — Ambulatory Visit (INDEPENDENT_AMBULATORY_CARE_PROVIDER_SITE_OTHER): Payer: BLUE CROSS/BLUE SHIELD | Admitting: Family Medicine

## 2016-06-12 VITALS — BP 139/80 | HR 89 | Wt 175.0 lb

## 2016-06-12 DIAGNOSIS — R002 Palpitations: Secondary | ICD-10-CM | POA: Diagnosis not present

## 2016-06-12 DIAGNOSIS — I10 Essential (primary) hypertension: Secondary | ICD-10-CM | POA: Diagnosis not present

## 2016-06-12 MED ORDER — CLONAZEPAM 0.5 MG PO TABS: 0.5000 mg | ORAL_TABLET | Freq: Two times a day (BID) | ORAL | 0 refills | Status: DC | PRN

## 2016-06-12 MED ORDER — LOSARTAN POTASSIUM 50 MG PO TABS: 50.0000 mg | ORAL_TABLET | Freq: Every day | ORAL | 3 refills | Status: DC

## 2016-06-12 MED ORDER — METOPROLOL SUCCINATE ER 25 MG PO TB24: 25.0000 mg | ORAL_TABLET | Freq: Every day | ORAL | 1 refills | Status: DC

## 2016-06-12 NOTE — Progress Notes (Signed)
Justin Ashley is a 41 y.o. male who presents to [facility] today for palpitations. Patient has continued bothersome palpitations associated with anxiety. This interferes with his ability to sleep. He denies chest pain trouble breathing or loss of function. He's had a workup previously in September included a Holter monitor echocardiogram stress test that was relatively unremarkable. He notes the palpitations have worsened a bit recently. He was seen in the emergency room about a month ago for this and found to have a low potassium level associated with frequent PVC. I saw him for follow-up about a month ago when he was feeling better. His symptoms have returned and worsened slightly.   Past Medical History:  Diagnosis Date  . Dislocation of metatarsophalangeal joint of right great toe   . Pneumonia age 46   No past surgical history on file. Social History  Substance Use Topics  . Smoking status: Current Every Day Smoker    Years: 22.00    Types: Cigarettes  . Smokeless tobacco: Never Used     Comment: e-cigs smoker x 3 years  . Alcohol use No   family history includes Cancer in his father and mother.  ROS as above:  Medications: Current Outpatient Prescriptions  Medication Sig Dispense Refill  . AMBULATORY NON FORMULARY MEDICATION BD 6mL syringe and BD 22G 1 and 1/2" needle, use to inject testosterone every two weeks.  BD 18G 1 and 1/2" needle used to draw up testosterone. Dx: Hypogonadism 20 Units 11  . AMBULATORY NON FORMULARY MEDICATION ProAir RespiClick.  Inhale two puffs every four hours only as needed for shortness of breath or wheezing. Use savings voucher. 1 Inhaler 0  . busPIRone (BUSPAR) 5 MG tablet Take 1 tablet (5 mg total) by mouth 3 (three) times daily as needed (panic). 30 tablet 0  . fluticasone (FLONASE) 50 MCG/ACT nasal spray One spray in each  nostril twice a day, use left hand for right nostril, and right hand for left nostril. 48 g 3  . losartan (COZAAR) 50 MG tablet Take 1 tablet (50 mg total) by mouth daily. 90 tablet 3  . omeprazole (PRILOSEC) 40 MG capsule Take 1 capsule (40 mg total) by mouth 2 (two) times daily. 60 capsule 0  . tadalafil (CIALIS) 20 MG tablet Take 0.5-1 tablets (10-20 mg total) by mouth every other day as needed for erectile dysfunction. 5 tablet 11  . testosterone cypionate (DEPOTESTOSTERONE CYPIONATE) 200 MG/ML injection Inject 1.2 mLs (240 mg total) into the muscle every 14 (fourteen) days. 12 mL 5  . clonazePAM (KLONOPIN) 0.5 MG tablet Take 1 tablet (0.5 mg total) by mouth 2 (two) times daily as needed for anxiety. 30 tablet 0  . metoprolol succinate (TOPROL XL) 25 MG 24 hr tablet Take 1 tablet (25 mg total) by mouth daily. 30 tablet 1   No current facility-administered medications for this visit.    Allergies  Allergen Reactions  . Lisinopril     Cough  . Zoloft [Sertraline Hcl]     Suicide attempt    Health Maintenance Health Maintenance  Topic Date Due  . INFLUENZA VACCINE  10/10/2016  . TETANUS/TDAP  05/02/2026  . HIV Screening  Completed     Exam:  BP 139/80   Pulse 89   Wt 175 lb (79.4 kg)   SpO2 100%   BMI 26.61 kg/m  Gen: Well NAD HEENT: EOMI,  MMM Lungs: Normal work of breathing. CTABL Heart:  RRR no MRG Abd: NABS, Soft. Nondistended, Nontender Exts: Brisk capillary refill, warm and well perfused.    No results found for this or any previous visit (from the past 72 hour(s)). No results found.    Assessment and Plan: 41 y.o. male with Palpitations with history of PVC. We'll check metabolic panel. Will treat empirically with low-dose extended release metoprolol. Additionally we'll use clonazepam at bedtime as an anxiolytic. Recheck in one month. Additionally we'll reobtain Holter monitor.   Orders Placed This Encounter  Procedures  . BASIC METABOLIC PANEL WITH GFR  .  Holter monitor - 24 hour    Standing Status:   Future    Standing Expiration Date:   06/13/2026    Order Specific Question:   Where should this test be performed?    Answer:   [facility]   Meds ordered this encounter  Medications  . clonazePAM (KLONOPIN) 0.5 MG tablet    Sig: Take 1 tablet (0.5 mg total) by mouth 2 (two) times daily as needed for anxiety.    Dispense:  30 tablet    Refill:  0  . metoprolol succinate (TOPROL XL) 25 MG 24 hr tablet    Sig: Take 1 tablet (25 mg total) by mouth daily.    Dispense:  30 tablet    Refill:  1  . losartan (COZAAR) 50 MG tablet    Sig: Take 1 tablet (50 mg total) by mouth daily.    Dispense:  90 tablet    Refill:  3     Discussed warning signs or symptoms. Please see discharge instructions. Patient expresses understanding.

## 2016-06-12 NOTE — Patient Instructions (Signed)
Thank you for coming in today. Continue losartan.  Take metoprolol daily.  Take klonopin at bedtime.  Recheck in 1 month.  Get labs today.  You should hear about the holter monitor soon.   Call or go to the emergency room if you get worse, have trouble breathing, have chest pains, or palpitations.   Metoprolol extended-release tablets What is this medicine? METOPROLOL (me TOE proe lole) is a beta-blocker. Beta-blockers reduce the workload on the heart and help it to beat more regularly. This medicine is used to treat high blood pressure and to prevent chest pain. It is also used to after a heart attack and to prevent an additional heart attack from occurring. This medicine may be used for other purposes; ask your health care provider or pharmacist if you have questions. COMMON BRAND NAME(S): toprol, Toprol XL What should I tell my health care provider before I take this medicine? They need to know if you have any of these conditions: -diabetes -heart or vessel disease like slow heart rate, worsening heart failure, heart block, sick sinus syndrome or Raynaud's disease -kidney disease -liver disease -lung or breathing disease, like asthma or emphysema -pheochromocytoma -thyroid disease -an unusual or allergic reaction to metoprolol, other beta-blockers, medicines, foods, dyes, or preservatives -pregnant or trying to get pregnant -breast-feeding How should I use this medicine? Take this medicine by mouth with a glass of water. Follow the directions on the prescription label. Do not crush or chew. Take this medicine with or immediately after meals. Take your doses at regular intervals. Do not take more medicine than directed. Do not stop taking this medicine suddenly. This could lead to serious heart-related effects. Talk to your pediatrician regarding the use of this medicine in children. While this drug may be prescribed for children as young as 6 years for selected conditions, precautions  do apply. Overdosage: If you think you have taken too much of this medicine contact a poison control center or emergency room at once. NOTE: This medicine is only for you. Do not share this medicine with others. What if I miss a dose? If you miss a dose, take it as soon as you can. If it is almost time for your next dose, take only that dose. Do not take double or extra doses. What may interact with this medicine? This medicine may interact with the following medications: -certain medicines for blood pressure, heart disease, irregular heart beat -certain medicines for depression, like monoamine oxidase (MAO) inhibitors, fluoxetine, or paroxetine -clonidine -dobutamine -epinephrine -isoproterenol -reserpine This list may not describe all possible interactions. Give your health care provider a list of all the medicines, herbs, non-prescription drugs, or dietary supplements you use. Also tell them if you smoke, drink alcohol, or use illegal drugs. Some items may interact with your medicine. What should I watch for while using this medicine? Visit your doctor or health care professional for regular check ups. Contact your doctor right away if your symptoms worsen. Check your blood pressure and pulse rate regularly. Ask your health care professional what your blood pressure and pulse rate should be, and when you should contact them. You may get drowsy or dizzy. Do not drive, use machinery, or do anything that needs mental alertness until you know how this medicine affects you. Do not sit or stand up quickly, especially if you are an older patient. This reduces the risk of dizzy or fainting spells. Contact your doctor if these symptoms continue. Alcohol may interfere with the effect of  this medicine. Avoid alcoholic drinks. What side effects may I notice from receiving this medicine? Side effects that you should report to your doctor or health care professional as soon as possible: -allergic reactions  like skin rash, itching or hives -cold or numb hands or feet -depression -difficulty breathing -faint -fever with sore throat -irregular heartbeat, chest pain -rapid weight gain -swollen legs or ankles Side effects that usually do not require medical attention (report to your doctor or health care professional if they continue or are bothersome): -anxiety or nervousness -change in sex drive or performance -dry skin -headache -nightmares or trouble sleeping -short term memory loss -stomach upset or diarrhea -unusually tired This list may not describe all possible side effects. Call your doctor for medical advice about side effects. You may report side effects to FDA at 1-800-FDA-1088. Where should I keep my medicine? Keep out of the reach of children. Store at room temperature between 15 and 30 degrees C (59 and 86 degrees F). Throw away any unused medicine after the expiration date. NOTE: This sheet is a summary. It may not cover all possible information. If you have questions about this medicine, talk to your doctor, pharmacist, or health care provider.  2018 Elsevier/Gold Standard (2012-10-31 14:41:37)   Clonazepam tablets What is this medicine? CLONAZEPAM (kloe NA ze pam) is a benzodiazepine. It is used to treat certain types of seizures. It is also used to treat panic disorder. This medicine may be used for other purposes; ask your health care provider or pharmacist if you have questions. COMMON BRAND NAME(S): Ceberclon, Klonopin What should I tell my health care provider before I take this medicine? They need to know if you have any of these conditions: -an alcohol or drug abuse problem -bipolar disorder, depression, psychosis or other mental health condition -glaucoma -kidney or liver disease -lung or breathing disease -myasthenia gravis -Parkinson's disease -porphyria -seizures or a history of seizures -suicidal thoughts -an unusual or allergic reaction to  clonazepam, other benzodiazepines, foods, dyes, or preservatives -pregnant or trying to get pregnant -breast-feeding How should I use this medicine? Take this medicine by mouth with a glass of water. Follow the directions on the prescription label. If it upsets your stomach, take it with food or milk. Take your medicine at regular intervals. Do not take it more often than directed. Do not stop taking or change the dose except on the advice of your doctor or health care professional. A special MedGuide will be given to you by the pharmacist with each prescription and refill. Be sure to read this information carefully each time. Talk to your pediatrician regarding the use of this medicine in children. Special care may be needed. Overdosage: If you think you have taken too much of this medicine contact a poison control center or emergency room at once. NOTE: This medicine is only for you. Do not share this medicine with others. What if I miss a dose? If you miss a dose, take it as soon as you can. If it is almost time for your next dose, take only that dose. Do not take double or extra doses. What may interact with this medicine? Do not take this medication with any of the following medicines: -narcotic medicines for cough -sodium oxybate This medicine may also interact with the following medications: -alcohol -antihistamines for allergy, cough and cold -antiviral medicines for HIV or AIDS -certain medicines for anxiety or sleep -certain medicines for depression, like amitriptyline, fluoxetine, sertraline -certain medicines for fungal infections  like ketoconazole and itraconazole -certain medicines for seizures like carbamazepine, phenobarbital, phenytoin, primidone -general anesthetics like halothane, isoflurane, methoxyflurane, propofol -local anesthetics like lidocaine, pramoxine, tetracaine -medicines that relax muscles for surgery -narcotic medicines for pain -phenothiazines like  chlorpromazine, mesoridazine, prochlorperazine, thioridazine This list may not describe all possible interactions. Give your health care provider a list of all the medicines, herbs, non-prescription drugs, or dietary supplements you use. Also tell them if you smoke, drink alcohol, or use illegal drugs. Some items may interact with your medicine. What should I watch for while using this medicine? Tell your doctor or health care professional if your symptoms do not start to get better or if they get worse. Do not stop taking except on your doctor's advice. You may develop a severe reaction. Your doctor will tell you how much medicine to take. You may get drowsy or dizzy. Do not drive, use machinery, or do anything that needs mental alertness until you know how this medicine affects you. To reduce the risk of dizzy and fainting spells, do not stand or sit up quickly, especially if you are an older patient. Alcohol may increase dizziness and drowsiness. Avoid alcoholic drinks. If you are taking another medicine that also causes drowsiness, you may have more side effects. Give your health care provider a list of all medicines you use. Your doctor will tell you how much medicine to take. Do not take more medicine than directed. Call emergency for help if you have problems breathing or unusual sleepiness. The use of this medicine may increase the chance of suicidal thoughts or actions. Pay special attention to how you are responding while on this medicine. Any worsening of mood, or thoughts of suicide or dying should be reported to your health care professional right away. What side effects may I notice from receiving this medicine? Side effects that you should report to your doctor or health care professional as soon as possible: -allergic reactions like skin rash, itching or hives, swelling of the face, lips, or tongue -breathing problems -confusion -loss of balance or coordination -signs and symptoms of  low blood pressure like dizziness; feeling faint or lightheaded, falls; unusually weak or tired -suicidal thoughts or mood changes Side effects that usually do not require medical attention (report to your doctor or health care professional if they continue or are bothersome): -dizziness -headache -tiredness -upset stomach This list may not describe all possible side effects. Call your doctor for medical advice about side effects. You may report side effects to FDA at 1-800-FDA-1088. Where should I keep my medicine? Keep out of the reach of children. This medicine can be abused. Keep your medicine in a safe place to protect it from theft. Do not share this medicine with anyone. Selling or giving away this medicine is dangerous and against the law. This medicine may cause accidental overdose and death if taken by other adults, children, or pets. Mix any unused medicine with a substance like cat litter or coffee grounds. Then throw the medicine away in a sealed container like a sealed bag or a coffee can with a lid. Do not use the medicine after the expiration date. Store at room temperature between 15 and 30 degrees C (59 and 86 degrees F). Protect from light. Keep container tightly closed. NOTE: This sheet is a summary. It may not cover all possible information. If you have questions about this medicine, talk to your doctor, pharmacist, or health care provider.  2018 Elsevier/Gold Standard (2015-08-05 18:46:32)

## 2016-06-13 ENCOUNTER — Encounter: Payer: Self-pay | Admitting: Family Medicine

## 2016-06-13 LAB — BASIC METABOLIC PANEL WITH GFR
BUN: 13 mg/dL (ref 7–25)
CALCIUM: 9.2 mg/dL (ref 8.6–10.3)
CO2: 26 mmol/L (ref 20–31)
Chloride: 104 mmol/L (ref 98–110)
Creat: 1.03 mg/dL (ref 0.60–1.35)
GFR, EST NON AFRICAN AMERICAN: 89 mL/min (ref >=60)
GLUCOSE: 89 mg/dL (ref 65–99)
POTASSIUM: 4.2 mmol/L (ref 3.5–5.3)
SODIUM: 139 mmol/L (ref 135–146)

## 2016-06-14 ENCOUNTER — Telehealth: Payer: Self-pay

## 2016-06-14 NOTE — Telephone Encounter (Signed)
All of those medicines can cause side effects.  Try continuing the medicines.  If it continues let me know.

## 2016-06-14 NOTE — Telephone Encounter (Signed)
Pt called and is questioning if he is having side effects from medication losartan or metoprolol.  Yesterday he thought it might be the Klonopin, but he didn't take it last night.  Symptoms are lightheadedness, nausea, and generalized not feeling well.  Please advise.

## 2016-06-14 NOTE — Telephone Encounter (Signed)
Notified patient.

## 2016-06-15 DIAGNOSIS — R079 Chest pain, unspecified: Secondary | ICD-10-CM | POA: Diagnosis not present

## 2016-06-15 DIAGNOSIS — I10 Essential (primary) hypertension: Secondary | ICD-10-CM | POA: Diagnosis not present

## 2016-06-15 DIAGNOSIS — R0602 Shortness of breath: Secondary | ICD-10-CM | POA: Diagnosis not present

## 2016-06-15 DIAGNOSIS — Z8679 Personal history of other diseases of the circulatory system: Secondary | ICD-10-CM | POA: Diagnosis not present

## 2016-06-15 DIAGNOSIS — R002 Palpitations: Secondary | ICD-10-CM | POA: Diagnosis not present

## 2016-06-15 DIAGNOSIS — Z8719 Personal history of other diseases of the digestive system: Secondary | ICD-10-CM | POA: Diagnosis not present

## 2016-06-15 MED ORDER — DILTIAZEM HCL ER COATED BEADS 120 MG PO CP24: 120.0000 mg | ORAL_CAPSULE | Freq: Every day | ORAL | 0 refills | Status: DC

## 2016-06-15 NOTE — Telephone Encounter (Signed)
I had a lengthy conversation with Justin Ashley.  Will switch to Diltiazem. STOP metoprolol.

## 2016-06-18 ENCOUNTER — Encounter: Payer: Self-pay | Admitting: Family Medicine

## 2016-06-18 DIAGNOSIS — R002 Palpitations: Secondary | ICD-10-CM

## 2016-06-19 ENCOUNTER — Encounter: Payer: Self-pay | Admitting: Cardiology

## 2016-06-20 ENCOUNTER — Ambulatory Visit (INDEPENDENT_AMBULATORY_CARE_PROVIDER_SITE_OTHER): Payer: BLUE CROSS/BLUE SHIELD | Admitting: Cardiology

## 2016-06-20 ENCOUNTER — Encounter: Payer: Self-pay | Admitting: Cardiology

## 2016-06-20 VITALS — BP 122/82 | HR 78 | Ht 68.0 in | Wt 174.0 lb

## 2016-06-20 DIAGNOSIS — R002 Palpitations: Secondary | ICD-10-CM

## 2016-06-20 NOTE — Patient Instructions (Addendum)
Medication Instructions:    Your physician recommends that you continue on your current medications as directed. Please refer to the Current Medication list given to you today.  --- If you need a refill on your cardiac medications before your next appointment, please call your pharmacy. ---  Labwork:  None ordered  Testing/Procedures:  None ordered  Follow-Up:  To be determined once monitor results are received/reviewed by Dr. Curt Bears.    Any Other Special Instructions Will Be Listed Below (If Applicable).    Thank you for choosing [facility]!!   Trinidad Curet, RN [phone]

## 2016-06-20 NOTE — Progress Notes (Signed)
Electrophysiology Office Note   Date:  06/20/2016   ID:  Justin Ashley, DOB 1975/11/21, MRN [id number]  PCP:  Lynne Leader, MD   Primary Electrophysiologist:  Shyloh Derosa Meredith Leeds, MD    Chief Complaint  Patient presents with  . Follow-up    Palpitations/ PVC's     History of Present Illness: Justin Ashley is a 41 y.o. male who presents today for electrophysiology evaluation.   He presented 6 months ago with episodic palpitations. Palpitations were also causing chest pain. He had an echo and a Myoview which were both without major abnormality. 48 hour monitor showed no evidence of arrhythmia and rare PVCs and PACs. His continued to have palpitations since that time. As the palpitations happen every day. They are associated with chest discomfort. He has a plan to wear a 24-hour monitor per his primary physician.   Today, he denies symptoms of shortness of breath, orthopnea, PND, lower extremity edema, claudication, dizziness, presyncope, syncope, bleeding, or neurologic sequela. The patient is tolerating medications without difficulties and is otherwise without complaint today.    Past Medical History:  Diagnosis Date  . Dislocation of metatarsophalangeal joint of right great toe   . Pneumonia age 89   No past surgical history on file. none per patient   Current Outpatient Prescriptions  Medication Sig Dispense Refill  . AMBULATORY NON FORMULARY MEDICATION BD 72mL syringe and BD 22G 1 and 1/2" needle, use to inject testosterone every two weeks.  BD 18G 1 and 1/2" needle used to draw up testosterone. Dx: Hypogonadism 20 Units 11  . AMBULATORY NON FORMULARY MEDICATION ProAir RespiClick.  Inhale two puffs every four hours only as needed for shortness of breath or wheezing. Use savings voucher. 1 Inhaler 0  . clonazePAM (KLONOPIN) 0.5 MG tablet Take 1 tablet (0.5 mg total) by mouth 2 (two) times daily as needed for anxiety. 30 tablet 0  . diltiazem (CARDIZEM CD) 120 MG 24 hr capsule Take 1  capsule (120 mg total) by mouth daily. 30 capsule 0  . fluticasone (FLONASE) 50 MCG/ACT nasal spray One spray in each nostril twice a day, use left hand for right nostril, and right hand for left nostril. 48 g 3  . losartan (COZAAR) 50 MG tablet Take 1 tablet (50 mg total) by mouth daily. 90 tablet 3  . tadalafil (CIALIS) 20 MG tablet Take 0.5-1 tablets (10-20 mg total) by mouth every other day as needed for erectile dysfunction. 5 tablet 11  . testosterone cypionate (DEPOTESTOSTERONE CYPIONATE) 200 MG/ML injection Inject 1.2 mLs (240 mg total) into the muscle every 14 (fourteen) days. 12 mL 5   No current facility-administered medications for this visit.     Allergies:   Hydrocodone-acetaminophen; Lisinopril; Oxycodone-acetaminophen; and Zoloft [sertraline hcl]   Social History:  The patient  reports that he has been smoking Cigarettes.  He has smoked for the past 22.00 years. He has never used smokeless tobacco. He reports that he does not drink alcohol or use drugs.   Family History:  The patient's family history includes Cancer in his father and mother.    ROS:  Please see the history of present illness.   Otherwise, review of systems is positive for palpitations.   All other systems are reviewed and negative.    PHYSICAL EXAM: VS:  BP 122/82   Pulse 78   Ht 5\' 8"  (1.727 m)   Wt 174 lb (78.9 kg)   BMI 26.46 kg/m  , BMI Body mass index is 26.46  kg/m. GEN: Well nourished, well developed, in no acute distress  HEENT: normal  Neck: no JVD, carotid bruits, or masses Cardiac: RRR; no murmurs, rubs, or gallops,no edema  Respiratory:  clear to auscultation bilaterally, normal work of breathing GI: soft, nontender, nondistended, + BS MS: no deformity or atrophy  Skin: warm and dry Neuro:  Strength and sensation are intact Psych: euthymic mood, full affect  EKG:  EKG is ordered today. Personal review of the ekg ordered shows sinus rhythm, rate 78, PVC  Recent Labs: 11/28/2015: TSH  1.03 04/30/2016: ALT 14 05/18/2016: Hemoglobin 16.3; Platelets 406 05/23/2016: Magnesium 2.2 06/12/2016: BUN 13; Creat 1.03; Potassium 4.2; Sodium 139    Lipid Panel     Component Value Date/Time   CHOL 209 (H) 04/30/2016 0902   TRIG 106 04/30/2016 0902   HDL 39 (L) 04/30/2016 0902   CHOLHDL 5.4 (H) 04/30/2016 0902   VLDL 21 04/30/2016 0902   LDLCALC 149 (H) 04/30/2016 0902     Wt Readings from Last 3 Encounters:  06/20/16 174 lb (78.9 kg)  06/12/16 175 lb (79.4 kg)  05/23/16 175 lb (79.4 kg)      Other studies Reviewed: Additional studies/ records that were reviewed today include: 12/08/15 SPECT  Nuclear stress EF: 55%.  There was no ST segment deviation noted during stress.  The study is normal.  This is a low risk study.   Low risk stress nuclear study with normal perfusion and normal left ventricular regional and global systolic function.  TTE 12/07/15 - Left ventricle: The cavity size was normal. Systolic function was   normal. The estimated ejection fraction was in the range of 60%   to 65%. Wall motion was normal; there were no regional wall   motion abnormalities. Left ventricular diastolic function   parameters were normal. - Left atrium: The atrium was mildly dilated. - Atrial septum: No defect or patent foramen ovale was identified.  Holter 12/05/15 Minimum HR: 31 BPM at 7:25:33 AM Maximum HR: 132 BPM at 10:14:00 AM Average HR: 77 BPM Zero atrial fibrillation Sinus rhythm with sinus tachycardia  ASSESSMENT AND PLAN:  1.  Chest pain: Myoview low risk, unlikely chest pain is cardiac in nature.  2. Palpitations: Holter monitor performed without evidence of arrhythmia and only rare PVCs. Her palpitations are continuing. Due to his low number of PVCs on his previous monitor, I am not convinced that his PVC burden is causing him to have palpitations, with only 64 PVCs and 200,000 beats. We'll await the results of his 24 hour monitor prior to initiating any  therapy.  Current medicines are reviewed at length with the patient today.   The patient does not have concerns regarding his medicines.  The following changes were made today:  none  Labs/ tests ordered today include:  Orders Placed This Encounter  Procedures  . EKG 12-Lead     Disposition:   FU with Nhi Butrum Bambi Fehnel 6 weeks  Signed, Miki Labuda Meredith Leeds, MD  06/20/2016 3:10 PM     [address] [place] [address] [phone] (office) [phone] (fax)

## 2016-06-21 ENCOUNTER — Ambulatory Visit (INDEPENDENT_AMBULATORY_CARE_PROVIDER_SITE_OTHER): Payer: BLUE CROSS/BLUE SHIELD

## 2016-06-21 DIAGNOSIS — R002 Palpitations: Secondary | ICD-10-CM

## 2016-06-21 DIAGNOSIS — I10 Essential (primary) hypertension: Secondary | ICD-10-CM | POA: Diagnosis not present

## 2016-07-10 ENCOUNTER — Ambulatory Visit: Payer: BLUE CROSS/BLUE SHIELD | Admitting: Family Medicine

## 2016-07-29 ENCOUNTER — Encounter: Payer: Self-pay | Admitting: Family Medicine

## 2016-07-30 MED ORDER — VALACYCLOVIR HCL 1 G PO TABS: 1000.0000 mg | ORAL_TABLET | Freq: Three times a day (TID) | ORAL | 0 refills | Status: DC

## 2016-08-15 ENCOUNTER — Encounter: Payer: Self-pay | Admitting: Family Medicine

## 2016-08-15 ENCOUNTER — Ambulatory Visit (INDEPENDENT_AMBULATORY_CARE_PROVIDER_SITE_OTHER): Payer: BLUE CROSS/BLUE SHIELD | Admitting: Family Medicine

## 2016-08-15 VITALS — BP 107/62 | HR 102 | Wt 183.0 lb

## 2016-08-15 DIAGNOSIS — E291 Testicular hypofunction: Secondary | ICD-10-CM | POA: Diagnosis not present

## 2016-08-15 DIAGNOSIS — B029 Zoster without complications: Secondary | ICD-10-CM

## 2016-08-15 DIAGNOSIS — I10 Essential (primary) hypertension: Secondary | ICD-10-CM

## 2016-08-15 DIAGNOSIS — R0602 Shortness of breath: Secondary | ICD-10-CM | POA: Diagnosis not present

## 2016-08-15 DIAGNOSIS — E876 Hypokalemia: Secondary | ICD-10-CM | POA: Diagnosis not present

## 2016-08-15 LAB — BASIC METABOLIC PANEL WITH GFR
BUN: 9 mg/dL (ref 7–25)
CHLORIDE: 103 mmol/L (ref 98–110)
CO2: 28 mmol/L (ref 20–31)
CREATININE: 1.11 mg/dL (ref 0.60–1.35)
Calcium: 9.5 mg/dL (ref 8.6–10.3)
GFR, Est African American: >89 mL/min (ref >=60)
GFR, Est Non African American: 81 mL/min (ref >=60)
GLUCOSE: 89 mg/dL (ref 65–99)
POTASSIUM: 4.8 mmol/L (ref 3.5–5.3)
Sodium: 141 mmol/L (ref 135–146)

## 2016-08-15 MED ORDER — FLUTICASONE PROPIONATE 50 MCG/ACT NA SUSP: NASAL | 12 refills | Status: DC

## 2016-08-15 NOTE — Progress Notes (Signed)
Kell Ferris is a 41 y.o. male who presents to [place]: [place] today for follow-up shingles and discuss hypertension.  Patient developed shingles in the interim since the last visit. This was diagnosed via a photograph. He was prescribed Valtrex empirically which helped. He continues to have a rash on his left inner arm but does note pain and tingling along the inside of the arm. He denies any weakness or numbness and feels much better.  Hypertension: Patient takes losartan daily and denies chest pain palpitations or shortness of breath currently he feels quite well. He denies any lightheadedness or dizziness.   Past Medical History:  Diagnosis Date  . Dislocation of metatarsophalangeal joint of right great toe   . Pneumonia age 40   No past surgical history on file. Social History  Substance Use Topics  . Smoking status: Current Every Day Smoker    Years: 22.00    Types: Cigarettes  . Smokeless tobacco: Never Used     Comment: e-cigs smoker x 3 years  . Alcohol use No   family history includes Cancer in his father and mother.  ROS as above:  Medications: Current Outpatient Prescriptions  Medication Sig Dispense Refill  . AMBULATORY NON FORMULARY MEDICATION BD 5mL syringe and BD 22G 1 and 1/2" needle, use to inject testosterone every two weeks.  BD 18G 1 and 1/2" needle used to draw up testosterone. Dx: Hypogonadism 20 Units 11  . AMBULATORY NON FORMULARY MEDICATION ProAir RespiClick.  Inhale two puffs every four hours only as needed for shortness of breath or wheezing. Use savings voucher. 1 Inhaler 0  . clonazePAM (KLONOPIN) 0.5 MG tablet Take 1 tablet (0.5 mg total) by mouth 2 (two) times daily as needed for anxiety. 30 tablet 0  . fluticasone (FLONASE) 50 MCG/ACT nasal spray One spray in each nostril twice a day, use left hand for right nostril, and right hand  for left nostril. 48 g 12  . losartan (COZAAR) 50 MG tablet Take 1 tablet (50 mg total) by mouth daily. 90 tablet 3  . tadalafil (CIALIS) 20 MG tablet Take 0.5-1 tablets (10-20 mg total) by mouth every other day as needed for erectile dysfunction. 5 tablet 11  . testosterone cypionate (DEPOTESTOSTERONE CYPIONATE) 200 MG/ML injection Inject 1.2 mLs (240 mg total) into the muscle every 14 (fourteen) days. 12 mL 5   No current facility-administered medications for this visit.    Allergies  Allergen Reactions  . Hydrocodone-Acetaminophen   . Lisinopril     Cough  . Oxycodone-Acetaminophen   . Zoloft [Sertraline Hcl]     Suicide attempt    Health Maintenance Health Maintenance  Topic Date Due  . INFLUENZA VACCINE  10/10/2016  . TETANUS/TDAP  05/02/2026  . HIV Screening  Completed     Exam:  BP 107/62   Pulse (!) 102   Wt 183 lb (83 kg)   BMI 27.83 kg/m  Gen: Well NAD HEENT: EOMI,  MMM Lungs: Normal work of breathing. CTABL Heart: RRR no MRG Rate 66 bpm per my check Abd: NABS, Soft. Nondistended, Nontender Exts: Brisk capillary refill, warm and well perfused.  Skin: Erythematous maculopapular rash on the left inner arm consistent with resolving shingles present.   No results found for this or any previous visit (from the past 72 hour(s)). No results found.    Assessment and Plan: 41 y.o. male with  Hypertension: A goal continue current regimen. We'll check metabolic  panel today to confirm normal potassium values. We'll also arrange for fasting labs to be done in the next few months to follow-up his low testosterone.  Shingles: Resolving. Continue to follow along. Watchful waiting.  Flonase refilled for seasonal allergies.   Orders Placed This Encounter  Procedures  . BASIC METABOLIC PANEL WITH GFR  . CBC  . COMPLETE METABOLIC PANEL WITH GFR  . Lipid Panel w/reflex Direct LDL  . PSA  . Testosterone   Meds ordered this encounter  Medications  . fluticasone  (FLONASE) 50 MCG/ACT nasal spray    Sig: One spray in each nostril twice a day, use left hand for right nostril, and right hand for left nostril.    Dispense:  48 g    Refill:  12     Discussed warning signs or symptoms. Please see discharge instructions. Patient expresses understanding.

## 2016-08-15 NOTE — Patient Instructions (Signed)
Thank you for coming in today. Get labs now.  Check fasting labs in about 2 months for testosterone.  Check prior to 10 am.  Return around December or January or sooner if needed.   Otherwise keep the shingles rash covered in ointment until it heals.

## 2016-08-29 ENCOUNTER — Encounter: Payer: Self-pay | Admitting: Cardiology

## 2016-08-29 ENCOUNTER — Ambulatory Visit (INDEPENDENT_AMBULATORY_CARE_PROVIDER_SITE_OTHER): Payer: BLUE CROSS/BLUE SHIELD | Admitting: Cardiology

## 2016-08-29 VITALS — BP 131/87 | HR 90 | Ht 68.5 in | Wt 180.8 lb

## 2016-08-29 DIAGNOSIS — R002 Palpitations: Secondary | ICD-10-CM | POA: Diagnosis not present

## 2016-08-29 MED ORDER — BISOPROLOL FUMARATE 5 MG PO TABS: 5.0000 mg | ORAL_TABLET | Freq: Every day | ORAL | 1 refills | Status: DC

## 2016-08-29 NOTE — Patient Instructions (Signed)
Medication Instructions:    Your physician has recommended you make the following change in your medication:  1) START Bisoprolol 5 mg daily  - If you need a refill on your cardiac medications before your next appointment, please call your pharmacy.   Labwork:  None ordered  Testing/Procedures:  None ordered  Follow-Up:  Your physician wants you to follow-up in: 6 months with Dr. Curt Bears.  You will receive a reminder letter in the mail two months in advance. If you don't receive a letter, please call our office to schedule the follow-up appointment.  Thank you for choosing [facility]!!   Trinidad Curet, RN [phone]  Any Other Special Instructions Will Be Listed Below (If Applicable).  Bisoprolol tablets What is this medicine? BISOPROLOL (bis [place] proe lol) is a beta-blocker. Beta-blockers reduce the workload on the heart and help it to beat more regularly. This medicine is used to treat high blood pressure. This medicine may be used for other purposes; ask your health care provider or pharmacist if you have questions. COMMON BRAND NAME(S): Zebeta What should I tell my health care provider before I take this medicine? They need to know if you have any of these conditions: -chest pain (angina) -diabetes -heart or vessel disease like slow heart rate, worsening heart failure, heart block, sick sinus syndrome or Raynaud's disease -kidney disease -liver disease -lung or breathing disease, like asthma or emphysema -pheochromocytoma -thyroid disease -an unusual or allergic reaction to bisoprolol, other beta-blockers, medicines, foods, dyes, or preservatives -pregnant or trying to get pregnant -breast-feeding How should I use this medicine? Take this medicine by mouth with a glass of water. Follow the directions on the prescription label. You can take this medicine with or without food. Take your doses at regular intervals. Do not take your medicine more often than  directed. Do not stop taking this medicine suddenly. This could lead to serious heart-related effects. Talk to your pediatrician regarding the use of this medicine in children. Special care may be needed. Overdosage: If you think you have taken too much of this medicine contact a poison control center or emergency room at once. NOTE: This medicine is only for you. Do not share this medicine with others. What if I miss a dose? If you miss a dose, take it as soon as you can. If it is almost time for your next dose, take only that dose. Do not take double or extra doses. What may interact with this medicine? This medicine may interact with the following medications: -certain medicines for blood pressure, heart disease, irregular heart beat -NSAIDs, medicines for pain and inflammation, like ibuprofen or naproxen -rifampin This list may not describe all possible interactions. Give your health care provider a list of all the medicines, herbs, non-prescription drugs, or dietary supplements you use. Also tell them if you smoke, drink alcohol, or use illegal drugs. Some items may interact with your medicine. What should I watch for while using this medicine? Visit your doctor or health care professional for regular checks on your progress. Check your heart rate and blood pressure regularly while you are taking this medicine. Ask your doctor or health care professional what your heart rate and blood pressure should be, and when you should contact him or her. You may get drowsy or dizzy. Do not drive, use machinery, or do anything that needs mental alertness until you know how this drug affects you. Do not stand or sit up quickly, especially if you are an older  patient. This reduces the risk of dizzy or fainting spells. Alcohol can make you more drowsy and dizzy. Avoid alcoholic drinks. This medicine can affect blood sugar levels. If you have diabetes, check with your doctor or health care professional before  you change your diet or the dose of your diabetic medicine. Do not treat yourself for coughs, colds, or pain while you are taking this medicine without asking your doctor or health care professional for advice. Some ingredients may increase your blood pressure. What side effects may I notice from receiving this medicine? Side effects that you should report to your doctor or health care professional as soon as possible: -allergic reactions like skin rash, itching or hives, swelling of the face, lips, or tongue -breathing problems -chest pain -cold, tingling, or numb hands or feet -confusion -irregular, slow heartbeat -muscle aches and pains -sweating -swollen legs or ankles -tremors -vomiting Side effects that usually do not require medical attention (report to your doctor or health care professional if they continue or are bothersome): -anxiety -change in sex drive or performance -depression -diarrhea -dry or burning eyes -headache -nausea This list may not describe all possible side effects. Call your doctor for medical advice about side effects. You may report side effects to FDA at 1-800-FDA-1088. Where should I keep my medicine? Keep out of the reach of children. Store at room temperature between 20 and 25 degrees C (68 and 77 degrees F). Protect from moisture. Throw away any unused medicine after the expiration date. NOTE: This sheet is a summary. It may not cover all possible information. If you have questions about this medicine, talk to your doctor, pharmacist, or health care provider.  2018 Elsevier/Gold Standard (2012-10-31 14:30:04)

## 2016-08-29 NOTE — Telephone Encounter (Signed)
Closed

## 2016-08-29 NOTE — Progress Notes (Signed)
Electrophysiology Office Note   Date:  08/29/2016   ID:  Justin Ashley, DOB 1975/06/24, MRN [id number]  PCP:  Justin Hams, MD   Primary Electrophysiologist:  Justin Haw, MD    Chief Complaint  Patient presents with  . Follow-up    Palpitations     History of Present Illness: Justin Ashley is a 41 y.o. male who presents today for electrophysiology evaluation.   He presented 6 months ago with episodic palpitations. Palpitations were also causing chest pain. He had an echo and a Myoview which were both without major abnormality. 48 hour monitor showed no evidence of arrhythmia and rare PVCs and PACs. Repeat Holter monitor showed 3% PVCs. He had symptoms when he was in normal rhythm, sinus tachycardia, as well as when he had PVCs. He also had no symptoms during episodes of PVCs. He says that his episodes of palpitations have no pattern. There is no exacerbating or alleviating factor. Sometimes he has palpitations at night when he tries to sleep. He had to take Klonopin the fall asleep when this occurs.   Today, denies symptoms of chest pain, shortness of breath, orthopnea, PND, lower extremity edema, claudication, dizziness, presyncope, syncope, bleeding, or neurologic sequela. The patient is tolerating medications without difficulties and is otherwise without complaint today.     Past Medical History:  Diagnosis Date  . Dislocation of metatarsophalangeal joint of right great toe   . Pneumonia age 67   No past surgical history on file. none per patient   Current Outpatient Prescriptions  Medication Sig Dispense Refill  . AMBULATORY NON FORMULARY MEDICATION BD 71mL syringe and BD 22G 1 and 1/2" needle, use to inject testosterone every two weeks.  BD 18G 1 and 1/2" needle used to draw up testosterone. Dx: Hypogonadism 20 Units 11  . AMBULATORY NON FORMULARY MEDICATION ProAir RespiClick.  Inhale two puffs every four hours only as needed for shortness of breath or wheezing. Use  savings voucher. 1 Inhaler 0  . clonazePAM (KLONOPIN) 0.5 MG tablet Take 1 tablet (0.5 mg total) by mouth 2 (two) times daily as needed for anxiety. 30 tablet 0  . fluticasone (FLONASE) 50 MCG/ACT nasal spray One spray in each nostril twice a day, use left hand for right nostril, and right hand for left nostril. 48 g 12  . losartan (COZAAR) 50 MG tablet Take 1 tablet (50 mg total) by mouth daily. 90 tablet 3  . tadalafil (CIALIS) 20 MG tablet Take 0.5-1 tablets (10-20 mg total) by mouth every other day as needed for erectile dysfunction. 5 tablet 11  . testosterone cypionate (DEPOTESTOSTERONE CYPIONATE) 200 MG/ML injection Inject 1.2 mLs (240 mg total) into the muscle every 14 (fourteen) days. 12 mL 5   No current facility-administered medications for this visit.     Allergies:   Hydrocodone-acetaminophen; Lisinopril; Oxycodone-acetaminophen; and Zoloft [sertraline hcl]   Social History:  The patient  reports that he has been smoking Cigarettes.  He has smoked for the past 22.00 years. He has never used smokeless tobacco. He reports that he does not drink alcohol or use drugs.   Family History:  The patient's family history includes Cancer in his father and mother.    ROS:  Please see the history of present illness.   Otherwise, review of systems is positive for palpitations.   All other systems are reviewed and negative.     PHYSICAL EXAM: VS:  BP 131/87   Pulse 90   Ht 5' 8.5" (1.74 m)  Wt 180 lb 12.8 oz (82 kg)   BMI 27.09 kg/m  , BMI Body mass index is 27.09 kg/m. GEN: Well nourished, well developed, in no acute distress  HEENT: normal  Neck: no JVD, carotid bruits, or masses Cardiac: RRR; no murmurs, rubs, or gallops,no edema  Respiratory:  clear to auscultation bilaterally, normal work of breathing GI: soft, nontender, nondistended, + BS MS: no deformity or atrophy  Skin: warm and dry Neuro:  Strength and sensation are intact Psych: euthymic mood, full affect  EKG:  EKG  is not ordered today. Personal review of the ekg ordered 01/13/17 shows sinus rhythm, isolated PVC  Recent Labs: 11/28/2015: TSH 1.03 04/30/2016: ALT 14 05/18/2016: Hemoglobin 16.3; Platelets 406 05/23/2016: Magnesium 2.2 08/15/2016: BUN 9; Creat 1.11; Potassium 4.8; Sodium 141    Lipid Panel     Component Value Date/Time   CHOL 209 (H) 04/30/2016 0902   TRIG 106 04/30/2016 0902   HDL 39 (L) 04/30/2016 0902   CHOLHDL 5.4 (H) 04/30/2016 0902   VLDL 21 04/30/2016 0902   LDLCALC 149 (H) 04/30/2016 0902     Wt Readings from Last 3 Encounters:  08/29/16 180 lb 12.8 oz (82 kg)  08/15/16 183 lb (83 kg)  06/20/16 174 lb (78.9 kg)      Other studies Reviewed: Additional studies/ records that were reviewed today include: 12/08/15 SPECT  Nuclear stress EF: 55%.  There was no ST segment deviation noted during stress.  The study is normal.  This is a low risk study.   Low risk stress nuclear study with normal perfusion and normal left ventricular regional and global systolic function.  TTE 12/07/15 - Left ventricle: The cavity size was normal. Systolic function was   normal. The estimated ejection fraction was in the range of 60%   to 65%. Wall motion was normal; there were no regional wall   motion abnormalities. Left ventricular diastolic function   parameters were normal. - Left atrium: The atrium was mildly dilated. - Atrial septum: No defect or patent foramen ovale was identified.  Holter 12/05/15 Minimum HR: 31 BPM at 7:25:33 AM Maximum HR: 132 BPM at 10:14:00 AM Average HR: 77 BPM Zero atrial fibrillation Sinus rhythm with sinus tachycardia  ASSESSMENT AND PLAN:  1.  Palpitations: Repeat monitor showed 3% PVCs. It is unclear as to if the PVCs are causing him to have symptoms. He had symptoms during both PVCs, and sinus rhythm. Due to his constellation of symptoms, Justin Ashley try him on 5 mg of bisoprolol to see if this Justin Ashley help. This medication can be titrated up as necessary.  He does say that he had eye pain when he had metoprolol, and thus stops taking it.  Current medicines are reviewed at length with the patient today.   The patient does not have concerns regarding his medicines.  The following changes were made today:  bisoprolol  Labs/ tests ordered today include:  No orders of the defined types were placed in this encounter.    Disposition:   FU with Glynna Failla 6 months  Signed, Eliani Leclere Meredith Leeds, MD  08/29/2016 2:33 PM     [place] [address] [place] [address] [phone] (office) [phone] (fax)

## 2016-10-29 DIAGNOSIS — I10 Essential (primary) hypertension: Secondary | ICD-10-CM | POA: Diagnosis not present

## 2016-10-29 DIAGNOSIS — E291 Testicular hypofunction: Secondary | ICD-10-CM | POA: Diagnosis not present

## 2016-10-29 LAB — CBC
HCT: 48.8 % (ref 38.5–50.0)
HEMOGLOBIN: 16.2 g/dL (ref 13.2–17.1)
MCH: 30.6 pg (ref 27.0–33.0)
MCHC: 33.2 g/dL (ref 32.0–36.0)
MCV: 92.1 fL (ref 80.0–100.0)
MPV: 9 fL (ref 7.5–12.5)
Platelets: 455 10*3/uL — ABNORMAL HIGH (ref 140–400)
RBC: 5.3 MIL/uL (ref 4.20–5.80)
RDW: 14.3 % (ref 11.0–15.0)
WBC: 5 10*3/uL (ref 3.8–10.8)

## 2016-10-30 ENCOUNTER — Other Ambulatory Visit: Payer: Self-pay | Admitting: Family Medicine

## 2016-10-30 DIAGNOSIS — E291 Testicular hypofunction: Secondary | ICD-10-CM

## 2016-10-30 LAB — COMPLETE METABOLIC PANEL WITH GFR
ALT: 20 U/L (ref 9–46)
AST: 13 U/L (ref 10–40)
Albumin: 4.4 g/dL (ref 3.6–5.1)
Alkaline Phosphatase: 52 U/L (ref 40–115)
BUN: 8 mg/dL (ref 7–25)
CO2: 23 mmol/L (ref 20–32)
Calcium: 9.5 mg/dL (ref 8.6–10.3)
Chloride: 105 mmol/L (ref 98–110)
Creat: 1.02 mg/dL (ref 0.60–1.35)
GFR, Est African American: >89 mL/min (ref >=60)
GFR, Est Non African American: >89 mL/min (ref >=60)
GLUCOSE: 101 mg/dL — AB (ref 65–99)
POTASSIUM: 4.3 mmol/L (ref 3.5–5.3)
SODIUM: 142 mmol/L (ref 135–146)
Total Bilirubin: 0.6 mg/dL (ref 0.2–1.2)
Total Protein: 6.9 g/dL (ref 6.1–8.1)

## 2016-10-30 LAB — PSA: PSA: 1.4 ng/mL (ref <=4.0)

## 2016-10-30 LAB — LIPID PANEL W/REFLEX DIRECT LDL
CHOL/HDL RATIO: 5.5 ratio — AB (ref <5.0)
Cholesterol: 219 mg/dL — ABNORMAL HIGH (ref <200)
HDL: 40 mg/dL — ABNORMAL LOW (ref >40)
LDL-CHOLESTEROL: 149 mg/dL — AB
Non-HDL Cholesterol (Calc): 179 mg/dL — ABNORMAL HIGH (ref <130)
Triglycerides: 161 mg/dL — ABNORMAL HIGH (ref <150)

## 2016-10-30 LAB — TESTOSTERONE: TESTOSTERONE: 564 ng/dL (ref 250–827)

## 2016-10-30 MED ORDER — TESTOSTERONE CYPIONATE 200 MG/ML IM SOLN: 240.0000 mg | INTRAMUSCULAR | 5 refills | Status: DC

## 2016-10-31 ENCOUNTER — Ambulatory Visit: Payer: BLUE CROSS/BLUE SHIELD | Admitting: Family Medicine

## 2017-02-05 ENCOUNTER — Encounter: Payer: Self-pay | Admitting: Family Medicine

## 2017-02-06 ENCOUNTER — Ambulatory Visit (INDEPENDENT_AMBULATORY_CARE_PROVIDER_SITE_OTHER): Payer: BLUE CROSS/BLUE SHIELD | Admitting: Family Medicine

## 2017-02-06 ENCOUNTER — Encounter: Payer: Self-pay | Admitting: Family Medicine

## 2017-02-06 VITALS — BP 113/58 | HR 71 | Wt 181.0 lb

## 2017-02-06 DIAGNOSIS — R42 Dizziness and giddiness: Secondary | ICD-10-CM | POA: Diagnosis not present

## 2017-02-06 DIAGNOSIS — Z23 Encounter for immunization: Secondary | ICD-10-CM | POA: Diagnosis not present

## 2017-02-06 NOTE — Patient Instructions (Addendum)
Thank you for coming in today. Try stopping the Bisoprolol. If palpitations return resume it and let me know.   If we want to switch to a different Beta Blocker we can use Atenolol as the next step.  If you worsen let me know.  We can reschedule in 3 month.    Orthostatic Hypotension Orthostatic hypotension is a sudden drop in blood pressure that happens when you quickly change positions, such as when you get up from a seated or lying position. Blood pressure is a measurement of how strongly, or weakly, your blood is pressing against the walls of your arteries. Arteries are blood vessels that carry blood from your heart throughout your body. When blood pressure is too low, you may not get enough blood to your brain or to the rest of your organs. This can cause weakness, light-headedness, rapid heartbeat, and fainting. This can last for just a few seconds or for up to a few minutes. Orthostatic hypotension is usually not a serious problem. However, if it happens frequently or gets worse, it may be a sign of something more serious. What are the causes? This condition may be caused by:  Sudden changes in posture, such as standing up quickly after you have been sitting or lying down.  Blood loss.  Loss of body fluids (dehydration).  Heart problems.  Hormone (endocrine) problems.  Pregnancy.  Severe infection.  Lack of certain nutrients.  Severe allergic reactions (anaphylaxis).  Certain medicines, such as blood pressure medicine or medicines that make the body lose excess fluids (diuretics). Sometimes, this condition can be caused by not taking medicine as directed, such as taking too much of a certain medicine.  What increases the risk? Certain factors can make you more likely to develop orthostatic hypotension, including:  Age. Risk increases as you get older.  Conditions that affect the heart or the central nervous system.  Taking certain medicines, such as blood pressure  medicine or diuretics.  Being pregnant.  What are the signs or symptoms? Symptoms of this condition may include:  Weakness.  Light-headedness.  Dizziness.  Blurred vision.  Fatigue.  Rapid heartbeat.  Fainting, in severe cases.  How is this diagnosed? This condition is diagnosed based on:  Your medical history.  Your symptoms.  Your blood pressure measurement. Your health care provider will check your blood pressure when you are: ? Lying down. ? Sitting. ? Standing.  A blood pressure reading is recorded as two numbers, such as "120 over 80" (or 120/80). The first ("top") number is called the systolic pressure. It is a measure of the pressure in your arteries as your heart beats. The second ("bottom") number is called the diastolic pressure. It is a measure of the pressure in your arteries when your heart relaxes between beats. Blood pressure is measured in a unit called mm Hg. Healthy blood pressure for adults is 120/80. If your blood pressure is below 90/60, you may be diagnosed with hypotension. Other information or tests that may be used to diagnose orthostatic hypotension include:  Your other vital signs, such as your heart rate and temperature.  Blood tests.  Tilt table test. For this test, you will be safely secured to a table that moves you from a lying position to an upright position. Your heart rhythm and blood pressure will be monitored during the test.  How is this treated? Treatment for this condition may include:  Changing your diet. This may involve eating more salt (sodium) or drinking more water.  Taking medicines to raise your blood pressure.  Changing the dosage of certain medicines you are taking that might be lowering your blood pressure.  Wearing compression stockings. These stockings help to prevent blood clots and reduce swelling in your legs.  In some cases, you may need to go to the hospital for:  Fluid replacement. This means you will  receive fluids through an IV tube.  Blood replacement. This means you will receive donated blood through an IV tube (transfusion).  Treating an infection or heart problems, if this applies.  Monitoring. You may need to be monitored while medicines that you are taking wear off.  Follow these instructions at home: Eating and drinking   Drink enough fluid to keep your urine clear or pale yellow.  Eat a healthy diet and follow instructions from your health care provider about eating or drinking restrictions. A healthy diet includes: ? Fresh fruits and vegetables. ? Whole grains. ? Lean meats. ? Low-fat dairy products.  Eat extra salt only as directed. Do not add extra salt to your diet unless your health care provider told you to do that.  Eat frequent, small meals.  Avoid standing up suddenly after eating. Medicines  Take over-the-counter and prescription medicines only as told by your health care provider. ? Follow instructions from your health care provider about changing the dosage of your current medicines, if this applies. ? Do not stop or adjust any of your medicines on your own. General instructions  Wear compression stockings as told by your health care provider.  Get up slowly from lying down or sitting positions. This gives your blood pressure a chance to adjust.  Avoid hot showers and excessive heat as directed by your health care provider.  Return to your normal activities as told by your health care provider. Ask your health care provider what activities are safe for you.  Do not use any products that contain nicotine or tobacco, such as cigarettes and e-cigarettes. If you need help quitting, ask your health care provider.  Keep all follow-up visits as told by your health care provider. This is important. Contact a health care provider if:  You vomit.  You have diarrhea.  You have a fever for more than 2-3 days.  You feel more thirsty than usual.  You  feel weak and tired. Get help right away if:  You have chest pain.  You have a fast or irregular heartbeat.  You develop numbness in any part of your body.  You cannot move your arms or your legs.  You have trouble speaking.  You become sweaty or feel lightheaded.  You faint.  You feel short of breath.  You have trouble staying awake.  You feel confused. This information is not intended to replace advice given to you by your health care provider. Make sure you discuss any questions you have with your health care provider. Document Released: 02/16/2002 Document Revised: 11/15/2015 Document Reviewed: 08/19/2015 Elsevier Interactive Patient Education  2018 [employer].

## 2017-02-06 NOTE — Progress Notes (Signed)
Justin Ashley is a 41 y.o. male who presents to [facility] today for dizzy.  [name] notes  dizziness occurring for a few days.  He describes dizziness as a room spinning sensation or sensation.  He denies any palpitations or shortness of breath.  He notes this tends to occur when he stands up.  He denies any significant dizziness or room spinning when he turns his head.  He suspects that the beta-blocker he is taking for palpitations is reducing his blood pressure causing lightheadedness.   Past Medical History:  Diagnosis Date  . Dislocation of metatarsophalangeal joint of right great toe   . Pneumonia age 31   No past surgical history on file. Social History   Tobacco Use  . Smoking status: Current Every Day Smoker    Years: 22.00    Types: Cigarettes  . Smokeless tobacco: Never Used  . Tobacco comment: e-cigs smoker x 3 years  Substance Use Topics  . Alcohol use: No   family history includes Cancer in his father and mother.  ROS as above:  Medications: Current Outpatient Medications  Medication Sig Dispense Refill  . AMBULATORY NON FORMULARY MEDICATION BD 28mL syringe and BD 22G 1 and 1/2" needle, use to inject testosterone every two weeks.  BD 18G 1 and 1/2" needle used to draw up testosterone. Dx: Hypogonadism 20 Units 11  . AMBULATORY NON FORMULARY MEDICATION ProAir RespiClick.  Inhale two puffs every four hours only as needed for shortness of breath or wheezing. Use savings voucher. 1 Inhaler 0  . bisoprolol (ZEBETA) 5 MG tablet Take 1 tablet (5 mg total) by mouth daily. 90 tablet 1  . clonazePAM (KLONOPIN) 0.5 MG tablet Take 1 tablet (0.5 mg total) by mouth 2 (two) times daily as needed for anxiety. 30 tablet 0  . fluticasone (FLONASE) 50 MCG/ACT nasal spray One spray in each nostril twice a day, use left hand for right nostril, and right hand for left  nostril. 48 g 12  . losartan (COZAAR) 50 MG tablet Take 1 tablet (50 mg total) by mouth daily. 90 tablet 3  . tadalafil (CIALIS) 20 MG tablet Take 0.5-1 tablets (10-20 mg total) by mouth every other day as needed for erectile dysfunction. 5 tablet 11  . testosterone cypionate (DEPOTESTOSTERONE CYPIONATE) 200 MG/ML injection Inject 1.2 mLs (240 mg total) into the muscle every 14 (fourteen) days. 12 mL 5   No current facility-administered medications for this visit.    Allergies  Allergen Reactions  . Hydrocodone-Acetaminophen   . Lisinopril     Cough  . Oxycodone-Acetaminophen   . Zoloft [Sertraline Hcl]     Suicide attempt    Health Maintenance Health Maintenance  Topic Date Due  . TETANUS/TDAP  05/02/2026  . INFLUENZA VACCINE  Completed  . HIV Screening  Completed     Exam:  BP (!) 113/58   Pulse 71   Wt 181 lb (82.1 kg)   BMI 27.12 kg/m   Orthostatic VS for the past 24 hrs:  BP- Lying Pulse- Lying BP- Sitting Pulse- Sitting  02/06/17 1447 121/71 75 117/79 74     Gen: Well NAD HEENT: EOMI,  MMM Lungs: Normal work of breathing. CTABL Heart: RRR no MRG Abd: NABS, Soft. Nondistended, Nontender Exts: Brisk capillary refill, warm and well perfused.  Neuro alert and oriented normal coordination balance and gait.  Negative Dix-Hallpike test bilaterally.  Twelve-lead EKG shows normal sinus rhythm at 75  bpm with 1 PAC.  No ST segment elevation or depression.  Normal QRS duration and QTC.  Abnormal EKG with the exception of PAC.   No results found for this or any previous visit (from the past 72 hour(s)). No results found.    Assessment and Plan: 41 y.o. male with  Dizziness.  Unclear etiology possible orthostatic hypotension at times.  We had a lengthy discussion.  Plan for trial off of beta-blocker.  If he does not tolerate this well and his palpitations resume would use low-dose atenolol.  Recheck in 3 months.  Flu vaccine given today prior to  discharge.   Orders Placed This Encounter  Procedures  . Flu Vaccine QUAD 36+ mos IM    cunningham  . EKG 12-Lead   No orders of the defined types were placed in this encounter.    Discussed warning signs or symptoms. Please see discharge instructions. Patient expresses understanding.

## 2017-02-13 ENCOUNTER — Ambulatory Visit (INDEPENDENT_AMBULATORY_CARE_PROVIDER_SITE_OTHER): Payer: BLUE CROSS/BLUE SHIELD | Admitting: Family Medicine

## 2017-02-13 ENCOUNTER — Encounter: Payer: Self-pay | Admitting: Family Medicine

## 2017-02-13 VITALS — BP 116/73 | HR 76 | Ht 68.0 in | Wt 183.0 lb

## 2017-02-13 DIAGNOSIS — Z0000 Encounter for general adult medical examination without abnormal findings: Secondary | ICD-10-CM

## 2017-02-13 DIAGNOSIS — E291 Testicular hypofunction: Secondary | ICD-10-CM

## 2017-02-13 MED ORDER — TESTOSTERONE CYPIONATE 200 MG/ML IM SOLN: 240.0000 mg | INTRAMUSCULAR | 5 refills | Status: DC

## 2017-02-13 NOTE — Progress Notes (Signed)
Minoru Chap is a 41 y.o. male who presents to [facility]: [facility] today for well adult visit.  [name] is doing well. He exercises regularly and tries to.  He would like to avoid medications if possible.  He uses an IT trainer cigarette.  He feels well with no significant anxiety or symptoms.  He denies any chest pain palpitations or shortness of breath.   Past Medical History:  Diagnosis Date  . Dislocation of metatarsophalangeal joint of right great toe   . Pneumonia age 1   History reviewed. No pertinent surgical history. Social History   Tobacco Use  . Smoking status: Former Smoker    Years: 22.00    Types: Cigarettes  . Smokeless tobacco: Never Used  . Tobacco comment: e-cigs smoker x 3 years  Substance Use Topics  . Alcohol use: No   family history includes Cancer in his father and mother.  ROS as above:  Medications: Current Outpatient Medications  Medication Sig Dispense Refill  . AMBULATORY NON FORMULARY MEDICATION BD 18mL syringe and BD 22G 1 and 1/2" needle, use to inject testosterone every two weeks.  BD 18G 1 and 1/2" needle used to draw up testosterone. Dx: Hypogonadism 20 Units 11  . AMBULATORY NON FORMULARY MEDICATION ProAir RespiClick.  Inhale two puffs every four hours only as needed for shortness of breath or wheezing. Use savings voucher. 1 Inhaler 0  . bisoprolol (ZEBETA) 5 MG tablet Take 1 tablet (5 mg total) by mouth daily. (Patient taking differently: Take 5 mg by mouth daily as needed. ) 90 tablet 1  . clonazePAM (KLONOPIN) 0.5 MG tablet Take 1 tablet (0.5 mg total) by mouth 2 (two) times daily as needed for anxiety. 30 tablet 0  . fluticasone (FLONASE) 50 MCG/ACT nasal spray One spray in each nostril twice a day, use left hand for right nostril, and right hand for left nostril. 48 g 12  . losartan (COZAAR) 50 MG tablet Take 1 tablet (50 mg  total) by mouth daily. 90 tablet 3  . tadalafil (CIALIS) 20 MG tablet Take 0.5-1 tablets (10-20 mg total) by mouth every other day as needed for erectile dysfunction. 5 tablet 11  . testosterone cypionate (DEPOTESTOSTERONE CYPIONATE) 200 MG/ML injection Inject 1.2 mLs (240 mg total) into the muscle every 14 (fourteen) days. 12 mL 5   No current facility-administered medications for this visit.    Allergies  Allergen Reactions  . Hydrocodone-Acetaminophen   . Lisinopril     Cough  . Oxycodone-Acetaminophen   . Zoloft [Sertraline Hcl]     Suicide attempt    Health Maintenance Health Maintenance  Topic Date Due  . TETANUS/TDAP  05/02/2026  . INFLUENZA VACCINE  Completed  . HIV Screening  Completed     Exam:  BP 116/73   Pulse 76   Ht 5\' 8"  (1.727 m)   Wt 183 lb (83 kg)   BMI 27.83 kg/m  Gen: Well NAD HEENT: EOMI,  MMM Lungs: Normal work of breathing. CTABL Heart: RRR no MRG Abd: NABS, Soft. Nondistended, Nontender Exts: Brisk capillary refill, warm and well perfused.  Skin: No concerning skin lesions  Lab Results  Component Value Date   CHOL 219 (H) 10/29/2016   HDL 40 (L) 10/29/2016   LDLCALC 149 (H) 04/30/2016   TRIG 161 (H) 10/29/2016   CHOLHDL 5.5 (H) 10/29/2016     Chemistry      Component Value Date/Time   NA 142  10/29/2016 1006   K 4.3 10/29/2016 1006   CL 105 10/29/2016 1006   CO2 23 10/29/2016 1006   BUN 8 10/29/2016 1006   CREATININE 1.02 10/29/2016 1006      Component Value Date/Time   CALCIUM 9.5 10/29/2016 1006   ALKPHOS 52 10/29/2016 1006   AST 13 10/29/2016 1006   ALT 20 10/29/2016 1006   BILITOT 0.6 10/29/2016 1006       No results found for this or any previous visit (from the past 72 hour(s)). No results found.    Assessment and Plan: 41 y.o. male with  Well adult.  Doing quite well. Lipids are elevated. We discussed statins. He would like to try lifestyle changes and Omega 3 fatty acids.   Plan to continue to current  treatment and follow up in 9 months. Will get fasting testosterone labs Q6 months. He is due in March for labs.    No orders of the defined types were placed in this encounter.  Meds ordered this encounter  Medications  . testosterone cypionate (DEPOTESTOSTERONE CYPIONATE) 200 MG/ML injection    Sig: Inject 1.2 mLs (240 mg total) into the muscle every 14 (fourteen) days.    Dispense:  12 mL    Refill:  5     Discussed warning signs or symptoms. Please see discharge instructions. Patient expresses understanding.

## 2017-02-13 NOTE — Patient Instructions (Addendum)
Thank you for coming in today. We will want to recheck testosterone, CBC, CMP, and PSA in 3 months.  You do not need to see me.  We can order the labs closer to when they are do.  Send me a reminder.   Restart the beta blocker as needed.   Recheck with me in 6-9 months.

## 2017-02-14 ENCOUNTER — Encounter: Payer: Self-pay | Admitting: Family Medicine

## 2017-02-26 ENCOUNTER — Encounter: Payer: Self-pay | Admitting: Family Medicine

## 2017-05-12 ENCOUNTER — Encounter: Payer: Self-pay | Admitting: Family Medicine

## 2017-05-12 DIAGNOSIS — E291 Testicular hypofunction: Secondary | ICD-10-CM

## 2017-05-12 DIAGNOSIS — E7800 Pure hypercholesterolemia, unspecified: Secondary | ICD-10-CM

## 2017-05-12 DIAGNOSIS — I10 Essential (primary) hypertension: Secondary | ICD-10-CM

## 2017-05-20 DIAGNOSIS — E291 Testicular hypofunction: Secondary | ICD-10-CM | POA: Diagnosis not present

## 2017-05-20 DIAGNOSIS — I10 Essential (primary) hypertension: Secondary | ICD-10-CM | POA: Diagnosis not present

## 2017-05-20 DIAGNOSIS — E7800 Pure hypercholesterolemia, unspecified: Secondary | ICD-10-CM | POA: Diagnosis not present

## 2017-05-21 LAB — COMPLETE METABOLIC PANEL WITH GFR
AG RATIO: 1.6 (calc) (ref 1.0–2.5)
ALT: 31 U/L (ref 9–46)
AST: 19 U/L (ref 10–40)
Albumin: 4.7 g/dL (ref 3.6–5.1)
Alkaline phosphatase (APISO): 49 U/L (ref 40–115)
BUN: 13 mg/dL (ref 7–25)
CALCIUM: 9.8 mg/dL (ref 8.6–10.3)
CO2: 25 mmol/L (ref 20–32)
Chloride: 103 mmol/L (ref 98–110)
Creat: 1.04 mg/dL (ref 0.60–1.35)
GFR, EST AFRICAN AMERICAN: 101 mL/min/{1.73_m2} (ref >=60)
GFR, EST NON AFRICAN AMERICAN: 88 mL/min/{1.73_m2} (ref >=60)
GLUCOSE: 102 mg/dL — AB (ref 65–99)
Globulin: 2.9 g/dL (calc) (ref 1.9–3.7)
POTASSIUM: 4.3 mmol/L (ref 3.5–5.3)
Sodium: 137 mmol/L (ref 135–146)
TOTAL PROTEIN: 7.6 g/dL (ref 6.1–8.1)
Total Bilirubin: 0.7 mg/dL (ref 0.2–1.2)

## 2017-05-21 LAB — LIPID PANEL
CHOL/HDL RATIO: 5.5 (calc) — AB (ref <5.0)
CHOLESTEROL: 243 mg/dL — AB (ref <200)
HDL: 44 mg/dL (ref >40)
LDL CHOLESTEROL (CALC): 174 mg/dL — AB
Non-HDL Cholesterol (Calc): 199 mg/dL (calc) — ABNORMAL HIGH (ref <130)
Triglycerides: 122 mg/dL (ref <150)

## 2017-05-21 LAB — CBC
HCT: 46.7 % (ref 38.5–50.0)
Hemoglobin: 15.6 g/dL (ref 13.2–17.1)
MCH: 28.5 pg (ref 27.0–33.0)
MCHC: 33.4 g/dL (ref 32.0–36.0)
MCV: 85.2 fL (ref 80.0–100.0)
MPV: 9 fL (ref 7.5–12.5)
PLATELETS: 457 10*3/uL — AB (ref 140–400)
RBC: 5.48 10*6/uL (ref 4.20–5.80)
RDW: 12.1 % (ref 11.0–15.0)
WBC: 4.3 10*3/uL (ref 3.8–10.8)

## 2017-05-21 LAB — TESTOSTERONE: TESTOSTERONE: 513 ng/dL (ref 250–827)

## 2017-05-22 ENCOUNTER — Encounter: Payer: Self-pay | Admitting: Family Medicine

## 2017-05-23 DIAGNOSIS — G43809 Other migraine, not intractable, without status migrainosus: Secondary | ICD-10-CM | POA: Diagnosis not present

## 2017-06-16 ENCOUNTER — Encounter: Payer: Self-pay | Admitting: Family Medicine

## 2017-06-16 DIAGNOSIS — E7801 Familial hypercholesterolemia: Secondary | ICD-10-CM

## 2017-06-19 DIAGNOSIS — H524 Presbyopia: Secondary | ICD-10-CM | POA: Diagnosis not present

## 2017-06-19 DIAGNOSIS — H5213 Myopia, bilateral: Secondary | ICD-10-CM | POA: Diagnosis not present

## 2017-06-19 DIAGNOSIS — H52223 Regular astigmatism, bilateral: Secondary | ICD-10-CM | POA: Diagnosis not present

## 2017-07-02 ENCOUNTER — Encounter: Payer: Self-pay | Admitting: Family Medicine

## 2017-07-02 ENCOUNTER — Other Ambulatory Visit: Payer: Self-pay | Admitting: Family Medicine

## 2017-07-02 DIAGNOSIS — E291 Testicular hypofunction: Secondary | ICD-10-CM

## 2017-07-02 MED ORDER — LOSARTAN POTASSIUM 50 MG PO TABS: 50.0000 mg | ORAL_TABLET | Freq: Every day | ORAL | 3 refills | Status: DC

## 2017-07-02 MED ORDER — TADALAFIL 20 MG PO TABS: 10.0000 mg | ORAL_TABLET | ORAL | 11 refills | Status: DC | PRN

## 2017-07-04 ENCOUNTER — Other Ambulatory Visit: Payer: Self-pay | Admitting: Family Medicine

## 2017-08-07 ENCOUNTER — Encounter: Payer: Self-pay | Admitting: Family Medicine

## 2017-08-07 DIAGNOSIS — E7801 Familial hypercholesterolemia: Secondary | ICD-10-CM

## 2017-08-07 DIAGNOSIS — R739 Hyperglycemia, unspecified: Secondary | ICD-10-CM

## 2017-08-07 DIAGNOSIS — E7800 Pure hypercholesterolemia, unspecified: Secondary | ICD-10-CM

## 2017-08-09 DIAGNOSIS — R739 Hyperglycemia, unspecified: Secondary | ICD-10-CM | POA: Diagnosis not present

## 2017-08-09 DIAGNOSIS — E7800 Pure hypercholesterolemia, unspecified: Secondary | ICD-10-CM | POA: Diagnosis not present

## 2017-08-09 DIAGNOSIS — E7801 Familial hypercholesterolemia: Secondary | ICD-10-CM | POA: Diagnosis not present

## 2017-08-10 LAB — LIPID PANEL W/REFLEX DIRECT LDL
CHOLESTEROL: 170 mg/dL (ref <200)
HDL: 39 mg/dL — ABNORMAL LOW (ref >40)
LDL CHOLESTEROL (CALC): 115 mg/dL — AB
Non-HDL Cholesterol (Calc): 131 mg/dL (calc) — ABNORMAL HIGH (ref <130)
Total CHOL/HDL Ratio: 4.4 (calc) (ref <5.0)
Triglycerides: 70 mg/dL (ref <150)

## 2017-08-10 LAB — HEMOGLOBIN A1C
Hgb A1c MFr Bld: 5.7 % of total Hgb — ABNORMAL HIGH (ref <5.7)
Mean Plasma Glucose: 117 (calc)
eAG (mmol/L): 6.5 (calc)

## 2017-08-30 DIAGNOSIS — F4320 Adjustment disorder, unspecified: Secondary | ICD-10-CM | POA: Diagnosis not present

## 2017-09-30 DIAGNOSIS — F4320 Adjustment disorder, unspecified: Secondary | ICD-10-CM | POA: Diagnosis not present

## 2017-10-14 DIAGNOSIS — F4320 Adjustment disorder, unspecified: Secondary | ICD-10-CM | POA: Diagnosis not present

## 2017-10-22 DIAGNOSIS — F4320 Adjustment disorder, unspecified: Secondary | ICD-10-CM | POA: Diagnosis not present

## 2017-10-23 ENCOUNTER — Other Ambulatory Visit: Payer: Self-pay | Admitting: Family Medicine

## 2017-10-23 DIAGNOSIS — E291 Testicular hypofunction: Secondary | ICD-10-CM

## 2017-10-24 ENCOUNTER — Encounter: Payer: Self-pay | Admitting: Family Medicine

## 2017-10-24 DIAGNOSIS — E291 Testicular hypofunction: Secondary | ICD-10-CM

## 2017-10-25 ENCOUNTER — Encounter: Payer: Self-pay | Admitting: Family Medicine

## 2017-10-25 MED ORDER — TESTOSTERONE CYPIONATE 200 MG/ML IM SOLN: 200.0000 mg | INTRAMUSCULAR | 1 refills | Status: DC

## 2017-11-15 ENCOUNTER — Encounter: Payer: Self-pay | Admitting: Family Medicine

## 2017-11-15 DIAGNOSIS — E785 Hyperlipidemia, unspecified: Secondary | ICD-10-CM

## 2017-11-15 DIAGNOSIS — E7800 Pure hypercholesterolemia, unspecified: Secondary | ICD-10-CM

## 2017-11-15 DIAGNOSIS — R739 Hyperglycemia, unspecified: Secondary | ICD-10-CM

## 2017-11-15 DIAGNOSIS — I10 Essential (primary) hypertension: Secondary | ICD-10-CM

## 2017-11-15 DIAGNOSIS — E291 Testicular hypofunction: Secondary | ICD-10-CM

## 2017-11-18 DIAGNOSIS — I10 Essential (primary) hypertension: Secondary | ICD-10-CM | POA: Diagnosis not present

## 2017-11-18 DIAGNOSIS — E785 Hyperlipidemia, unspecified: Secondary | ICD-10-CM | POA: Diagnosis not present

## 2017-11-18 DIAGNOSIS — E7800 Pure hypercholesterolemia, unspecified: Secondary | ICD-10-CM | POA: Diagnosis not present

## 2017-11-18 DIAGNOSIS — E291 Testicular hypofunction: Secondary | ICD-10-CM | POA: Diagnosis not present

## 2017-11-19 LAB — LIPID PANEL
CHOL/HDL RATIO: 3.9 (calc) (ref <5.0)
Cholesterol: 172 mg/dL (ref <200)
HDL: 44 mg/dL (ref >40)
LDL CHOLESTEROL (CALC): 115 mg/dL — AB
NON-HDL CHOLESTEROL (CALC): 128 mg/dL (ref <130)
Triglycerides: 50 mg/dL (ref <150)

## 2017-11-19 LAB — COMPLETE METABOLIC PANEL WITH GFR
AG RATIO: 1.6 (calc) (ref 1.0–2.5)
ALT: 13 U/L (ref 9–46)
AST: 11 U/L (ref 10–40)
Albumin: 4.1 g/dL (ref 3.6–5.1)
Alkaline phosphatase (APISO): 49 U/L (ref 40–115)
BUN: 12 mg/dL (ref 7–25)
CALCIUM: 9.5 mg/dL (ref 8.6–10.3)
CO2: 27 mmol/L (ref 20–32)
CREATININE: 1.11 mg/dL (ref 0.60–1.35)
Chloride: 107 mmol/L (ref 98–110)
GFR, EST NON AFRICAN AMERICAN: 80 mL/min/{1.73_m2} (ref >=60)
GFR, Est African American: 93 mL/min/{1.73_m2} (ref >=60)
GLUCOSE: 99 mg/dL (ref 65–99)
Globulin: 2.5 g/dL (calc) (ref 1.9–3.7)
Potassium: 4.7 mmol/L (ref 3.5–5.3)
SODIUM: 141 mmol/L (ref 135–146)
Total Bilirubin: 0.6 mg/dL (ref 0.2–1.2)
Total Protein: 6.6 g/dL (ref 6.1–8.1)

## 2017-11-19 LAB — TESTOSTERONE: TESTOSTERONE: 610 ng/dL (ref 250–827)

## 2017-11-19 LAB — CBC
HCT: 44.5 % (ref 38.5–50.0)
Hemoglobin: 14.5 g/dL (ref 13.2–17.1)
MCH: 28.3 pg (ref 27.0–33.0)
MCHC: 32.6 g/dL (ref 32.0–36.0)
MCV: 86.9 fL (ref 80.0–100.0)
MPV: 9.1 fL (ref 7.5–12.5)
PLATELETS: 435 10*3/uL — AB (ref 140–400)
RBC: 5.12 10*6/uL (ref 4.20–5.80)
RDW: 13.6 % (ref 11.0–15.0)
WBC: 4.2 10*3/uL (ref 3.8–10.8)

## 2017-11-19 LAB — PSA: PSA: 2.1 ng/mL (ref <=4.0)

## 2017-11-20 ENCOUNTER — Encounter: Payer: Self-pay | Admitting: Family Medicine

## 2017-11-20 DIAGNOSIS — E291 Testicular hypofunction: Secondary | ICD-10-CM

## 2017-11-22 ENCOUNTER — Encounter: Payer: Self-pay | Admitting: Family Medicine

## 2017-11-22 MED ORDER — TESTOSTERONE CYPIONATE 200 MG/ML IM SOLN: 240.0000 mg | INTRAMUSCULAR | 1 refills | Status: DC

## 2017-11-22 NOTE — Addendum Note (Signed)
Addended by: Gregor Hams on: 11/22/2017 02:06 PM   Modules accepted: Orders

## 2017-12-18 ENCOUNTER — Encounter: Payer: Self-pay | Admitting: Family Medicine

## 2017-12-18 DIAGNOSIS — E291 Testicular hypofunction: Secondary | ICD-10-CM

## 2017-12-18 MED ORDER — TESTOSTERONE CYPIONATE 200 MG/ML IM SOLN: 240.0000 mg | INTRAMUSCULAR | 1 refills | Status: DC

## 2017-12-19 ENCOUNTER — Encounter: Payer: Self-pay | Admitting: Family Medicine

## 2018-02-11 ENCOUNTER — Other Ambulatory Visit: Payer: Self-pay | Admitting: Family Medicine

## 2018-02-11 DIAGNOSIS — R0602 Shortness of breath: Secondary | ICD-10-CM

## 2018-02-17 ENCOUNTER — Encounter: Payer: Self-pay | Admitting: Family Medicine

## 2018-03-10 ENCOUNTER — Encounter: Payer: Self-pay | Admitting: Family Medicine

## 2018-03-10 ENCOUNTER — Ambulatory Visit (INDEPENDENT_AMBULATORY_CARE_PROVIDER_SITE_OTHER): Payer: BLUE CROSS/BLUE SHIELD | Admitting: Family Medicine

## 2018-03-10 ENCOUNTER — Ambulatory Visit (INDEPENDENT_AMBULATORY_CARE_PROVIDER_SITE_OTHER): Payer: BLUE CROSS/BLUE SHIELD

## 2018-03-10 VITALS — BP 136/86 | HR 89 | Ht 68.0 in | Wt 176.0 lb

## 2018-03-10 DIAGNOSIS — Z23 Encounter for immunization: Secondary | ICD-10-CM

## 2018-03-10 DIAGNOSIS — Z6826 Body mass index (BMI) 26.0-26.9, adult: Secondary | ICD-10-CM

## 2018-03-10 DIAGNOSIS — R0781 Pleurodynia: Secondary | ICD-10-CM | POA: Diagnosis not present

## 2018-03-10 DIAGNOSIS — I10 Essential (primary) hypertension: Secondary | ICD-10-CM

## 2018-03-10 DIAGNOSIS — E291 Testicular hypofunction: Secondary | ICD-10-CM

## 2018-03-10 DIAGNOSIS — Z0000 Encounter for general adult medical examination without abnormal findings: Secondary | ICD-10-CM

## 2018-03-10 DIAGNOSIS — Z862 Personal history of diseases of the blood and blood-forming organs and certain disorders involving the immune mechanism: Secondary | ICD-10-CM

## 2018-03-10 DIAGNOSIS — F410 Panic disorder [episodic paroxysmal anxiety] without agoraphobia: Secondary | ICD-10-CM

## 2018-03-10 DIAGNOSIS — S299XXA Unspecified injury of thorax, initial encounter: Secondary | ICD-10-CM | POA: Diagnosis not present

## 2018-03-10 IMAGING — DX DG RIBS W/ CHEST 3+V*R*
3 series · 3 of 3 positions shown · non-contrast
Comparison: Radiographs [DATE].

CLINICAL DATA: Right rib pain after fall several months ago.

EXAM:
RIGHT RIBS AND CHEST - 3+ VIEW

[chest pa]
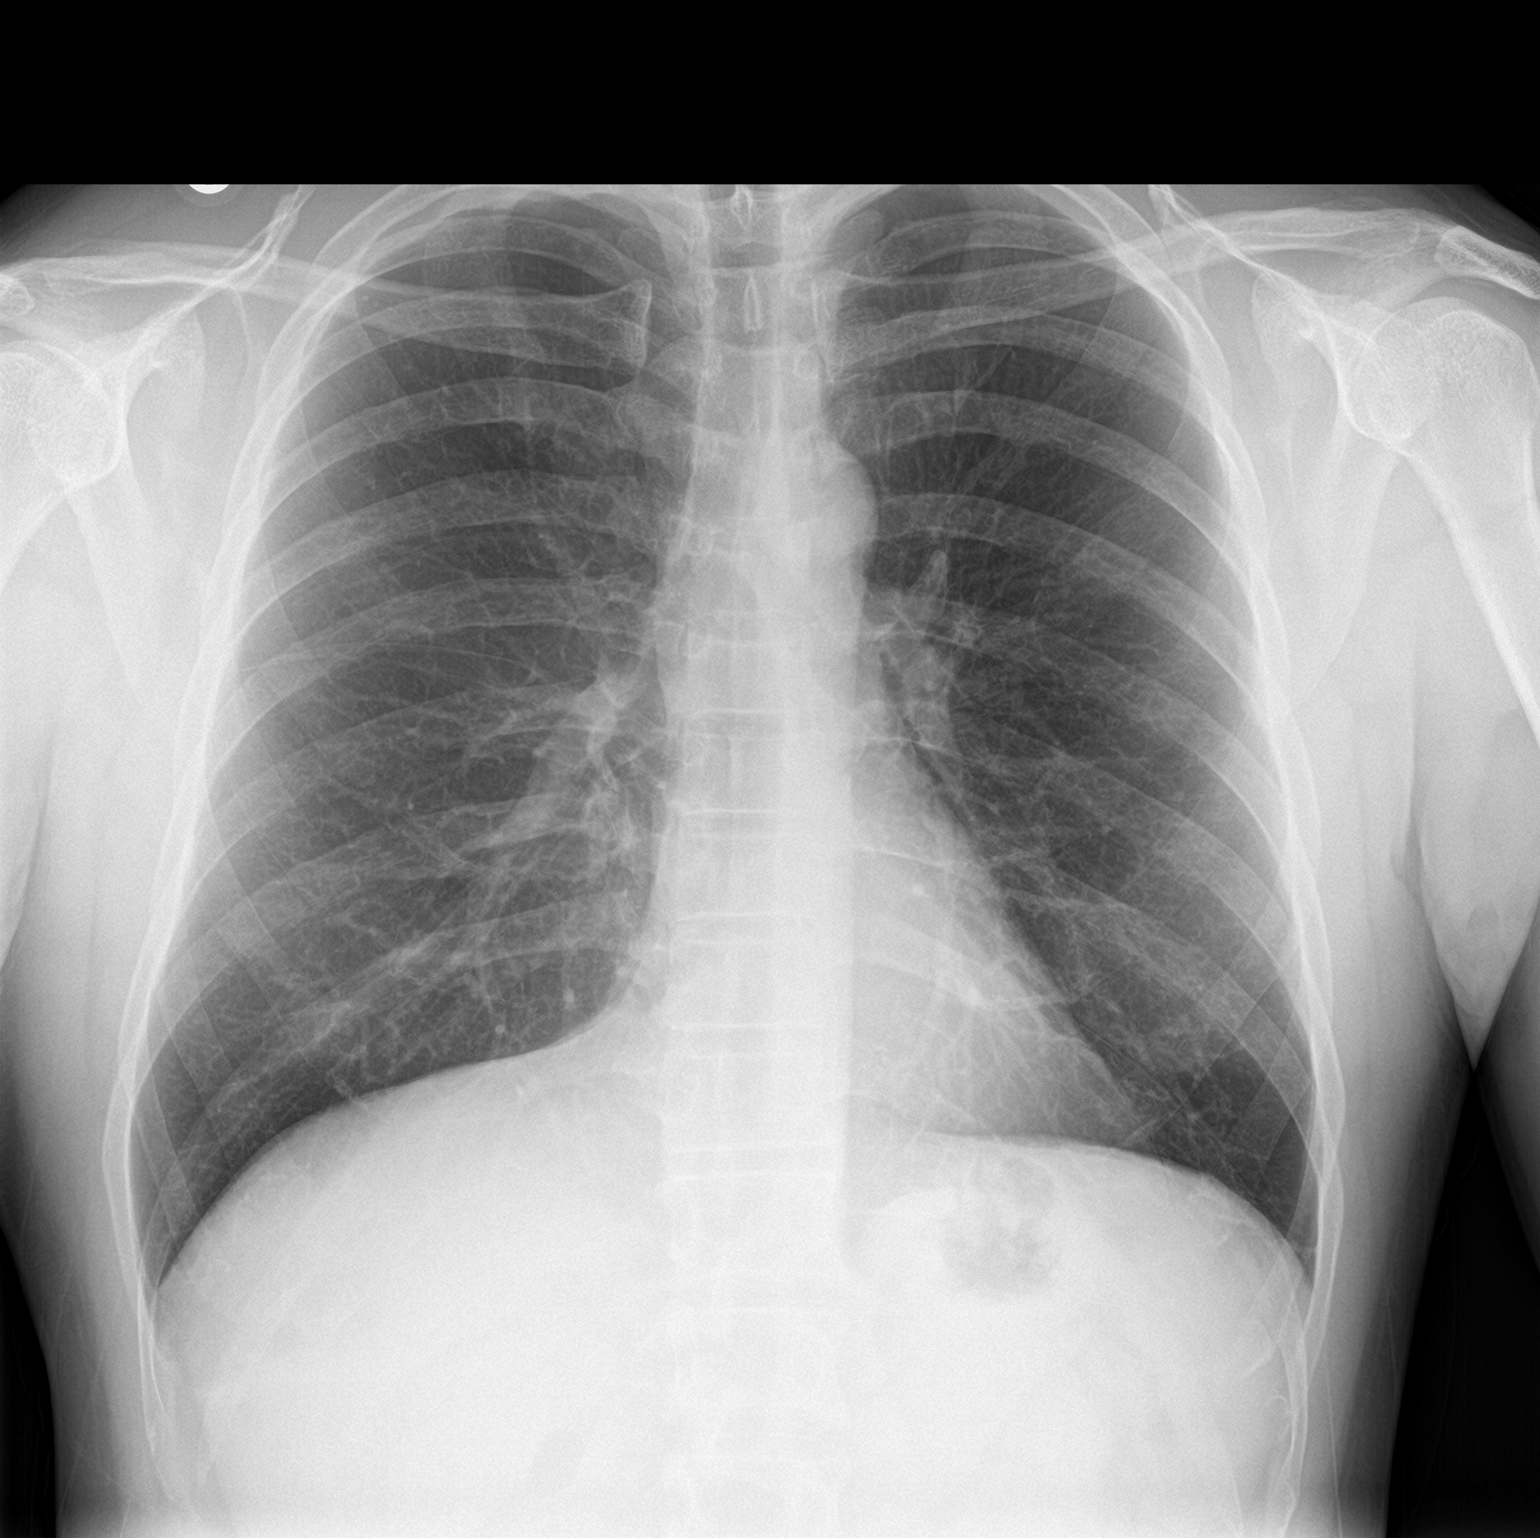

[rib pa]
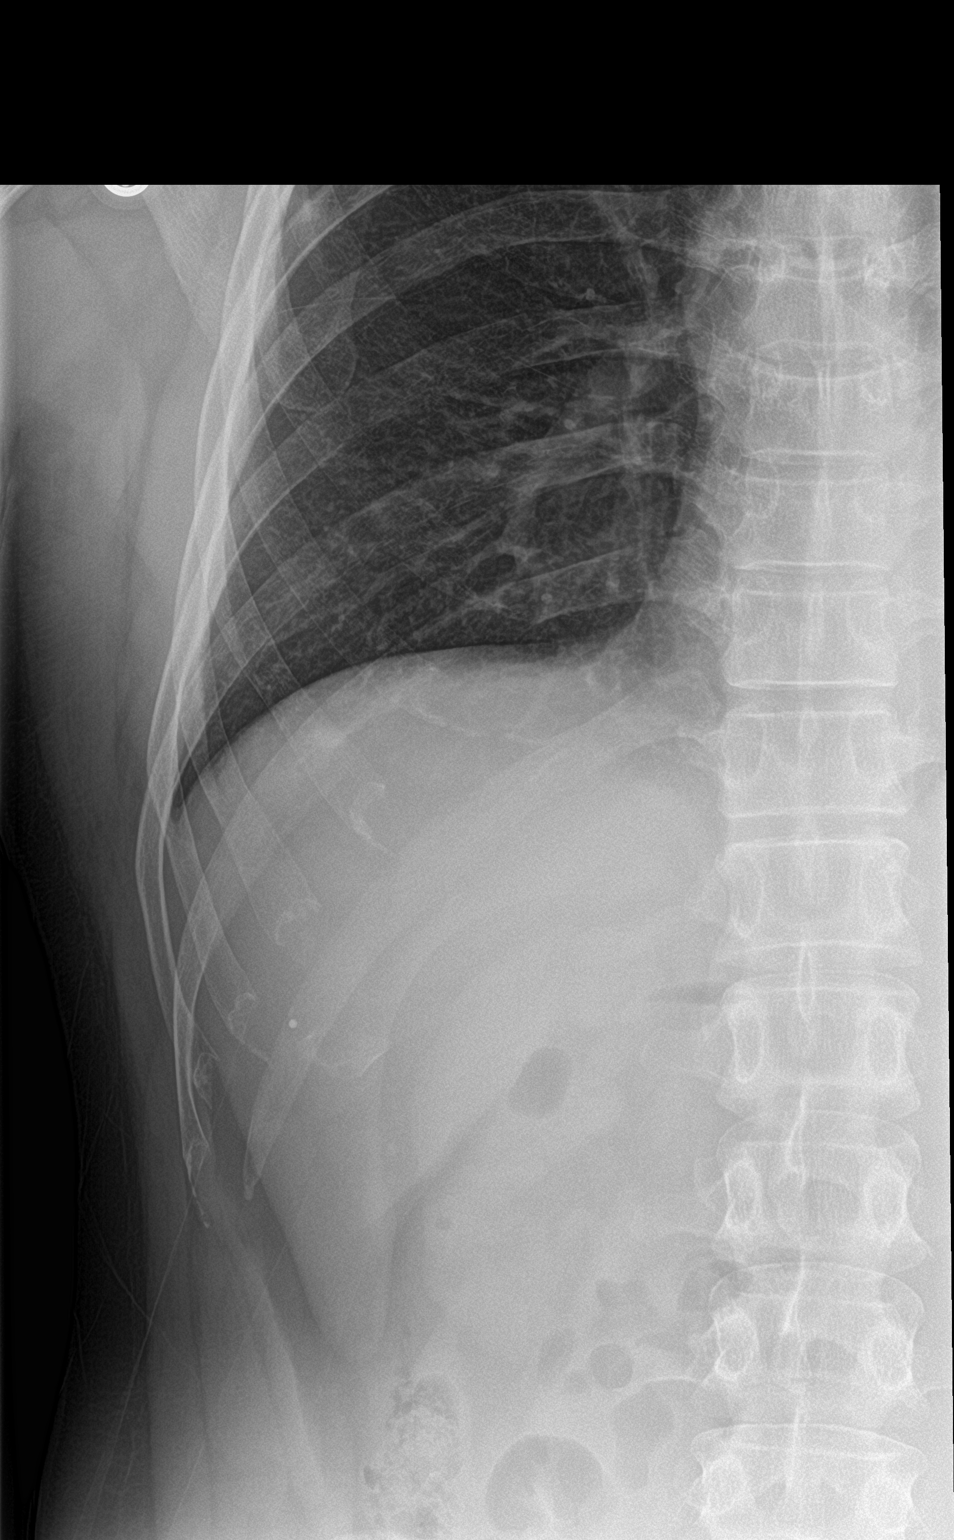

[rib pa obl]
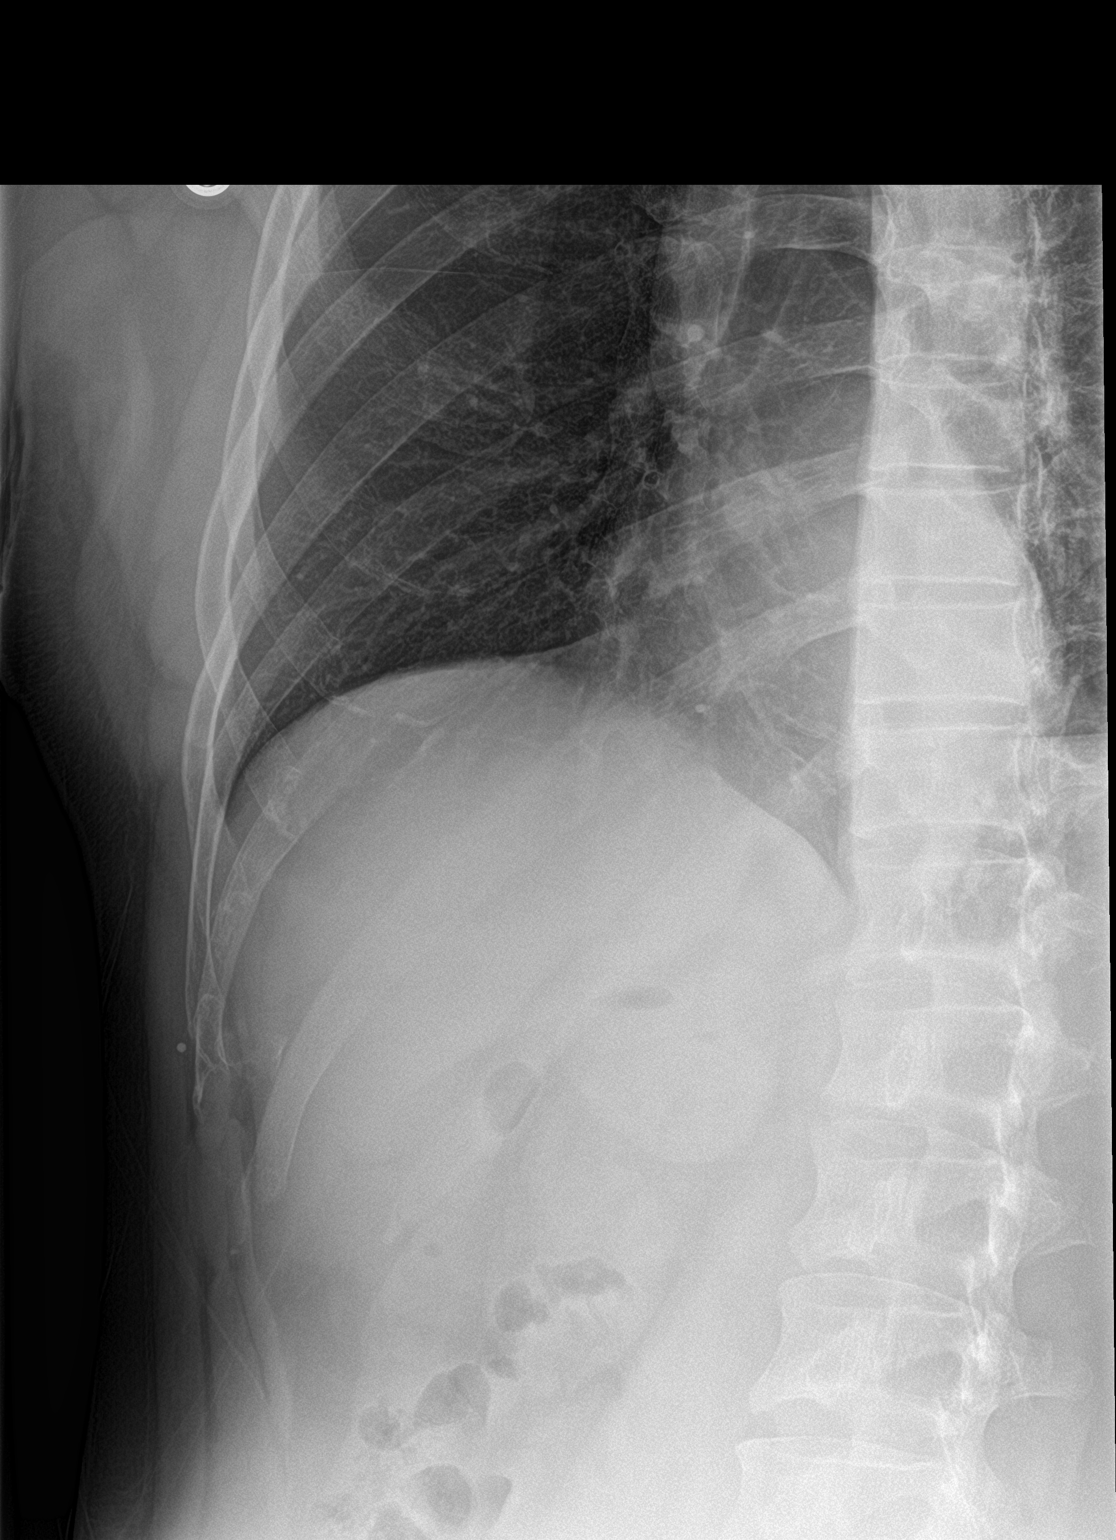

[3 of 3 positions shown; findings below may reference images not displayed]

FINDINGS: No fracture or other bone lesions are seen involving the ribs. There
is no evidence of pneumothorax or pleural effusion. Both lungs are
clear. Heart size and mediastinal contours are within normal limits.
IMPRESSION: Negative.

## 2018-03-10 MED ORDER — TADALAFIL 20 MG PO TABS: 10.0000 mg | ORAL_TABLET | ORAL | 11 refills | Status: DC | PRN

## 2018-03-10 MED ORDER — VARENICLINE TARTRATE 1 MG PO TABS: 1.0000 mg | ORAL_TABLET | Freq: Two times a day (BID) | ORAL | 3 refills | Status: DC

## 2018-03-10 MED ORDER — VARENICLINE TARTRATE 0.5 MG X 11 & 1 MG X 42 PO MISC: ORAL | 0 refills | Status: DC

## 2018-03-10 NOTE — Patient Instructions (Addendum)
Thank you for coming in today. Continue current medicines.  You can get cilias cheaper using good rx.   Chantix starter pack and then months 2 and 3.  Cut out vape.   Work on core stability exercises.  Piliates type exercise are helpful.  Physical therapy may help.    Generally check back in 6 months or sooner if needed.   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  We can do medicines if needed.  OK to keep an eye on it.  Figure out migraine triggers.     Varenicline oral tablets What is this medicine? VARENICLINE (var EN i kleen) is used to help people quit smoking. It is used with a patient support program recommended by your physician. This medicine may be used for other purposes; ask your health care provider or pharmacist if you have questions. COMMON BRAND NAME(S): Chantix What should I tell my health care provider before I take this medicine? They need to know if you have any of these conditions: -heart disease -if you often drink alcohol -kidney disease -mental illness -on hemodialysis -seizures -history of stroke -suicidal thoughts, plans, or attempt; a previous suicide attempt by you or a family member -an unusual or allergic reaction to varenicline, other medicines, foods, dyes, or preservatives -pregnant or trying to get pregnant -breast-feeding How should I use this medicine? Take this medicine by mouth after eating. Take with a full glass of water. Follow the directions on the prescription label. Take your doses at regular intervals. Do not take your medicine more often than directed. There are 3 ways you can use this medicine to help you quit smoking; talk to your health care professional to decide which plan is right for you: 1) you can choose a quit date and start this medicine 1 week before the quit date, or, 2) you can start taking this medicine before you choose a quit date, and then pick a quit date between day 8 and 35 days of treatment,  or, 3) if you are not sure that you are able or willing to quit smoking right away, start taking this medicine and slowly decrease the amount you smoke as directed by your health care professional with the goal of being cigarette-free by week 12 of treatment. Stick to your plan; ask about support groups or other ways to help you remain cigarette-free. If you are motivated to quit smoking and did not succeed during a previous attempt with this medicine for reasons other than side effects, or if you returned to smoking after this treatment, speak with your health care professional about whether another course of this medicine may be right for you. A special MedGuide will be given to you by the pharmacist with each prescription and refill. Be sure to read this information carefully each time. Talk to your pediatrician regarding the use of this medicine in children. This medicine is not approved for use in children. Overdosage: If you think you have taken too much of this medicine contact a poison control center or emergency room at once. NOTE: This medicine is only for you. Do not share this medicine with others. What if I miss a dose? If you miss a dose, take it as soon as you can. If it is almost time for your next dose, take only that dose. Do not take double or extra doses. What may interact with this medicine? -alcohol -insulin -other medicines used to help people quit smoking -theophylline -warfarin This list may  not describe all possible interactions. Give your health care provider a list of all the medicines, herbs, non-prescription drugs, or dietary supplements you use. Also tell them if you smoke, drink alcohol, or use illegal drugs. Some items may interact with your medicine. What should I watch for while using this medicine? It is okay if you do not succeed at your attempt to quit and have a cigarette. You can still continue your quit attempt and keep using this medicine as directed. Just  throw away your cigarettes and get back to your quit plan. Talk to your health care provider before using other treatments to quit smoking. Using this medicine with other treatments to quit smoking may increase the risk for side effects compared to using a treatment alone. You may get drowsy or dizzy. Do not drive, use machinery, or do anything that needs mental alertness until you know how this medicine affects you. Do not stand or sit up quickly, especially if you are an older patient. This reduces the risk of dizzy or fainting spells. Decrease the number of alcoholic beverages that you drink during treatment with this medicine until you know if this medicine affects your ability to tolerate alcohol. Some people have experienced increased drunkenness (intoxication), unusual or sometimes aggressive behavior, or no memory of things that have happened (amnesia) during treatment with this medicine. Sleepwalking can happen during treatment with this medicine, and can sometimes lead to behavior that is harmful to you, other people, or property. Stop taking this medicine and tell your doctor if you start sleepwalking or have other unusual sleep-related activity. After taking this medicine, you may get up out of bed and do an activity that you do not know you are doing. The next morning, you may have no memory of this. Activities include driving a car ("sleep-driving"), making and eating food, talking on the phone, sexual activity, and sleep-walking. Serious injuries have occurred. Stop the medicine and call your doctor right away if you find out you have done any of these activities. Do not take this medicine if you have used alcohol that evening. Do not take it if you have taken another medicine for sleep. The risk of doing these sleep-related activities is higher. Patients and their families should watch out for new or worsening depression or thoughts of suicide. Also watch out for sudden changes in feelings such  as feeling anxious, agitated, panicky, irritable, hostile, aggressive, impulsive, severely restless, overly excited and hyperactive, or not being able to sleep. If this happens, call your health care professional. If you have diabetes and you quit smoking, the effects of insulin may be increased and you may need to reduce your insulin dose. Check with your doctor or health care professional about how you should adjust your insulin dose. What side effects may I notice from receiving this medicine? Side effects that you should report to your doctor or health care professional as soon as possible: -allergic reactions like skin rash, itching or hives, swelling of the face, lips, tongue, or throat -acting aggressive, being angry or violent, or acting on dangerous impulses -breathing problems -changes in emotions or moods -chest pain or chest tightness -feeling faint or lightheaded, falls -hallucination, loss of contact with reality -mouth sores -redness, blistering, peeling or loosening of the skin, including inside the mouth -signs and symptoms of a stroke like changes in vision; confusion; trouble speaking or understanding; severe headaches; sudden numbness or weakness of the face, arm or leg; trouble walking; dizziness; loss of balance  or coordination -seizures -sleepwalking -suicidal thoughts or other mood changes Side effects that usually do not require medical attention (report to your doctor or health care professional if they continue or are bothersome): -constipation -gas -headache -nausea, vomiting -strange dreams -trouble sleeping This list may not describe all possible side effects. Call your doctor for medical advice about side effects. You may report side effects to FDA at 1-800-FDA-1088. Where should I keep my medicine? Keep out of the reach of children. Store at room temperature between 15 and 30 degrees C (59 and 86 degrees F). Throw away any unused medicine after the  expiration date. NOTE: This sheet is a summary. It may not cover all possible information. If you have questions about this medicine, talk to your doctor, pharmacist, or health care provider.  2019 Elsevier/Gold Standard (2017-08-23 12:48:08)

## 2018-03-10 NOTE — Progress Notes (Signed)
Justin Ashley is a 42 y.o. male who presents to [facility] today for well adult visit.   Justin Ashley is doing well overall.  He continues to vape and is interested in quitting vape if possible.  He is done some reading and would like to try Chantix.  He is tried Wellbutrin in the past which did not help much at all.  Additionally about 3 or 4 months ago while skateboarding he fell landing on his right side injuring his right lateral ribs.  He notes he had immediate severe pain and has slowly improved.  He does continue to have pain on his right lateral ribs especially with motion and deep inspiration.  He denies any trouble breathing.   He has also been having occasional migraine visual auras.  He will occasionally have migraine headaches but usually does not.  These are very infrequent and occur every few months.  He was seen by an optometrist and ophthalmologist who diagnosed him with migraines.  He has a new set of glasses that he thinks are not adjusted correctly which may be a trigger here.  He denies any weakness or numbness or loss of function.  He exercises occasionally.  He does not drink alcohol.  He tries eat a healthy diet.   ROS as above:  Past Medical History:  Diagnosis Date  . Dislocation of metatarsophalangeal joint of right great toe   . Erectile dysfunction 12/24/2012   BCBS will not cover Cialis   . Essential hypertension 06/30/2014  . History of thrombocytosis 03/09/2013  . Hypogonadism in male 12/15/2013   Recheck therapy January 2016   . Pneumonia age 1  . Suicide attempt ([place]) 12/10/2013   No past surgical history on file. Social History   Tobacco Use  . Smoking status: Former Smoker    Years: 22.00    Types: Cigarettes    Last attempt to quit: 03/12/2009    Years since quitting: 9.0  . Smokeless tobacco: Never Used  Substance Use Topics  .  Alcohol use: No   family history includes Cancer in his father and mother.  Medications: Current Outpatient Medications  Medication Sig Dispense Refill  . AMBULATORY NON FORMULARY MEDICATION BD 20mL syringe and BD 22G 1 and 1/2" needle, use to inject testosterone every two weeks.  BD 18G 1 and 1/2" needle used to draw up testosterone. Dx: Hypogonadism 20 Units 11  . AMBULATORY NON FORMULARY MEDICATION ProAir RespiClick.  Inhale two puffs every four hours only as needed for shortness of breath or wheezing. Use savings voucher. 1 Inhaler 0  . clonazePAM (KLONOPIN) 0.5 MG tablet Take 1 tablet (0.5 mg total) by mouth 2 (two) times daily as needed for anxiety. 30 tablet 0  . fluticasone (FLONASE) 50 MCG/ACT nasal spray INHALE 1 SPRAY IN EACH NOSTRIL TWICE DAILY( USE LEFT HAND FOR RIGHT NOSTRIL AND RIGHT HAND FOR LEFT NOSTRIL) 48 g 0  . losartan (COZAAR) 50 MG tablet TAKE 1 TABLET BY MOUTH ONCE DAILY 90 tablet 3  . tadalafil (ADCIRCA/CIALIS) 20 MG tablet Take 0.5-1 tablets (10-20 mg total) by mouth every other day as needed for erectile dysfunction. 30 tablet 11  . testosterone cypionate (DEPOTESTOSTERONE CYPIONATE) 200 MG/ML injection Inject 1.2 mLs (240 mg total) into the muscle every 14 (fourteen) days. 12 mL 1  . varenicline (CHANTIX STARTING MONTH PAK) 0.5 MG X 11 & 1 MG X 42 tablet Take one 0.5mg  tablet by mouth once  daily for 3 days, then increase to one 0.5mg  tablet twice daily for 3 days, then increase to one 1mg  tablet twice daily. 53 tablet 0  . varenicline (CHANTIX) 1 MG tablet Take 1 tablet (1 mg total) by mouth 2 (two) times daily. 60 tablet 3   No current facility-administered medications for this visit.    Allergies  Allergen Reactions  . Hydrocodone-Acetaminophen   . Lisinopril     Cough  . Oxycodone-Acetaminophen   . Zoloft [Sertraline Hcl]     Suicide attempt    Health Maintenance Health Maintenance  Topic Date Due  . TETANUS/TDAP  05/02/2026  . INFLUENZA VACCINE   Completed  . HIV Screening  Completed     Exam:  BP 136/86   Pulse 89   Ht 5\' 8"  (1.727 m)   Wt 176 lb (79.8 kg)   BMI 26.76 kg/m  Wt Readings from Last 5 Encounters:  03/10/18 176 lb (79.8 kg)  02/13/17 183 lb (83 kg)  02/06/17 181 lb (82.1 kg)  08/29/16 180 lb 12.8 oz (82 kg)  08/15/16 183 lb (83 kg)      Gen: Well NAD HEENT: EOMI,  MMM Lungs: Normal work of breathing. CTABL Heart: RRR no MRG Abd: NABS, Soft. Nondistended, Nontender Exts: Brisk capillary refill, warm and well perfused.  Psych: Alert and oriented normal speech thought process and affect.   Depression screen [facility] 2/9 03/10/2018 02/13/2017  Decreased Interest 0 0  Down, Depressed, Hopeless 0 0  PHQ - 2 Score 0 0       Lab and Radiology Results No results found for this or any previous visit (from the past 72 hour(s)). Dg Ribs Unilateral W/chest Right  Result Date: 03/10/2018 CLINICAL DATA:  Right rib pain after fall several months ago. EXAM: RIGHT RIBS AND CHEST - 3+ VIEW COMPARISON:  Radiographs of May 18, 2016. FINDINGS: No fracture or other bone lesions are seen involving the ribs. There is no evidence of pneumothorax or pleural effusion. Both lungs are clear. Heart size and mediastinal contours are within normal limits. IMPRESSION: Negative. Electronically Signed   By: Marijo Conception, M.D.   On: 03/10/2018 09:20  I personally (independently) visualized and performed the interpretation of the images attached in this note.  Lab Results  Component Value Date   CHOL 172 11/18/2017   HDL 44 11/18/2017   LDLCALC 115 (H) 11/18/2017   TRIG 50 11/18/2017   CHOLHDL 3.9 11/18/2017     Chemistry      Component Value Date/Time   NA 141 11/18/2017 0945   K 4.7 11/18/2017 0945   CL 107 11/18/2017 0945   CO2 27 11/18/2017 0945   BUN 12 11/18/2017 0945   CREATININE 1.11 11/18/2017 0945      Component Value Date/Time   CALCIUM 9.5 11/18/2017 0945   ALKPHOS 52 10/29/2016 1006   AST 11 11/18/2017  0945   ALT 13 11/18/2017 0945   BILITOT 0.6 11/18/2017 0945     Lab Results  Component Value Date   WBC 4.2 11/18/2017   HGB 14.5 11/18/2017   HCT 44.5 11/18/2017   MCV 86.9 11/18/2017   PLT 435 (H) 11/18/2017      The 10-year ASCVD risk score Mikey Bussing DC Jr., et al., 2013) is: 5.9%   Values used to calculate the score:     Age: 31 years     Sex: Male     Is Non-Hispanic African American: No     Diabetic: No  Tobacco smoker: Yes     Systolic Blood Pressure: 867 mmHg     Is BP treated: Yes     HDL Cholesterol: 44 mg/dL     Total Cholesterol: 172 mg/dL  Assessment and Plan: 42 y.o. male with  Well adult.  Doing reasonably well.   Agree with trying to quit vape/tobacco.  Plan for trial of Chantix.  Discussion regarding Chantix.  CVD risk reasonable.  Plan to continue current regimen.  Rib pain: No fractures.  Plan for home exercise program if not better proceed with physical therapy.  Hypertension well-controlled typically.  Testosterone doing well continue current regimen.    Orders Placed This Encounter  Procedures  . DG Ribs Unilateral W/Chest Right    Order Specific Question:   Reason for exam:    Answer:   Injury, rib pain. assess for fracture, pneumothorax. fall Aug continued pain    Order Specific Question:   Preferred imaging location?    Answer:   [facility]  . Flu Vaccine QUAD 36+ mos IM   Meds ordered this encounter  Medications  . tadalafil (ADCIRCA/CIALIS) 20 MG tablet    Sig: Take 0.5-1 tablets (10-20 mg total) by mouth every other day as needed for erectile dysfunction.    Dispense:  30 tablet    Refill:  11  . varenicline (CHANTIX STARTING MONTH PAK) 0.5 MG X 11 & 1 MG X 42 tablet    Sig: Take one 0.5mg  tablet by mouth once daily for 3 days, then increase to one 0.5mg  tablet twice daily for 3 days, then increase to one 1mg  tablet twice daily.    Dispense:  53 tablet    Refill:  0  . varenicline (CHANTIX) 1 MG tablet    Sig: Take  1 tablet (1 mg total) by mouth 2 (two) times daily.    Dispense:  60 tablet    Refill:  3     Discussed warning signs or symptoms. Please see discharge instructions. Patient expresses understanding.

## 2018-04-07 ENCOUNTER — Encounter: Payer: Self-pay | Admitting: Family Medicine

## 2018-04-08 MED ORDER — VARENICLINE TARTRATE 1 MG PO TABS: 1.0000 mg | ORAL_TABLET | Freq: Two times a day (BID) | ORAL | 3 refills | Status: DC

## 2018-05-08 ENCOUNTER — Encounter: Payer: Self-pay | Admitting: Family Medicine

## 2018-05-12 ENCOUNTER — Ambulatory Visit: Payer: BLUE CROSS/BLUE SHIELD | Admitting: Family Medicine

## 2018-05-12 ENCOUNTER — Encounter: Payer: Self-pay | Admitting: Family Medicine

## 2018-05-12 VITALS — BP 124/79 | HR 75 | Wt 174.0 lb

## 2018-05-12 DIAGNOSIS — Q139 Congenital malformation of anterior segment of eye, unspecified: Secondary | ICD-10-CM | POA: Diagnosis not present

## 2018-05-12 DIAGNOSIS — Z538 Procedure and treatment not carried out for other reasons: Secondary | ICD-10-CM | POA: Diagnosis not present

## 2018-05-12 DIAGNOSIS — E291 Testicular hypofunction: Secondary | ICD-10-CM

## 2018-05-12 DIAGNOSIS — Z862 Personal history of diseases of the blood and blood-forming organs and certain disorders involving the immune mechanism: Secondary | ICD-10-CM

## 2018-05-12 MED ORDER — POLYMYXIN B-TRIMETHOPRIM 10000-0.1 UNIT/ML-% OP SOLN: 1.0000 [drp] | OPHTHALMIC | 0 refills | Status: DC

## 2018-05-12 MED ORDER — DICLOFENAC SODIUM 0.1 % OP SOLN: 2.0000 [drp] | Freq: Four times a day (QID) | OPHTHALMIC | 0 refills | Status: DC

## 2018-05-12 NOTE — Progress Notes (Signed)
Justin Ashley is a 43 y.o. male who presents to [place]: [place] today for Right eye pain and redness starting Thursday.  He denies any injury.  He notes pain is mild.  He notes that itchy sensation as well.  He denies any significant blurry vision.  He denies severe pain with light.  He denies any injury or foreign body.  He has been using some Zaditor eyedrops which do not help much.    ROS as above:  Exam:  BP 124/79   Pulse 75   Wt 174 lb (78.9 kg)   BMI 26.46 kg/m  Wt Readings from Last 5 Encounters:  05/12/18 174 lb (78.9 kg)  03/10/18 176 lb (79.8 kg)  02/13/17 183 lb (83 kg)  02/06/17 181 lb (82.1 kg)  08/29/16 180 lb 12.8 oz (82 kg)    Gen: Well NAD HEENT: EOMI,  MMM right medial sclera injection with redness.  No corneal involvement or corneal abrasion.  Normal eye motion. Lungs: Normal work of breathing. CTABL Heart: RRR no MRG Abd: NABS, Soft. Nondistended, Nontender Exts: Brisk capillary refill, warm and well perfused.   Lab and Radiology Results No results found for this or any previous visit (from the past 72 hour(s)). No results found.    Assessment and Plan: 43 y.o. male with right sclera conjunctival injection.  Likely viral conjunctivitis however symptoms are a bit more severe than typical.  Plan for diclofenac eyedrops as well as Polytrim eyedrops.  Additionally use warm compress.  If not better neck step would be referral to ophthalmology.  Additionally will check basic fasting labs in the next week to follow-up testosterone which is doing well.  PDMP not reviewed this encounter. Orders Placed This Encounter  Procedures  . CBC  . COMPLETE METABOLIC PANEL WITH GFR  . PSA  . Testosterone   Meds ordered this encounter  Medications  . trimethoprim-polymyxin b (POLYTRIM) ophthalmic solution    Sig: Place 1 drop into the right eye every  4 (four) hours.    Dispense:  10 mL    Refill:  0  . diclofenac (VOLTAREN) 0.1 % ophthalmic solution    Sig: Place 2 drops into the right eye 4 (four) times daily.    Dispense:  5 mL    Refill:  0     Historical information moved to improve visibility of documentation.  Past Medical History:  Diagnosis Date  . Dislocation of metatarsophalangeal joint of right great toe   . Erectile dysfunction 12/24/2012   BCBS will not cover Cialis   . Essential hypertension 06/30/2014  . History of thrombocytosis 03/09/2013  . Hypogonadism in male 12/15/2013   Recheck therapy January 2016   . Pneumonia age 43  . Suicide attempt ([place]) 12/10/2013   No past surgical history on file. Social History   Tobacco Use  . Smoking status: Former Smoker    Years: 22.00    Types: Cigarettes    Last attempt to quit: 03/12/2009    Years since quitting: 9.1  . Smokeless tobacco: Never Used  Substance Use Topics  . Alcohol use: No   family history includes Cancer in his father and mother.  Medications: Current Outpatient Medications  Medication Sig Dispense Refill  . AMBULATORY NON FORMULARY MEDICATION BD 93mL syringe and BD 22G 1 and 1/2" needle, use to inject testosterone every two weeks.  BD 18G 1 and 1/2" needle used to draw up testosterone. Dx: Hypogonadism  20 Units 11  . AMBULATORY NON FORMULARY MEDICATION ProAir RespiClick.  Inhale two puffs every four hours only as needed for shortness of breath or wheezing. Use savings voucher. 1 Inhaler 0  . clonazePAM (KLONOPIN) 0.5 MG tablet Take 1 tablet (0.5 mg total) by mouth 2 (two) times daily as needed for anxiety. 30 tablet 0  . fluticasone (FLONASE) 50 MCG/ACT nasal spray INHALE 1 SPRAY IN EACH NOSTRIL TWICE DAILY( USE LEFT HAND FOR RIGHT NOSTRIL AND RIGHT HAND FOR LEFT NOSTRIL) 48 g 0  . losartan (COZAAR) 50 MG tablet TAKE 1 TABLET BY MOUTH ONCE DAILY 90 tablet 3  . tadalafil (ADCIRCA/CIALIS) 20 MG tablet Take 0.5-1 tablets (10-20 mg total) by mouth every  other day as needed for erectile dysfunction. 30 tablet 11  . testosterone cypionate (DEPOTESTOSTERONE CYPIONATE) 200 MG/ML injection Inject 1.2 mLs (240 mg total) into the muscle every 14 (fourteen) days. 12 mL 1  . varenicline (CHANTIX) 1 MG tablet Take 1 tablet (1 mg total) by mouth 2 (two) times daily. 60 tablet 3  . diclofenac (VOLTAREN) 0.1 % ophthalmic solution Place 2 drops into the right eye 4 (four) times daily. 5 mL 0  . trimethoprim-polymyxin b (POLYTRIM) ophthalmic solution Place 1 drop into the right eye every 4 (four) hours. 10 mL 0   No current facility-administered medications for this visit.    Allergies  Allergen Reactions  . Hydrocodone-Acetaminophen   . Lisinopril     Cough  . Oxycodone-Acetaminophen   . Zoloft [Sertraline Hcl]     Suicide attempt     Discussed warning signs or symptoms. Please see discharge instructions. Patient expresses understanding.

## 2018-05-12 NOTE — Patient Instructions (Addendum)
Thank you for coming in today. Use the diclofenac eye drops 4x daily for pain as needed.  Add Polytrim antibiotic eye drops.  Use warm compress. This is probably the most helpful.   If you are not improving follow up with [facility] surgeons [phone].  If you cant get an appointment or need a referral let me know.  Makes sure they send notes.    Check fasting labs in about 1-2 weeks off cycle.   Viral Conjunctivitis, Adult  Viral conjunctivitis is an inflammation of the clear membrane that covers the white part of your eye and the inner surface of your eyelid (conjunctiva). The inflammation is caused by a viral infection. The blood vessels in the conjunctiva become inflamed, causing the eye to become red or pink, and often itchy. Viral conjunctivitis can be easily passed from one person to another (is contagious). This condition is often called pink eye. What are the causes? This condition is caused by a virus. A virus is a type of contagious germ. It can be spread by touching objects that have been contaminated with the virus, such as doorknobs or towels. It can also be passed through droplets, such as from coughing or sneezing. What are the signs or symptoms? Symptoms of this condition include:  Eye redness.  Tearing or watery eyes.  Itchy and irritated eyes.  Burning feeling in the eyes.  Clear drainage from the eye.  Swollen eyelids.  A gritty feeling in the eye.  Light sensitivity. This condition often occurs with other symptoms, such as a fever, nausea, or a rash. How is this diagnosed? This condition is diagnosed with a medical history and physical exam. If you have discharge from your eye, the discharge may be tested to rule out other causes of conjunctivitis. How is this treated? Viral conjunctivitis does not respond to medicines that kill bacteria (antibiotics). Treatment for viral conjunctivitis is directed at stopping a bacterial infection from  developing in addition to the viral infection. Treatment also aims to relieve your symptoms, such as itching. This may be done with antihistamine drops or other eye medicines. Rarely, steroid eye drops or antiviral medicines may be prescribed. Follow these instructions at home: Medicines   Take or apply over-the-counter and prescription medicines only as told by your health care provider.  Be very careful to avoid touching the edge of the eyelid with the eye drop bottle or ointment tube when applying medicines to the affected eye. Being careful this way will stop you from spreading the infection to the other eye or to other people. Eye care  Avoid touching or rubbing your eyes.  Apply a warm, wet, clean washcloth to your eye for 10-20 minutes, 3-4 times per day or as told by your health care provider.  If you wear contact lenses, do not wear them until the inflammation is gone and your health care provider says it is safe to wear them again. Ask your health care provider how to sterilize or replace your contact lenses before using them again. Wear glasses until you can resume wearing contacts.  Avoid wearing eye makeup until the inflammation is gone. Throw away any old eye cosmetics that may be contaminated.  Gently wipe away any drainage from your eye with a warm, wet washcloth or a cotton ball. General instructions  Change or wash your pillowcase every day or as told by your health care provider.  Do not share towels, pillowcases, washcloths, eye makeup, makeup brushes, contact lenses, or glasses.  This may spread the infection.  Wash your hands often with soap and water. Use paper towels to dry your hands. If soap and water are not available, use hand sanitizer.  Try to avoid contact with other people for one week or as told by your health care provider. Contact a health care provider if:  Your symptoms do not improve with treatment or they get worse.  You have increased  pain.  Your vision becomes blurry.  You have a fever.  You have facial pain, redness, or swelling.  You have yellow or green drainage coming from your eye.  You have new symptoms. This information is not intended to replace advice given to you by your health care provider. Make sure you discuss any questions you have with your health care provider. Document Released: 05/19/2002 Document Revised: 09/24/2015 Document Reviewed: 09/13/2015 Elsevier Interactive Patient Education  2019 [employer].

## 2018-05-19 DIAGNOSIS — E291 Testicular hypofunction: Secondary | ICD-10-CM | POA: Diagnosis not present

## 2018-05-19 DIAGNOSIS — Z862 Personal history of diseases of the blood and blood-forming organs and certain disorders involving the immune mechanism: Secondary | ICD-10-CM | POA: Diagnosis not present

## 2018-05-19 DIAGNOSIS — Z538 Procedure and treatment not carried out for other reasons: Secondary | ICD-10-CM | POA: Diagnosis not present

## 2018-05-20 LAB — COMPLETE METABOLIC PANEL WITH GFR
AG RATIO: 1.6 (calc) (ref 1.0–2.5)
ALT: 14 U/L (ref 9–46)
AST: 12 U/L (ref 10–40)
Albumin: 4.3 g/dL (ref 3.6–5.1)
Alkaline phosphatase (APISO): 50 U/L (ref 36–130)
BILIRUBIN TOTAL: 0.6 mg/dL (ref 0.2–1.2)
BUN: 10 mg/dL (ref 7–25)
CHLORIDE: 105 mmol/L (ref 98–110)
CO2: 26 mmol/L (ref 20–32)
Calcium: 9.9 mg/dL (ref 8.6–10.3)
Creat: 1.07 mg/dL (ref 0.60–1.35)
GFR, Est African American: 97 mL/min/{1.73_m2} (ref >=60)
GFR, Est Non African American: 84 mL/min/{1.73_m2} (ref >=60)
Globulin: 2.7 g/dL (calc) (ref 1.9–3.7)
Glucose, Bld: 97 mg/dL (ref 65–99)
Potassium: 4.3 mmol/L (ref 3.5–5.3)
Sodium: 141 mmol/L (ref 135–146)
Total Protein: 7 g/dL (ref 6.1–8.1)

## 2018-05-20 LAB — TESTOSTERONE: Testosterone: 597 ng/dL (ref 250–827)

## 2018-05-20 LAB — CBC
HCT: 44 % (ref 38.5–50.0)
Hemoglobin: 14.6 g/dL (ref 13.2–17.1)
MCH: 28.9 pg (ref 27.0–33.0)
MCHC: 33.2 g/dL (ref 32.0–36.0)
MCV: 87.1 fL (ref 80.0–100.0)
MPV: 9.5 fL (ref 7.5–12.5)
Platelets: 454 10*3/uL — ABNORMAL HIGH (ref 140–400)
RBC: 5.05 10*6/uL (ref 4.20–5.80)
RDW: 13.2 % (ref 11.0–15.0)
WBC: 6 10*3/uL (ref 3.8–10.8)

## 2018-05-20 LAB — PSA: PSA: 1.7 ng/mL (ref <=4.0)

## 2018-05-29 ENCOUNTER — Encounter: Payer: Self-pay | Admitting: Family Medicine

## 2018-09-05 ENCOUNTER — Encounter: Payer: Self-pay | Admitting: Family Medicine

## 2018-09-05 DIAGNOSIS — R002 Palpitations: Secondary | ICD-10-CM

## 2018-09-05 DIAGNOSIS — R1013 Epigastric pain: Secondary | ICD-10-CM

## 2018-09-05 DIAGNOSIS — I10 Essential (primary) hypertension: Secondary | ICD-10-CM

## 2018-09-05 DIAGNOSIS — E291 Testicular hypofunction: Secondary | ICD-10-CM

## 2018-09-05 DIAGNOSIS — Z862 Personal history of diseases of the blood and blood-forming organs and certain disorders involving the immune mechanism: Secondary | ICD-10-CM

## 2018-09-09 ENCOUNTER — Encounter: Payer: Self-pay | Admitting: Family Medicine

## 2018-09-09 ENCOUNTER — Ambulatory Visit (INDEPENDENT_AMBULATORY_CARE_PROVIDER_SITE_OTHER): Payer: BC Managed Care – PPO | Admitting: Family Medicine

## 2018-09-09 VITALS — BP 148/91 | HR 91 | Ht 68.0 in | Wt 179.0 lb

## 2018-09-09 DIAGNOSIS — F410 Panic disorder [episodic paroxysmal anxiety] without agoraphobia: Secondary | ICD-10-CM

## 2018-09-09 DIAGNOSIS — G43809 Other migraine, not intractable, without status migrainosus: Secondary | ICD-10-CM | POA: Diagnosis not present

## 2018-09-09 DIAGNOSIS — I10 Essential (primary) hypertension: Secondary | ICD-10-CM | POA: Diagnosis not present

## 2018-09-09 DIAGNOSIS — E291 Testicular hypofunction: Secondary | ICD-10-CM

## 2018-09-09 DIAGNOSIS — G43909 Migraine, unspecified, not intractable, without status migrainosus: Secondary | ICD-10-CM | POA: Insufficient documentation

## 2018-09-09 MED ORDER — TESTOSTERONE CYPIONATE 200 MG/ML IM SOLN: 240.0000 mg | INTRAMUSCULAR | 1 refills | Status: DC

## 2018-09-09 MED ORDER — SUMATRIPTAN SUCCINATE 50 MG PO TABS: 50.0000 mg | ORAL_TABLET | ORAL | 12 refills | Status: DC | PRN

## 2018-09-09 MED ORDER — CLONAZEPAM 0.5 MG PO TABS: 0.5000 mg | ORAL_TABLET | Freq: Two times a day (BID) | ORAL | 0 refills | Status: DC | PRN

## 2018-09-09 NOTE — Progress Notes (Signed)
Justin Ashley is a 43 y.o. male who presents to [place]: [place] today for a routine follow-up. Patient reports difficulty sleeping due to stress and excessive workload from his job. Patient only sleeps around 4 hours a night and is tired all the time.  Patient also complained of migraines with visual disturbances occurring ~monthly.  Patient has not started Chantix or tried to stop smoking due to excessive stress.  Additionally he continues to take testosterone for hypogonadism.  He tolerates the medication well and notes that it helps him function.   Patient notes his blood pressures typically much better.  He notes his systolic is usually in the 120s. ROS as above:  Exam:  BP (!) 148/91   Pulse 91   Ht 5\' 8"  (1.727 m)   Wt 179 lb (81.2 kg)   SpO2 100%   BMI 27.22 kg/m  Wt Readings from Last 5 Encounters:  09/09/18 179 lb (81.2 kg)  05/12/18 174 lb (78.9 kg)  03/10/18 176 lb (79.8 kg)  02/13/17 183 lb (83 kg)  02/06/17 181 lb (82.1 kg)    Gen: Well NAD HEENT: EOMI,  MMM Lungs: Normal work of breathing. CTABL Heart: RRR no MRG Abd: NABS, Soft. Nondistended, Nontender Exts: Brisk capillary refill, warm and well perfused.  Psych: Alert and oriented normal speech thought process and affect.  Depression screen [facility] 2/9 09/09/2018 03/10/2018 02/13/2017  Decreased Interest 0 0 0  Down, Depressed, Hopeless 0 0 0  PHQ - 2 Score 0 0 0  Altered sleeping 3 - -  Tired, decreased energy 3 - -  Change in appetite 0 - -  Feeling bad or failure about yourself  0 - -  Trouble concentrating 0 - -  Moving slowly or fidgety/restless 1 - -  Suicidal thoughts 0 - -  PHQ-9 Score 7 - -  Difficult doing work/chores Not difficult at all - -   GAD 7 : Generalized Anxiety Score 09/09/2018  Nervous, Anxious, on Edge 1  Control/stop worrying 1  Worry too much - different things 1   Trouble relaxing 2  Restless 0  Easily annoyed or irritable 0  Afraid - awful might happen 0  Total GAD 7 Score 5  Anxiety Difficulty Not difficult at all     Lab and Radiology Results    Chemistry      Component Value Date/Time   NA 141 05/19/2018 0932   K 4.3 05/19/2018 0932   CL 105 05/19/2018 0932   CO2 26 05/19/2018 0932   BUN 10 05/19/2018 0932   CREATININE 1.07 05/19/2018 0932      Component Value Date/Time   CALCIUM 9.9 05/19/2018 0932   ALKPHOS 52 10/29/2016 1006   AST 12 05/19/2018 0932   ALT 14 05/19/2018 0932   BILITOT 0.6 05/19/2018 0932     Lab Results  Component Value Date   WBC 6.0 05/19/2018   HGB 14.6 05/19/2018   HCT 44.0 05/19/2018   MCV 87.1 05/19/2018   PLT 454 (H) 05/19/2018   Lab Results  Component Value Date   TESTOSTERONE 597 05/19/2018      Assessment and Plan: 43 y.o. male with Hx of panic attacks, hypogonadism, and smoking presents for routine follow-up. Patient complains of only being able to sleep ~4 hours a night due to a combination of racing thoughts and business of work. Recommended counseling to manage stress.  Patient experiences migraines ~ once a month. Prescribed  50 MG Sumatriptan as abortive treatment.  Hypogonadism: Doing well.  Continue current regimen.  Check labs in about 3 months.  Recheck in person in about 6 months.  Blood pressure elevated today.  Typically better controlled at home.  Watchful waiting.  PDMP reviewed during this encounter. No orders of the defined types were placed in this encounter.  Meds ordered this encounter  Medications  . SUMAtriptan (IMITREX) 50 MG tablet    Sig: Take 1 tablet (50 mg total) by mouth every 2 (two) hours as needed for migraine. May repeat in 2 hours if headache persists or recurs.    Dispense:  9 tablet    Refill:  12  . clonazePAM (KLONOPIN) 0.5 MG tablet    Sig: Take 1 tablet (0.5 mg total) by mouth 2 (two) times daily as needed for anxiety.    Dispense:  30  tablet    Refill:  0  . testosterone cypionate (DEPOTESTOSTERONE CYPIONATE) 200 MG/ML injection    Sig: Inject 1.2 mLs (240 mg total) into the muscle every 14 (fourteen) days.    Dispense:  12 mL    Refill:  1    Patient will have to waste the 0.8 mL of the second bottle as this is not a reusable bottle     Historical information moved to improve visibility of documentation.  Past Medical History:  Diagnosis Date  . Dislocation of metatarsophalangeal joint of right great toe   . Erectile dysfunction 12/24/2012   BCBS will not cover Cialis   . Essential hypertension 06/30/2014  . History of thrombocytosis 03/09/2013  . Hypogonadism in male 12/15/2013   Recheck therapy January 2016   . Pneumonia age 43  . Suicide attempt ([place]) 12/10/2013   No past surgical history on file. Social History   Tobacco Use  . Smoking status: Former Smoker    Years: 22.00    Types: Cigarettes    Quit date: 03/12/2009    Years since quitting: 9.5  . Smokeless tobacco: Never Used  Substance Use Topics  . Alcohol use: No   family history includes Cancer in his father and mother.  Medications: Current Outpatient Medications  Medication Sig Dispense Refill  . AMBULATORY NON FORMULARY MEDICATION BD 31mL syringe and BD 22G 1 and 1/2" needle, use to inject testosterone every two weeks.  BD 18G 1 and 1/2" needle used to draw up testosterone. Dx: Hypogonadism 20 Units 11  . AMBULATORY NON FORMULARY MEDICATION ProAir RespiClick.  Inhale two puffs every four hours only as needed for shortness of breath or wheezing. Use savings voucher. 1 Inhaler 0  . clonazePAM (KLONOPIN) 0.5 MG tablet Take 1 tablet (0.5 mg total) by mouth 2 (two) times daily as needed for anxiety. 30 tablet 0  . diclofenac (VOLTAREN) 0.1 % ophthalmic solution Place 2 drops into the right eye 4 (four) times daily. 5 mL 0  . fluticasone (FLONASE) 50 MCG/ACT nasal spray INHALE 1 SPRAY IN EACH NOSTRIL TWICE DAILY( USE LEFT HAND FOR RIGHT NOSTRIL AND  RIGHT HAND FOR LEFT NOSTRIL) 48 g 0  . losartan (COZAAR) 50 MG tablet TAKE 1 TABLET BY MOUTH ONCE DAILY 90 tablet 3  . tadalafil (ADCIRCA/CIALIS) 20 MG tablet Take 0.5-1 tablets (10-20 mg total) by mouth every other day as needed for erectile dysfunction. 30 tablet 11  . testosterone cypionate (DEPOTESTOSTERONE CYPIONATE) 200 MG/ML injection Inject 1.2 mLs (240 mg total) into the muscle every 14 (fourteen) days. 12 mL 1  . SUMAtriptan (IMITREX) 50 MG  tablet Take 1 tablet (50 mg total) by mouth every 2 (two) hours as needed for migraine. May repeat in 2 hours if headache persists or recurs. 9 tablet 12   No current facility-administered medications for this visit.    Allergies  Allergen Reactions  . Hydrocodone-Acetaminophen   . Lisinopril     Cough  . Oxycodone-Acetaminophen   . Zoloft [Sertraline Hcl]     Suicide attempt     Discussed warning signs or symptoms. Please see discharge instructions. Patient expresses understanding.  I personally was present and performed or re-performed the history, physical exam and medical decision-making activities of this service and have verified that the service and findings are accurately documented in the student's note. ___________________________________________ Lynne Leader M.D., ABFM., CAQSM. Primary Care and Sports Medicine Adjunct Instructor of [place] of [facility] [employer]

## 2018-09-09 NOTE — Patient Instructions (Addendum)
Thank you for coming in today. Continue current medicines.  We will be due for lab recheck in September. I will remind you when labs are due.   Get flu vaccine this fall.   Keep me updated.  Recheck in 6 months or sooner if needed for blood pressure and mood.    Keep track of anxiety.  Can do therapy.  Can also consider other medicines.

## 2018-09-29 ENCOUNTER — Other Ambulatory Visit: Payer: Self-pay | Admitting: Family Medicine

## 2018-12-19 ENCOUNTER — Other Ambulatory Visit: Payer: Self-pay

## 2018-12-19 ENCOUNTER — Emergency Department
Admission: EM | Admit: 2018-12-19 | Discharge: 2018-12-19 | Disposition: A | Discharge status: Home / Self Care | Payer: BC Managed Care – PPO | Source: Home / Self Care | Attending: Family Medicine | Admitting: Family Medicine

## 2018-12-19 DIAGNOSIS — J069 Acute upper respiratory infection, unspecified: Secondary | ICD-10-CM

## 2018-12-19 DIAGNOSIS — B9789 Other viral agents as the cause of diseases classified elsewhere: Secondary | ICD-10-CM | POA: Diagnosis not present

## 2018-12-19 DIAGNOSIS — J029 Acute pharyngitis, unspecified: Secondary | ICD-10-CM

## 2018-12-19 DIAGNOSIS — R05 Cough: Secondary | ICD-10-CM

## 2018-12-19 DIAGNOSIS — Z20828 Contact with and (suspected) exposure to other viral communicable diseases: Secondary | ICD-10-CM

## 2018-12-19 LAB — POC SARS CORONAVIRUS 2 AG -  ED: SARS Coronavirus 2 Ag: NEGATIVE

## 2018-12-19 NOTE — ED Provider Notes (Signed)
[name]    CSN: [id number] Arrival date & time: 12/19/18  1729      History   Chief Complaint Chief Complaint  Patient presents with  . possible covid exposure    HPI Justin Ashley is a 43 y.o. male.   Patient complains of three day history of typical cold-like symptoms developing over several days, including mild sore throat, sinus congestion, fatigue, and cough.  He denies pleuritic pain, chest tightness, fevers, chills, and sweats, and changes in taste/smell. He is concerned about a possible COVID19 infection because a coworker recently tested positive.  The history is provided by the patient.    Past Medical History:  Diagnosis Date  . Dislocation of metatarsophalangeal joint of right great toe   . Erectile dysfunction 12/24/2012   BCBS will not cover Cialis   . Essential hypertension 06/30/2014  . History of thrombocytosis 03/09/2013  . Hypogonadism in male 12/15/2013   Recheck therapy January 2016   . Pneumonia age 68  . Suicide attempt ([place]) 12/10/2013    Patient Active Problem List   Diagnosis Date Noted  . Migraine 09/09/2018  . H. pylori infection 05/03/2016  . Dyspepsia 05/02/2016  . Palpitations 11/28/2015  . Panic attacks 06/14/2015  . Nicotine vapor product user 10/11/2014  . Essential hypertension 06/30/2014  . Hypogonadism in male 12/15/2013  . History of suicide attempt 12/10/2013  . Degenerative disc disease, cervical 03/09/2013  . History of thrombocytosis 03/09/2013  . Erectile dysfunction 12/24/2012    History reviewed. No pertinent surgical history.     Home Medications    Prior to Admission medications   Medication Sig Start Date End Date Taking? Authorizing Provider  AMBULATORY NON FORMULARY MEDICATION BD 45mL syringe and BD 22G 1 and 1/2" needle, use to inject testosterone every two weeks.  BD 18G 1 and 1/2" needle used to draw up testosterone. Dx: Hypogonadism 06/30/14   Marcial Pacas, DO  AMBULATORY NON FORMULARY  MEDICATION ProAir RespiClick.  Inhale two puffs every four hours only as needed for shortness of breath or wheezing. Use savings voucher. 10/25/14   Hommel, Hilliard Clark, DO  clonazePAM (KLONOPIN) 0.5 MG tablet Take 1 tablet (0.5 mg total) by mouth 2 (two) times daily as needed for anxiety. 09/09/18   Gregor Hams, MD  diclofenac (VOLTAREN) 0.1 % ophthalmic solution Place 2 drops into the right eye 4 (four) times daily. 05/12/18   Gregor Hams, MD  fluticasone (FLONASE) 50 MCG/ACT nasal spray INHALE 1 SPRAY IN EACH NOSTRIL TWICE DAILY( USE LEFT HAND FOR RIGHT NOSTRIL AND RIGHT HAND FOR LEFT NOSTRIL) 02/12/18   Gregor Hams, MD  losartan (COZAAR) 50 MG tablet TAKE 1 TABLET BY MOUTH ONCE DAILY 09/29/18   Silverio Decamp, MD  SUMAtriptan (IMITREX) 50 MG tablet Take 1 tablet (50 mg total) by mouth every 2 (two) hours as needed for migraine. May repeat in 2 hours if headache persists or recurs. 09/09/18   Gregor Hams, MD  tadalafil (ADCIRCA/CIALIS) 20 MG tablet Take 0.5-1 tablets (10-20 mg total) by mouth every other day as needed for erectile dysfunction. 03/10/18   Gregor Hams, MD  testosterone cypionate (DEPOTESTOSTERONE CYPIONATE) 200 MG/ML injection Inject 1.2 mLs (240 mg total) into the muscle every 14 (fourteen) days. 09/09/18   Gregor Hams, MD    Family History Family History  Problem Relation Age of Onset  . Cancer Mother        lung  . Cancer Father  stomach, deceased @  55yrs    Social History Social History   Tobacco Use  . Smoking status: Former Smoker    Years: 22.00    Types: Cigarettes    Quit date: 03/12/2009    Years since quitting: 9.7  . Smokeless tobacco: Never Used  Substance Use Topics  . Alcohol use: No  . Drug use: No     Allergies   Hydrocodone-acetaminophen, Lisinopril, Oxycodone-acetaminophen, and Zoloft [sertraline hcl]   Review of Systems Review of Systems + sore throat + cough No pleuritic pain No wheezing + nasal congestion + post-nasal  drainage No sinus pain/pressure No itchy/red eyes No earache No hemoptysis No SOB No fever/chills No nausea No vomiting No abdominal pain No diarrhea No urinary symptoms No skin rash + fatigue No myalgias No headache   Physical Exam Triage Vital Signs ED Triage Vitals  Enc Vitals Group     BP 12/19/18 1848 (!) 160/72     Pulse Rate 12/19/18 1848 93     Resp 12/19/18 1848 20     Temp 12/19/18 1848 98.5 F (36.9 C)     Temp src --      SpO2 12/19/18 1848 99 %     Weight 12/19/18 1851 175 lb (79.4 kg)     Height 12/19/18 1851 5\' 8"  (1.727 m)     Head Circumference --      Peak Flow --      Pain Score 12/19/18 1851 0     Pain Loc --      Pain Edu? --      Excl. in [place]? --    No data found.  Updated Vital Signs BP (!) 160/72 (BP Location: Right Arm)   Pulse 93   Temp 98.5 F (36.9 C)   Resp 20   Ht 5\' 8"  (1.727 m)   Wt 79.4 kg   SpO2 99%   BMI 26.61 kg/m   Visual Acuity Right Eye Distance:   Left Eye Distance:   Bilateral Distance:    Right Eye Near:   Left Eye Near:    Bilateral Near:     Physical Exam Nursing notes and Vital Signs reviewed. Appearance:  Patient appears stated age, and in no acute distress Eyes:  Pupils are equal, round, and reactive to light and accomodation.  Extraocular movement is intact.  Conjunctivae are not inflamed  Ears:  Canals normal.  Tympanic membranes normal.  Nose:  Mildly congested turbinates.  No sinus tenderness.   Pharynx:  Normal Neck:  Supple.  Enlarged posterior/lateral nodes are palpated bilaterally, tender to palpation on the left.   Lungs:  Clear to auscultation.  Breath sounds are equal.  Moving air well. Heart:  Regular rate and rhythm without murmurs, rubs, or gallops.  Abdomen:  Nontender without masses or hepatosplenomegaly.  Bowel sounds are present.  No CVA or flank tenderness.  Extremities:  No edema.  Skin:  No rash present.    UC Treatments / Results  Labs (all labs ordered are listed, but only  abnormal results are displayed) Labs Reviewed  POC SARS CORONAVIRUS 2 ED negative    EKG   Radiology No results found.  Procedures Procedures (including critical care time)  Medications Ordered in UC Medications - No data to display  Initial Impression / Assessment and Plan / UC Course  I have reviewed the triage vital signs and the nursing notes.  Pertinent labs & imaging results that were available during my care of the patient  were reviewed by me and considered in my medical decision making (see chart for details).    There is no evidence of bacterial infection today.  Treat symptomatically for now.  Final Clinical Impressions(s) / UC Diagnoses   Final diagnoses:  Viral URI     Discharge Instructions     Take plain guaifenesin (1200mg  extended release tabs such as Mucinex) twice daily, with plenty of water, for cough and congestion.  May add Pseudoephedrine (30mg , one or two every 4 to 6 hours) for sinus congestion.  Get adequate rest.   May use Afrin nasal spray (or generic oxymetazoline) each morning for about 5 days and then discontinue.  Also recommend using saline nasal spray several times daily and saline nasal irrigation (AYR is a common brand).  Continue Flonase nasal spray each morning after using Afrin nasal spray and saline nasal irrigation. Try warm salt water gargles for sore throat.  Stop all antihistamines for now, and other non-prescription cough/cold preparations. May take Delsym Cough Suppressant at bedtime for nighttime cough.     ED Prescriptions    None        Kandra Nicolas, MD 12/25/18 1227

## 2018-12-19 NOTE — Discharge Instructions (Addendum)
Take plain guaifenesin (1200mg  extended release tabs such as Mucinex) twice daily, with plenty of water, for cough and congestion.  May add Pseudoephedrine (30mg , one or two every 4 to 6 hours) for sinus congestion.  Get adequate rest.   May use Afrin nasal spray (or generic oxymetazoline) each morning for about 5 days and then discontinue.  Also recommend using saline nasal spray several times daily and saline nasal irrigation (AYR is a common brand).  Continue Flonase nasal spray each morning after using Afrin nasal spray and saline nasal irrigation. Try warm salt water gargles for sore throat.  Stop all antihistamines for now, and other non-prescription cough/cold preparations. May take Delsym Cough Suppressant at bedtime for nighttime cough.

## 2018-12-19 NOTE — ED Triage Notes (Signed)
Pt has a scratchy throat, nasal congestion, cough.  Has had a covid exposure 2 weeks ago.

## 2019-01-08 ENCOUNTER — Ambulatory Visit: Payer: BC Managed Care – PPO | Admitting: Family Medicine

## 2019-01-09 ENCOUNTER — Encounter: Payer: Self-pay | Admitting: Family Medicine

## 2019-01-09 ENCOUNTER — Ambulatory Visit (INDEPENDENT_AMBULATORY_CARE_PROVIDER_SITE_OTHER): Payer: BC Managed Care – PPO | Admitting: Family Medicine

## 2019-01-09 ENCOUNTER — Other Ambulatory Visit: Payer: Self-pay

## 2019-01-09 VITALS — BP 112/71 | HR 93 | Temp 98.2°F | Wt 178.0 lb

## 2019-01-09 DIAGNOSIS — Z5181 Encounter for therapeutic drug level monitoring: Secondary | ICD-10-CM

## 2019-01-09 DIAGNOSIS — Z862 Personal history of diseases of the blood and blood-forming organs and certain disorders involving the immune mechanism: Secondary | ICD-10-CM | POA: Diagnosis not present

## 2019-01-09 DIAGNOSIS — Z23 Encounter for immunization: Secondary | ICD-10-CM | POA: Diagnosis not present

## 2019-01-09 DIAGNOSIS — R7303 Prediabetes: Secondary | ICD-10-CM | POA: Diagnosis not present

## 2019-01-09 DIAGNOSIS — E291 Testicular hypofunction: Secondary | ICD-10-CM

## 2019-01-09 DIAGNOSIS — I10 Essential (primary) hypertension: Secondary | ICD-10-CM | POA: Diagnosis not present

## 2019-01-09 MED ORDER — LOSARTAN POTASSIUM 50 MG PO TABS: 50.0000 mg | ORAL_TABLET | Freq: Every day | ORAL | 1 refills | Status: DC

## 2019-01-09 MED ORDER — TESTOSTERONE CYPIONATE 200 MG/ML IM SOLN: 240.0000 mg | INTRAMUSCULAR | 1 refills | Status: DC

## 2019-01-09 NOTE — Progress Notes (Signed)
Justin Ashley is a 43 y.o. male who presents to [facility] [place] today for follow-up hypertension testosterone anxiety.  Also discussed continued abdominal pain.  HTN: Doing well with losartan.  No chest pain palpitation shortness of breath.  Mood: Doing well.  Took 3 tablets of the Klonopin in the last 6 months.  Feels like anxiety is pretty well controlled..  Testosterone: Tolerating mid testosterone well.  He feels like it helps give him energy and improved libido.    Around December of last year Justin Ashley fell landing on his right side.  He injured his chest wall and abdominal musculature.  He had initial evaluation with rib x-ray that was negative.  He notes he continues to have a bit of pain in his right abdominal chest wall and right flank.  He notes this is worse with sitting up from a seated position.  He denies nausea vomiting diarrhea or blood in the stool or urine.  He feels well with no other issues.  He notes is not bad enough to do physical therapy or further imaging at this time but wants to make sure we think about it released.  ROS as above:  Exam:  BP 112/71   Pulse 93   Temp 98.2 F (36.8 C) (Oral)   Wt 178 lb (80.7 kg)   BMI 27.06 kg/m  Wt Readings from Last 5 Encounters:  01/09/19 178 lb (80.7 kg)  12/19/18 175 lb (79.4 kg)  09/09/18 179 lb (81.2 kg)  05/12/18 174 lb (78.9 kg)  03/10/18 176 lb (79.8 kg)    Gen: Well NAD HEENT: EOMI,  MMM Lungs: Normal work of breathing. CTABL Heart: RRR no MRG Abd: NABS, Soft. Nondistended, Nontender  Exts: Brisk capillary refill, warm and well perfused.   Depression screen [facility] 2/9 01/09/2019 09/09/2018 03/10/2018 02/13/2017  Decreased Interest 0 0 0 0  Down, Depressed, Hopeless 0 0 0 0  PHQ - 2 Score 0 0 0 0  Altered sleeping 0 3 - -  Tired, decreased energy 0 3 - -  Change in appetite 0 0 - -  Feeling bad  or failure about yourself  0 0 - -  Trouble concentrating 0 0 - -  Moving slowly or fidgety/restless 0 1 - -  Suicidal thoughts 0 0 - -  PHQ-9 Score 0 7 - -  Difficult doing work/chores Not difficult at all Not difficult at all - -   GAD 7 : Generalized Anxiety Score 01/09/2019 09/09/2018  Nervous, Anxious, on Edge 0 1  Control/stop worrying 0 1  Worry too much - different things 0 1  Trouble relaxing 0 2  Restless 0 0  Easily annoyed or irritable 0 0  Afraid - awful might happen 0 0  Total GAD 7 Score 0 5  Anxiety Difficulty Not difficult at all Not difficult at all       Assessment and Plan: 43 y.o. male with  Hypertension doing well continue current regimen.  Check basic fasting labs.  Testosterone: Doing well check testosterone CBC PSA for testosterone safety monitoring.  Plan on recheck in about 6 months for this as well.  Mood: Doing well continue current regimen.  Abdominal pain: Likely muscle injury.  Will be able to check on this a bit with the labs listed below.  However if needed further treatment CT scan of the abdomen pelvis is probably a good neck step.  However would likely proceed with trial  of physical therapy first.  Patient will let us know if he wishes to proceed.  Also will check hemoglobin A1c given history of prediabetes with labs.  Flu vaccine given today prior to discharge.  Recheck 6 months new PCP.  PDMP not reviewed this encounter. Orders Placed This Encounter  Procedures  . Flu Vaccine QUAD 6+ mos PF IM (Fluarix Quad PF)  . CBC  . COMPLETE METABOLIC PANEL WITH GFR  . PSA  . Testosterone  . Lipid Panel w/reflex Direct LDL  . Hemoglobin A1c   Meds ordered this encounter  Medications  . losartan (COZAAR) 50 MG tablet    Sig: Take 1 tablet (50 mg total) by mouth daily.    Dispense:  90 tablet    Refill:  1  . testosterone cypionate (DEPOTESTOSTERONE CYPIONATE) 200 MG/ML injection    Sig: Inject 1.2 mLs (240 mg total) into the muscle  every 14 (fourteen) days.    Dispense:  12 mL    Refill:  1    Patient will have to waste the 0.8 mL of the second bottle as this is not a reusable bottle     Historical information moved to improve visibility of documentation.  Past Medical History:  Diagnosis Date  . Dislocation of metatarsophalangeal joint of right great toe   . Erectile dysfunction 12/24/2012   BCBS will not cover Cialis   . Essential hypertension 06/30/2014  . History of thrombocytosis 03/09/2013  . Hypogonadism in male 12/15/2013   Recheck therapy January 2016   . Pneumonia age 20  . Suicide attempt ([place]) 12/10/2013   No past surgical history on file. Social History   Tobacco Use  . Smoking status: Former Smoker    Years: 22.00    Types: Cigarettes    Quit date: 03/12/2009    Years since quitting: 9.8  . Smokeless tobacco: Never Used  Substance Use Topics  . Alcohol use: No   family history includes Cancer in his father and mother.  Medications: Current Outpatient Medications  Medication Sig Dispense Refill  . AMBULATORY NON FORMULARY MEDICATION BD 105mL syringe and BD 22G 1 and 1/2" needle, use to inject testosterone every two weeks.  BD 18G 1 and 1/2" needle used to draw up testosterone. Dx: Hypogonadism 20 Units 11  . AMBULATORY NON FORMULARY MEDICATION ProAir RespiClick.  Inhale two puffs every four hours only as needed for shortness of breath or wheezing. Use savings voucher. 1 Inhaler 0  . clonazePAM (KLONOPIN) 0.5 MG tablet Take 1 tablet (0.5 mg total) by mouth 2 (two) times daily as needed for anxiety. 30 tablet 0  . diclofenac (VOLTAREN) 0.1 % ophthalmic solution Place 2 drops into the right eye 4 (four) times daily. 5 mL 0  . fluticasone (FLONASE) 50 MCG/ACT nasal spray INHALE 1 SPRAY IN EACH NOSTRIL TWICE DAILY( USE LEFT HAND FOR RIGHT NOSTRIL AND RIGHT HAND FOR LEFT NOSTRIL) 48 g 0  . losartan (COZAAR) 50 MG tablet Take 1 tablet (50 mg total) by mouth daily. 90 tablet 1  . SUMAtriptan (IMITREX)  50 MG tablet Take 1 tablet (50 mg total) by mouth every 2 (two) hours as needed for migraine. May repeat in 2 hours if headache persists or recurs. 9 tablet 12  . tadalafil (ADCIRCA/CIALIS) 20 MG tablet Take 0.5-1 tablets (10-20 mg total) by mouth every other day as needed for erectile dysfunction. 30 tablet 11  . testosterone cypionate (DEPOTESTOSTERONE CYPIONATE) 200 MG/ML injection Inject 1.2 mLs (240 mg total) into the  muscle every 14 (fourteen) days. 12 mL 1   No current facility-administered medications for this visit.    Allergies  Allergen Reactions  . Hydrocodone-Acetaminophen   . Lisinopril     Cough  . Oxycodone-Acetaminophen   . Zoloft [Sertraline Hcl]     Suicide attempt     Discussed warning signs or symptoms. Please see discharge instructions. Patient expresses understanding.

## 2019-01-09 NOTE — Patient Instructions (Addendum)
Thank you for coming in today.  Continue current medicine.  If the pain is bad enough ok to either do CT scan or PT Let us know.   Recheck with Dr Zigmund Daniel in 6 months   I will be moving to full time Sports Medicine in [place] starting on November 2nd  You will still be able to see me for your Sports Medicine or Orthopedic needs at [facility] in [place]. I will still be part of [facility].    If you want to stay locally for your Sports Medicine issues Dr. Dianah Field here in [place] will be happy to see you.  Additionally Dr. Clearance Coots at [facility] will be happy to see you for sports medicine issues more locally.   For your primary care needs you are welcome to establish care with Dr. Emeterio Reeve.  Dr Luetta Nutting (Starting in February) will be starting in the new year and a new NP Joy (Starting in December).  We are working quickly to hire more physicians to cover the primary care needs however if you cannot get an appointment with Dr. Sheppard Coil in a timely manner [facility] has locations and openings for primary care services nearby.   [facility] at [facility] [address] . [employer] , [place]: [phone] . Behavioral Medicine: [phone] . Fax: [place] at [employer] [address] . [place], [place]: [phone] . Behavioral Medicine: [phone] . Fax: [phone] . Hours (M-F): 7am - Academic librarian At [facility]. [place], [place]: [phone] . Behavioral Medicine: [phone] . Fax: [phone] . Hours (M-F): 8am - Optician, dispensing at [employer] . [place], [place] Phone: [phone] . Behavioral Medicine: [phone] . Fax: [phone]

## 2019-01-11 ENCOUNTER — Telehealth: Payer: Self-pay | Admitting: Family Medicine

## 2019-01-11 DIAGNOSIS — E291 Testicular hypofunction: Secondary | ICD-10-CM

## 2019-01-11 NOTE — Telephone Encounter (Signed)
Justin Ashley called me this morning.  He developed dizzy sensation starting this morning. He describes a room spinning sensation with motion. The episodes last for 30 seconds. Has been ongoing all this morning. Tried naproxen.  No headache.  He had similar episodes last years.  I discussed warning signs and symptoms.   Suspect BPPV.  Plan Meclizine and follow up in the near future if not better.  Go to ER if worse.

## 2019-01-12 DIAGNOSIS — I10 Essential (primary) hypertension: Secondary | ICD-10-CM | POA: Diagnosis not present

## 2019-01-12 DIAGNOSIS — E291 Testicular hypofunction: Secondary | ICD-10-CM | POA: Diagnosis not present

## 2019-01-12 DIAGNOSIS — Z862 Personal history of diseases of the blood and blood-forming organs and certain disorders involving the immune mechanism: Secondary | ICD-10-CM | POA: Diagnosis not present

## 2019-01-12 DIAGNOSIS — R7303 Prediabetes: Secondary | ICD-10-CM | POA: Diagnosis not present

## 2019-01-13 ENCOUNTER — Telehealth: Payer: Self-pay | Admitting: Sports Medicine

## 2019-01-13 ENCOUNTER — Encounter: Payer: Self-pay | Admitting: Family Medicine

## 2019-01-13 DIAGNOSIS — E291 Testicular hypofunction: Secondary | ICD-10-CM

## 2019-01-13 LAB — CBC
HCT: 40.1 % (ref 38.5–50.0)
Hemoglobin: 12.9 g/dL — ABNORMAL LOW (ref 13.2–17.1)
MCH: 27.1 pg (ref 27.0–33.0)
MCHC: 32.2 g/dL (ref 32.0–36.0)
MCV: 84.2 fL (ref 80.0–100.0)
MPV: 9.3 fL (ref 7.5–12.5)
Platelets: 442 10*3/uL — ABNORMAL HIGH (ref 140–400)
RBC: 4.76 10*6/uL (ref 4.20–5.80)
RDW: 13.3 % (ref 11.0–15.0)
WBC: 4.5 10*3/uL (ref 3.8–10.8)

## 2019-01-13 LAB — HEMOGLOBIN A1C
Hgb A1c MFr Bld: 5.6 % of total Hgb (ref <5.7)
Mean Plasma Glucose: 114 (calc)
eAG (mmol/L): 6.3 (calc)

## 2019-01-13 LAB — COMPLETE METABOLIC PANEL WITH GFR
AG Ratio: 1.7 (calc) (ref 1.0–2.5)
ALT: 27 U/L (ref 9–46)
AST: 14 U/L (ref 10–40)
Albumin: 4.3 g/dL (ref 3.6–5.1)
Alkaline phosphatase (APISO): 56 U/L (ref 36–130)
BUN: 11 mg/dL (ref 7–25)
CO2: 24 mmol/L (ref 20–32)
Calcium: 9.3 mg/dL (ref 8.6–10.3)
Chloride: 108 mmol/L (ref 98–110)
Creat: 1.02 mg/dL (ref 0.60–1.35)
GFR, Est African American: 102 mL/min/{1.73_m2} (ref >=60)
GFR, Est Non African American: 88 mL/min/{1.73_m2} (ref >=60)
Globulin: 2.6 g/dL (calc) (ref 1.9–3.7)
Glucose, Bld: 102 mg/dL — ABNORMAL HIGH (ref 65–99)
Potassium: 4.7 mmol/L (ref 3.5–5.3)
Sodium: 141 mmol/L (ref 135–146)
Total Bilirubin: 0.4 mg/dL (ref 0.2–1.2)
Total Protein: 6.9 g/dL (ref 6.1–8.1)

## 2019-01-13 LAB — LIPID PANEL W/REFLEX DIRECT LDL
Cholesterol: 203 mg/dL — ABNORMAL HIGH (ref <200)
HDL: 42 mg/dL (ref >=40)
LDL Cholesterol (Calc): 135 mg/dL (calc) — ABNORMAL HIGH
Non-HDL Cholesterol (Calc): 161 mg/dL (calc) — ABNORMAL HIGH (ref <130)
Total CHOL/HDL Ratio: 4.8 (calc) (ref <5.0)
Triglycerides: 134 mg/dL (ref <150)

## 2019-01-13 LAB — PSA: PSA: 1.5 ng/mL (ref <=4.0)

## 2019-01-13 LAB — TESTOSTERONE: Testosterone: 344 ng/dL (ref 250–827)

## 2019-01-13 MED ORDER — TESTOSTERONE CYPIONATE 200 MG/ML IM SOLN: 300.0000 mg | INTRAMUSCULAR | 1 refills | Status: DC

## 2019-01-13 NOTE — Telephone Encounter (Signed)
Received fax for prior authorization on testosterone sent through cover my meds waiting on determination. - CF

## 2019-01-13 NOTE — Telephone Encounter (Signed)
Hypogonadism in male Still feeling fatigue, increasing testosterone to 1.5 mL / 300 mg intramuscular every 2 weeks. Recheck testosterone levels, CBC exactly 1 week after third injection.

## 2019-01-13 NOTE — Addendum Note (Signed)
Addended by: Silverio Decamp on: 01/13/2019 01:54 PM   Modules accepted: Orders

## 2019-01-13 NOTE — Assessment & Plan Note (Signed)
Still feeling fatigue, increasing testosterone to 1.5 mL / 300 mg intramuscular every 2 weeks. Recheck testosterone levels, CBC exactly 1 week after third injection.

## 2019-01-16 ENCOUNTER — Other Ambulatory Visit: Payer: Self-pay | Admitting: Sports Medicine

## 2019-01-16 DIAGNOSIS — E291 Testicular hypofunction: Secondary | ICD-10-CM

## 2019-01-16 MED ORDER — TESTOSTERONE CYPIONATE 200 MG/ML IM SOLN: 300.0000 mg | INTRAMUSCULAR | 3 refills | Status: DC

## 2019-01-16 NOTE — Telephone Encounter (Signed)
I have sent it with a good Rx coupon to [facility] with a large 10 mL vial, it should only be about $40 and this should last him a couple of months.

## 2019-01-16 NOTE — Telephone Encounter (Signed)
Received fax from [facility] they denied coverage on testosterone due to patient has to have two low levels collected  In the morning - CF

## 2019-01-16 NOTE — Assessment & Plan Note (Signed)
He was still feeling fatigue so we increased testosterone to 1.5 mL / 300 mg intramuscular every 2 weeks. The plan was to check testosterone levels and a CBC exactly 1 week after the third injection. It sounds as though it is not covered however it is cheap with a good Rx coupon at 1 of several pharmacies.  I would like him to just let me know which pharmacy he would like me to send the prescription and a coupon to.

## 2019-02-23 DIAGNOSIS — E291 Testicular hypofunction: Secondary | ICD-10-CM | POA: Diagnosis not present

## 2019-02-27 LAB — CBC
HCT: 39.6 % (ref 38.5–50.0)
Hemoglobin: 12.7 g/dL — ABNORMAL LOW (ref 13.2–17.1)
MCH: 27.3 pg (ref 27.0–33.0)
MCHC: 32.1 g/dL (ref 32.0–36.0)
MCV: 85 fL (ref 80.0–100.0)
MPV: 9.4 fL (ref 7.5–12.5)
Platelets: 488 10*3/uL — ABNORMAL HIGH (ref 140–400)
RBC: 4.66 10*6/uL (ref 4.20–5.80)
RDW: 14 % (ref 11.0–15.0)
WBC: 4.9 10*3/uL (ref 3.8–10.8)

## 2019-02-27 LAB — TESTOSTERONE, FREE & TOTAL
Free Testosterone: 168.9 pg/mL — ABNORMAL HIGH (ref 35.0–155.0)
Testosterone, Total, LC-MS-MS: 605 ng/dL (ref 250–1100)

## 2019-03-11 ENCOUNTER — Ambulatory Visit: Payer: BC Managed Care – PPO | Admitting: Family Medicine

## 2019-06-21 ENCOUNTER — Encounter: Payer: Self-pay | Admitting: Family Medicine

## 2019-07-09 ENCOUNTER — Ambulatory Visit (INDEPENDENT_AMBULATORY_CARE_PROVIDER_SITE_OTHER): Payer: BC Managed Care – PPO

## 2019-07-09 ENCOUNTER — Other Ambulatory Visit: Payer: Self-pay

## 2019-07-09 ENCOUNTER — Ambulatory Visit (INDEPENDENT_AMBULATORY_CARE_PROVIDER_SITE_OTHER): Payer: BC Managed Care – PPO | Admitting: Family Medicine

## 2019-07-09 ENCOUNTER — Encounter: Payer: Self-pay | Admitting: Family Medicine

## 2019-07-09 VITALS — BP 132/80 | HR 114 | Ht 68.11 in | Wt 186.0 lb

## 2019-07-09 DIAGNOSIS — F410 Panic disorder [episodic paroxysmal anxiety] without agoraphobia: Secondary | ICD-10-CM

## 2019-07-09 DIAGNOSIS — M545 Low back pain, unspecified: Secondary | ICD-10-CM

## 2019-07-09 DIAGNOSIS — E291 Testicular hypofunction: Secondary | ICD-10-CM

## 2019-07-09 DIAGNOSIS — I10 Essential (primary) hypertension: Secondary | ICD-10-CM

## 2019-07-09 IMAGING — DX DG LUMBAR SPINE COMPLETE 4+V
5 series · 5 of 5 positions shown · non-contrast
Comparison: None.

CLINICAL DATA: Back pain, remote history of fall, pain in the
midline lower back discontinuing since following 1.5 years ago.

EXAM:
LUMBAR SPINE - COMPLETE 4+ VIEW

[l-spine ap]
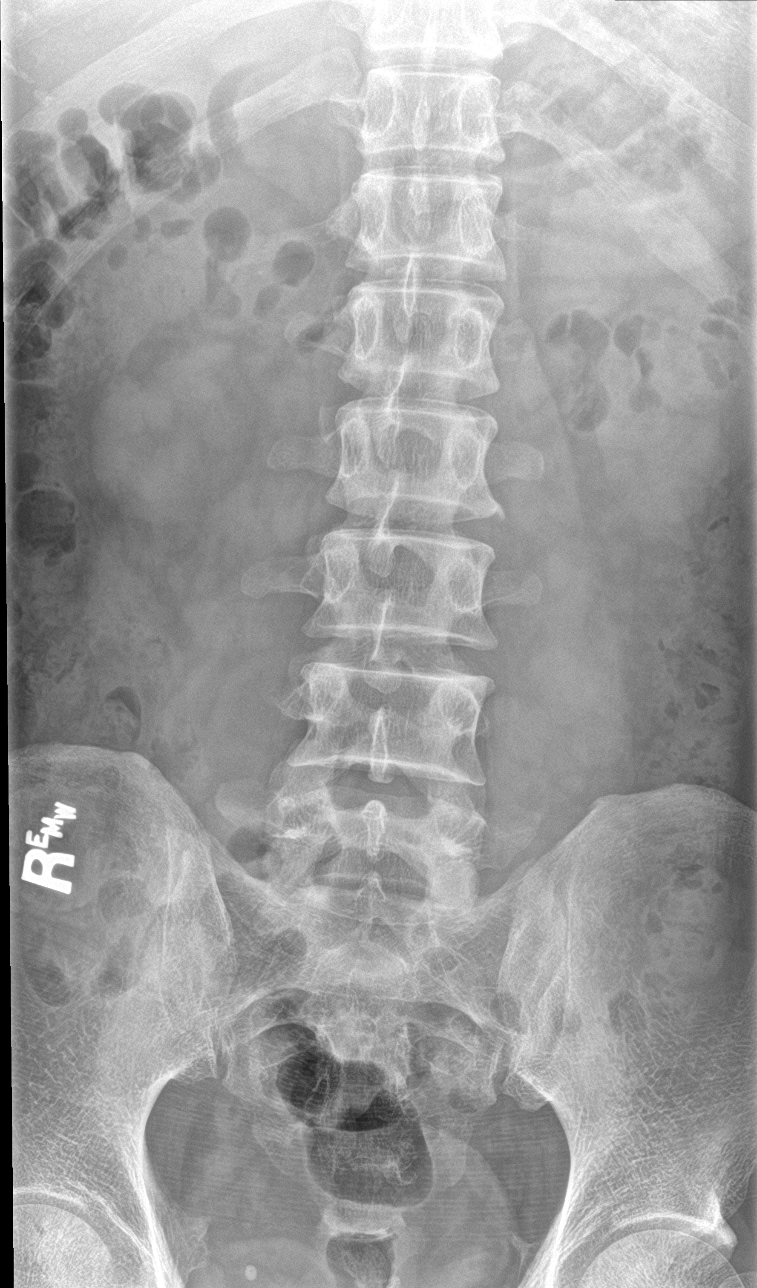

[l-spine obl (1 of 2)]
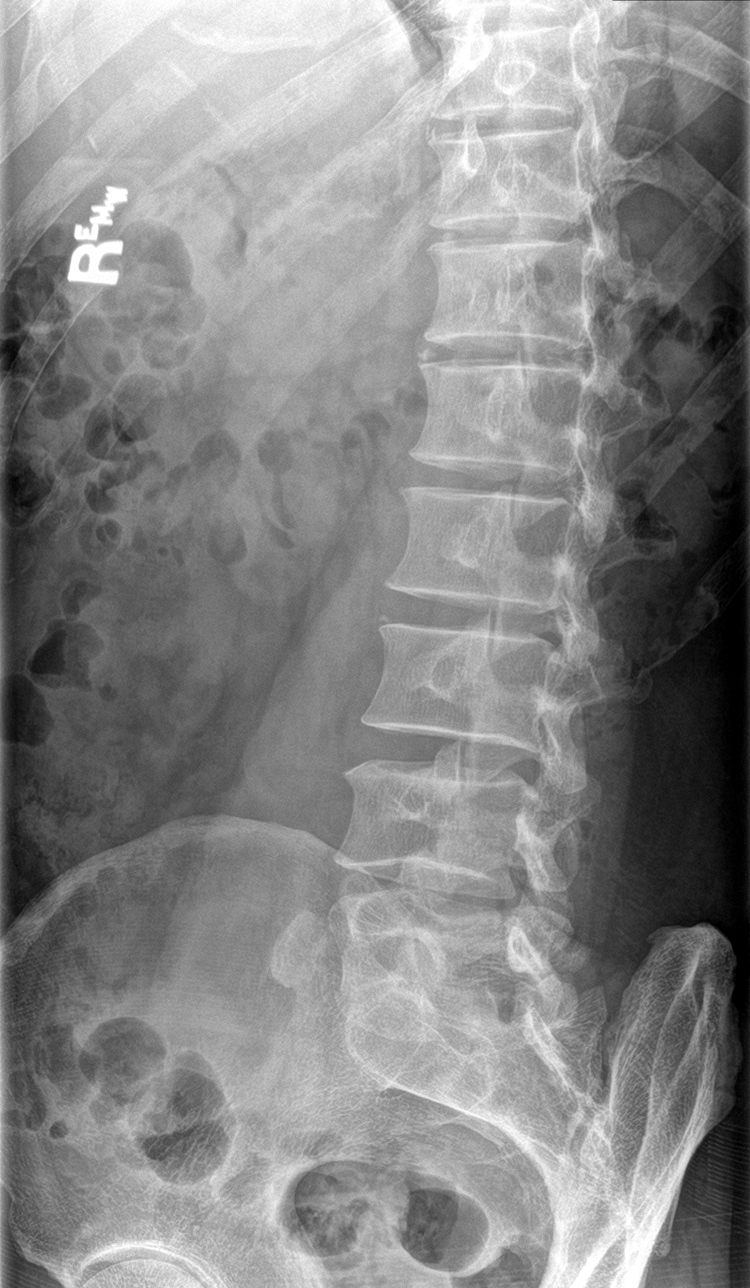

[l-spine obl (2 of 2)]
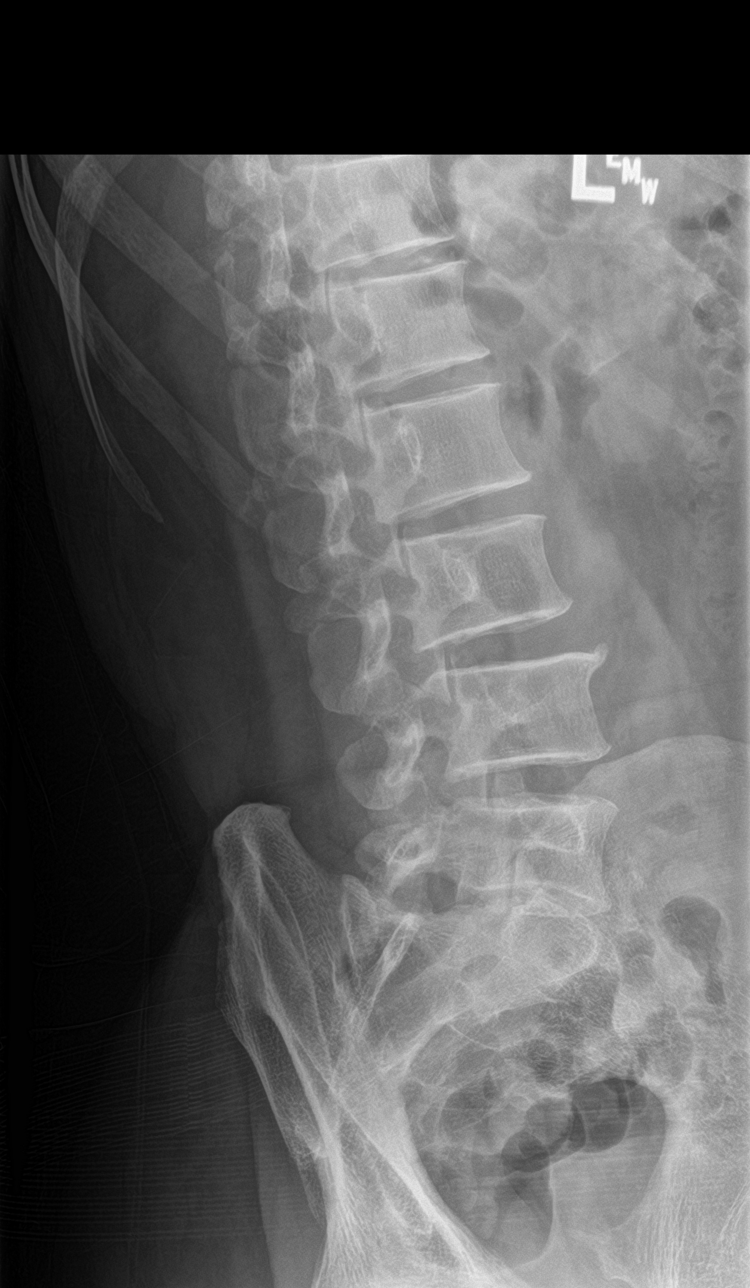

[l-spine lat]
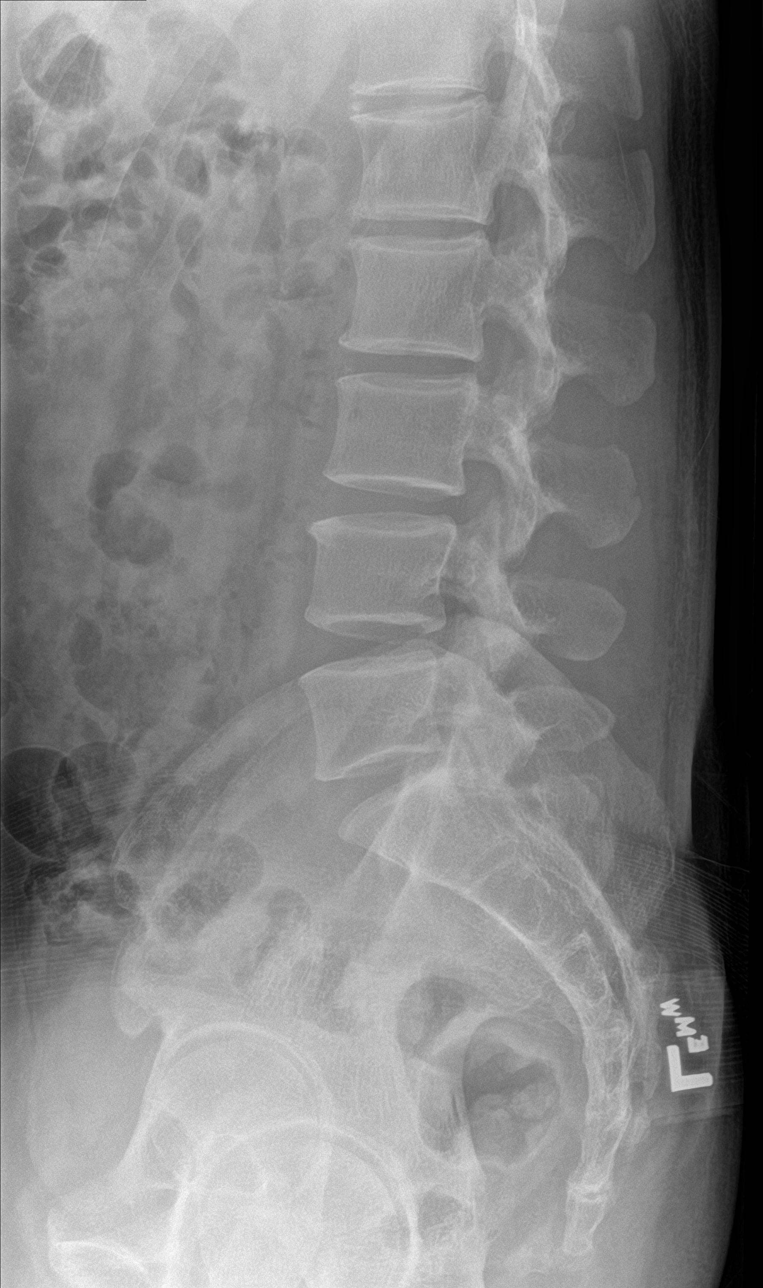

[l-spine spot]
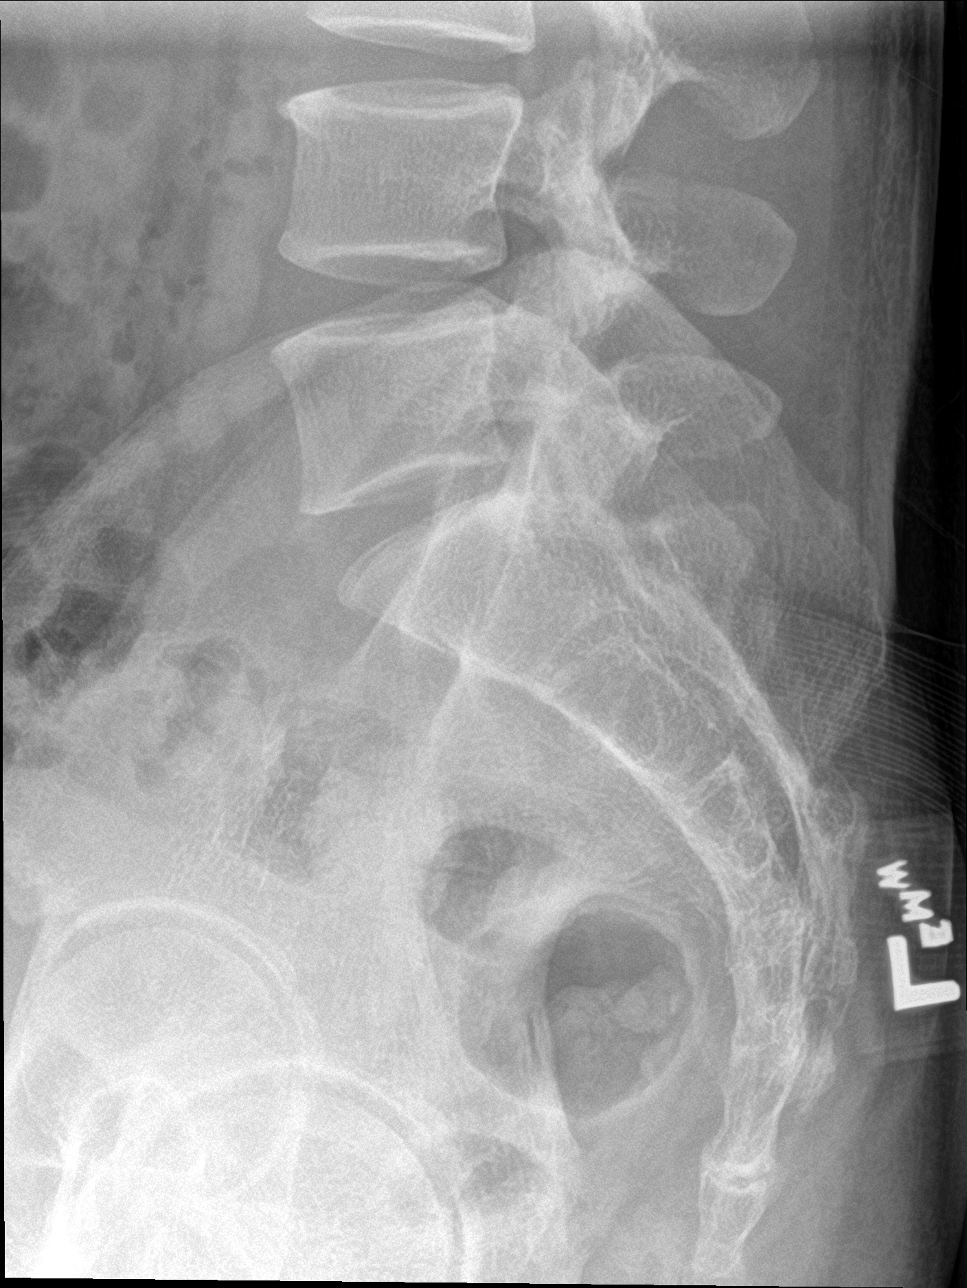

[5 of 5 positions shown; findings below may reference images not displayed]

FINDINGS: No evidence of lumbar spine fracture.

Mild facet arthropathy and mild degenerative changes at L5-S1. Mild
disc space narrowing at T12-L1 and L1-L2. Mild endplate degenerative
changes in the lower lumbar spine.

Calcification projecting over the RIGHT iliac wing approximately 2 x
1.4 cm seen on the oblique view as well.
IMPRESSION: Mild degenerative changes of the lumbar spine.

Ovoid calcification projecting over RIGHT sacroiliac region is of
uncertain significance, potentially in the fecal stream. Correlate
with any RIGHT-sided symptoms with CT as warranted for further
evaluation.

## 2019-07-09 MED ORDER — SUMATRIPTAN SUCCINATE 50 MG PO TABS: 50.0000 mg | ORAL_TABLET | ORAL | 12 refills | Status: DC | PRN

## 2019-07-09 MED ORDER — TESTOSTERONE CYPIONATE 200 MG/ML IM SOLN: 300.0000 mg | INTRAMUSCULAR | 3 refills | Status: DC

## 2019-07-09 MED ORDER — LOSARTAN POTASSIUM 50 MG PO TABS: 50.0000 mg | ORAL_TABLET | Freq: Every day | ORAL | 1 refills | Status: DC

## 2019-07-09 NOTE — Patient Instructions (Signed)
Great to meet you today! We'll be in touch with lab and xray results.

## 2019-07-09 NOTE — Assessment & Plan Note (Signed)
Doing well with current therapy.  Update testosterone and CBC

## 2019-07-09 NOTE — Assessment & Plan Note (Signed)
Blood pressure is at goal at for age and co-morbidities.  I recommend he continue losartan at current strength.  In addition they were instructed to follow a low sodium diet with regular exercise to help to maintain adequate control of blood pressure.

## 2019-07-09 NOTE — Progress Notes (Signed)
Justin Ashley - 44 y.o. male MRN [id number]  Date of birth: [birth year]  Subjective Chief Complaint  Patient presents with  . Hypogonadism    HPI Justin Ashley is a 44 y.o. male with history of HTN, anxiety with panic attacks, low testosterone and migraines here today for follow up.    -HTN:   Managed with losartan.  BP readings at home and when donating blood have been normal.  Denies symptoms related to HTN including chest pain, shortness of breath, palpitations, headache or vision changes.   -Low testosterone:  Treated with 300mg  testosterone q2 weeks.  He is concerned because previous levels were much lower than last time.  He reports he feels ok with current dosing.    -Anxiety:  Mainly manifests in the form of panic attacks.  Has rx for clonazepam as needed.  This works well for him.    -Back pain:  Reports having a fall while skate boarding about 1.5 years ago.  Had rib pain as well as low back pain.  Pain in ribs has improved but he continues to have pain in low back.  States that he feels there is something that just isn't right.  He denies radiation of pain.    ROS:  A comprehensive ROS was completed and negative except as noted per HPI  Allergies  Allergen Reactions  . Hydrocodone-Acetaminophen   . Lisinopril     Cough  . Oxycodone-Acetaminophen   . Zoloft [Sertraline Hcl]     Suicide attempt    Past Medical History:  Diagnosis Date  . Dislocation of metatarsophalangeal joint of right great toe   . Erectile dysfunction 12/24/2012   BCBS will not cover Cialis   . Essential hypertension 06/30/2014  . History of thrombocytosis 03/09/2013  . Hypogonadism in male 12/15/2013   Recheck therapy January 2016   . Pneumonia age 20  . Suicide attempt ([place]) 12/10/2013    No past surgical history on file.  Social History   Socioeconomic History  . Marital status: Single    Spouse name: Not on file  . Number of children: Not on file  . Years of education: Not on file  .  Highest education level: Not on file  Occupational History  . Not on file  Tobacco Use  . Smoking status: Former Smoker    Years: 22.00    Types: Cigarettes    Quit date: 03/12/2009    Years since quitting: 10.3  . Smokeless tobacco: Never Used  Substance and Sexual Activity  . Alcohol use: No  . Drug use: No  . Sexual activity: Yes  Other Topics Concern  . Not on file  Social History Narrative  . Not on file   Social Determinants of Health   Financial Resource Strain:   . Difficulty of Paying Living Expenses:   Food Insecurity:   . Worried About Charity fundraiser in the Last Year:   . Arboriculturist in the Last Year:   Transportation Needs:   . Film/video editor (Medical):   Marland Kitchen Lack of Transportation (Non-Medical):   Physical Activity:   . Days of Exercise per Week:   . Minutes of Exercise per Session:   Stress:   . Feeling of Stress :   Social Connections:   . Frequency of Communication with Friends and Family:   . Frequency of Social Gatherings with Friends and Family:   . Attends Religious Services:   . Active Member of Clubs or Organizations:   .  Attends Archivist Meetings:   Marland Kitchen Marital Status:     Family History  Problem Relation Age of Onset  . Cancer Mother        lung  . Cancer Father        stomach, deceased @  62yrs    Health Maintenance  Topic Date Due  . COVID-19 Vaccine (2 - Moderna 2-dose series) 07/27/2019  . INFLUENZA VACCINE  10/11/2019  . TETANUS/TDAP  05/02/2026  . HIV Screening  Completed     ----------------------------------------------------------------------------------------------------------------------------------------------------------------------------------------------------------------- Physical Exam BP 132/80   Pulse (!) 114   Ht 5' 8.11" (1.73 m)   Wt 186 lb (84.4 kg)   SpO2 97%   BMI 28.19 kg/m   Physical Exam Constitutional:      Appearance: Normal appearance.  HENT:     Head: Normocephalic  and atraumatic.     Mouth/Throat:     Mouth: Mucous membranes are moist.  Eyes:     General: No scleral icterus. Cardiovascular:     Rate and Rhythm: Normal rate and regular rhythm.  Pulmonary:     Effort: Pulmonary effort is normal.     Breath sounds: Normal breath sounds.  Skin:    General: Skin is warm and dry.  Neurological:     General: No focal deficit present.     Mental Status: He is alert.  Psychiatric:        Mood and Affect: Mood normal.        Behavior: Behavior normal.     ------------------------------------------------------------------------------------------------------------------------------------------------------------------------------------------------------------------- Assessment and Plan  Essential hypertension Blood pressure is at goal at for age and co-morbidities.  I recommend he continue losartan at current strength.  In addition they were instructed to follow a low sodium diet with regular exercise to help to maintain adequate control of blood pressure.    Hypogonadism in male Doing well with current therapy.  Update testosterone and CBC  Panic attacks Stable, has clonazepam as needed.   Midline low back pain without sciatica Xrays of lumbar spine ordered.    Meds ordered this encounter  Medications  . losartan (COZAAR) 50 MG tablet    Sig: Take 1 tablet (50 mg total) by mouth daily.    Dispense:  90 tablet    Refill:  1  . SUMAtriptan (IMITREX) 50 MG tablet    Sig: Take 1 tablet (50 mg total) by mouth every 2 (two) hours as needed for migraine. May repeat in 2 hours if headache persists or recurs.    Dispense:  9 tablet    Refill:  12  . testosterone cypionate (DEPOTESTOSTERONE CYPIONATE) 200 MG/ML injection    Sig: Inject 1.5 mLs (300 mg total) into the muscle every 14 (fourteen) days.    Dispense:  10 mL    Refill:  3    Member [id number]  [id number]    Return in about 6 months (around 01/08/2020) for  HTN/Low T.    This visit occurred during the SARS-CoV-2 public health emergency.  Safety protocols were in place, including screening questions prior to the visit, additional usage of staff PPE, and extensive cleaning of exam room while observing appropriate contact time as indicated for disinfecting solutions.

## 2019-07-09 NOTE — Assessment & Plan Note (Signed)
Stable, has clonazepam as needed.

## 2019-07-09 NOTE — Assessment & Plan Note (Signed)
X-rays of lumbar spine ordered. 

## 2019-07-10 ENCOUNTER — Other Ambulatory Visit: Payer: Self-pay | Admitting: Family Medicine

## 2019-07-13 ENCOUNTER — Encounter: Payer: Self-pay | Admitting: Family Medicine

## 2019-07-13 DIAGNOSIS — E291 Testicular hypofunction: Secondary | ICD-10-CM | POA: Diagnosis not present

## 2019-07-13 DIAGNOSIS — I10 Essential (primary) hypertension: Secondary | ICD-10-CM | POA: Diagnosis not present

## 2019-07-14 ENCOUNTER — Encounter: Payer: Self-pay | Admitting: Family Medicine

## 2019-07-14 ENCOUNTER — Other Ambulatory Visit: Payer: Self-pay | Admitting: Family Medicine

## 2019-07-14 DIAGNOSIS — M545 Low back pain, unspecified: Secondary | ICD-10-CM

## 2019-07-14 DIAGNOSIS — R937 Abnormal findings on diagnostic imaging of other parts of musculoskeletal system: Secondary | ICD-10-CM

## 2019-07-14 LAB — BASIC METABOLIC PANEL
BUN: 10 mg/dL (ref 7–25)
CO2: 28 mmol/L (ref 20–32)
Calcium: 9.4 mg/dL (ref 8.6–10.3)
Chloride: 107 mmol/L (ref 98–110)
Creat: 1.03 mg/dL (ref 0.60–1.35)
Glucose, Bld: 105 mg/dL — ABNORMAL HIGH (ref 65–99)
Potassium: 4.3 mmol/L (ref 3.5–5.3)
Sodium: 140 mmol/L (ref 135–146)

## 2019-07-14 LAB — CBC
HCT: 39.8 % (ref 38.5–50.0)
Hemoglobin: 12.5 g/dL — ABNORMAL LOW (ref 13.2–17.1)
MCH: 25 pg — ABNORMAL LOW (ref 27.0–33.0)
MCHC: 31.4 g/dL — ABNORMAL LOW (ref 32.0–36.0)
MCV: 79.4 fL — ABNORMAL LOW (ref 80.0–100.0)
MPV: 9.2 fL (ref 7.5–12.5)
Platelets: 476 10*3/uL — ABNORMAL HIGH (ref 140–400)
RBC: 5.01 10*6/uL (ref 4.20–5.80)
RDW: 14.3 % (ref 11.0–15.0)
WBC: 5.4 10*3/uL (ref 3.8–10.8)

## 2019-07-14 LAB — TESTOSTERONE: Testosterone: 758 ng/dL (ref 250–827)

## 2019-07-15 ENCOUNTER — Ambulatory Visit (INDEPENDENT_AMBULATORY_CARE_PROVIDER_SITE_OTHER): Payer: BC Managed Care – PPO

## 2019-07-15 ENCOUNTER — Other Ambulatory Visit: Payer: Self-pay

## 2019-07-15 DIAGNOSIS — M545 Low back pain, unspecified: Secondary | ICD-10-CM

## 2019-07-15 DIAGNOSIS — R937 Abnormal findings on diagnostic imaging of other parts of musculoskeletal system: Secondary | ICD-10-CM

## 2019-07-15 IMAGING — CT CT L SPINE W/O CM
3 series · 12 of 33 positions shown, 14 images · non-contrast
Comparison: Lumbar radiographs [DATE]

CLINICAL DATA: Low back pain. Ovoid calcification overlying the
right sacroiliac joint on x-ray. History of fall

EXAM:
CT LUMBAR SPINE WITHOUT CONTRAST
TECHNIQUE: Multidetector CT imaging of the lumbar spine was performed without
intravenous contrast administration. Multiplanar CT image
reconstructions were also generated.

[Series 4: l spine soft · axial · 0.33mm/px · z∈[-314,-144]mm · 4 of 125 slices shown, 5 images]
[im 20/125  soft-tissue]
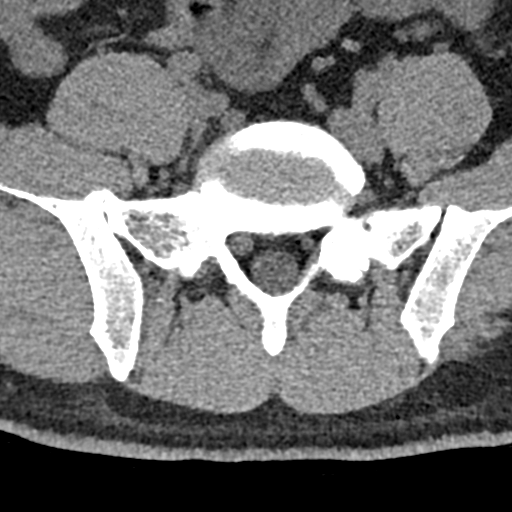
[im 20/125  bone]
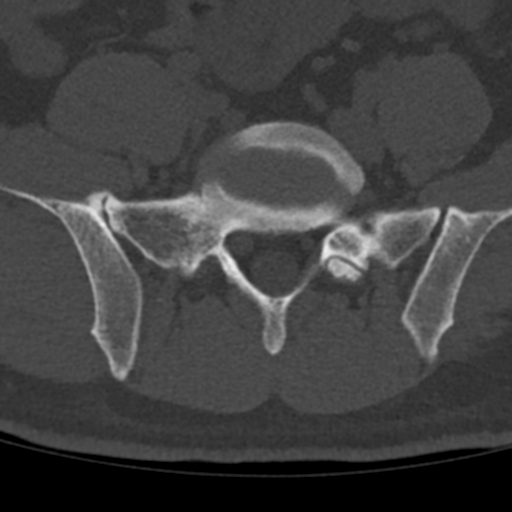
[im 48/125  bone]
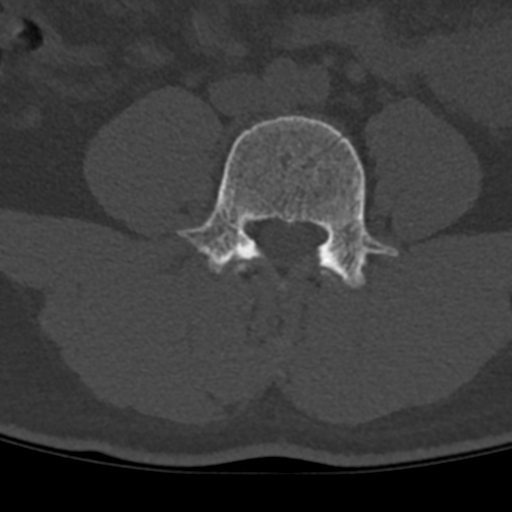
[im 77/125  bone]
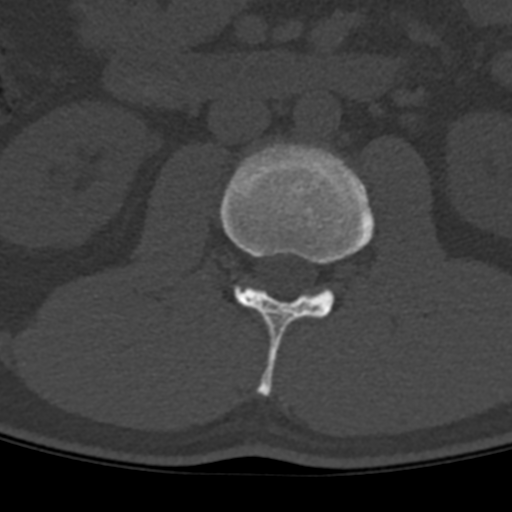
[im 105/125  bone]
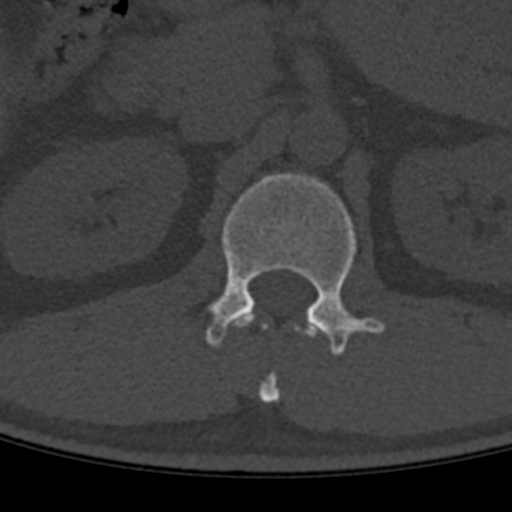

[Series 7: sagittal bone · sagittal · 0.32mm/px · 5 of 77 slices shown, 6 images]
[im 26/77  bone]
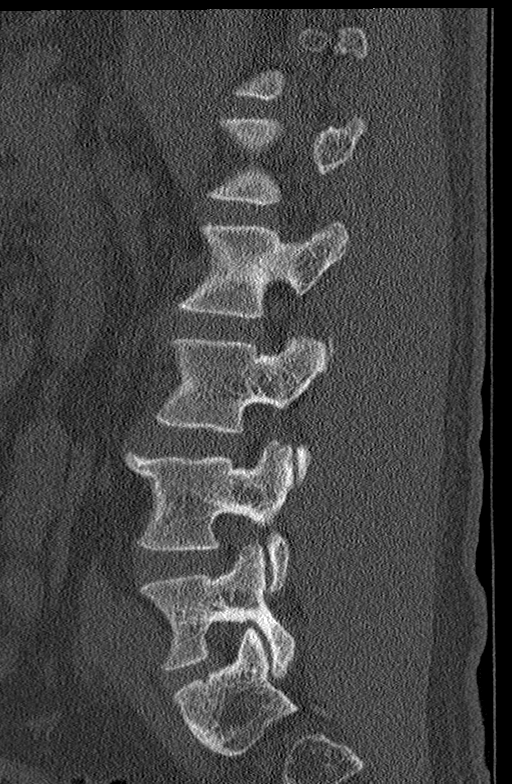
[im 32/77  bone]
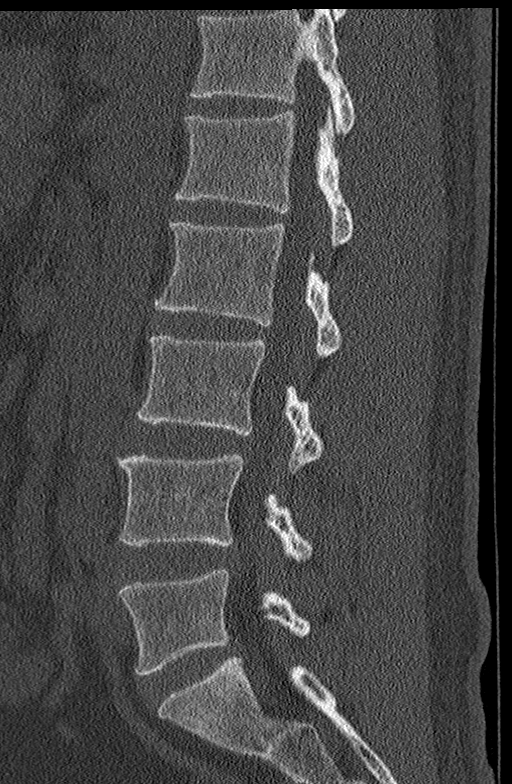
[im 39/77  soft-tissue]
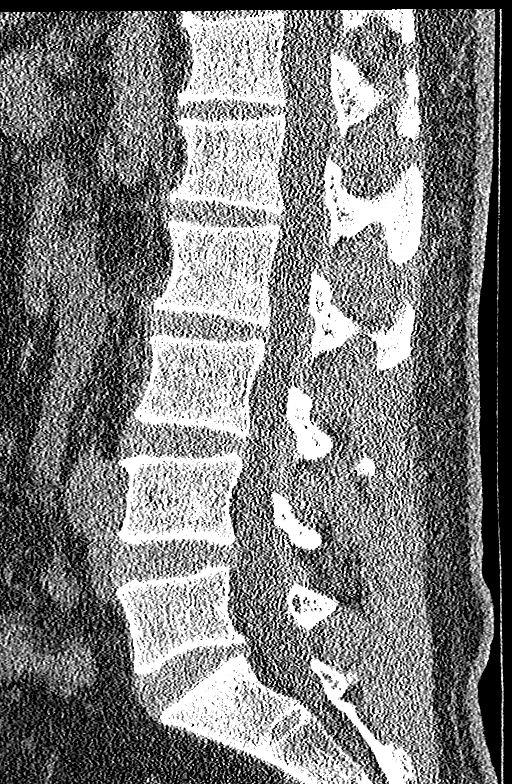
[im 39/77  bone]
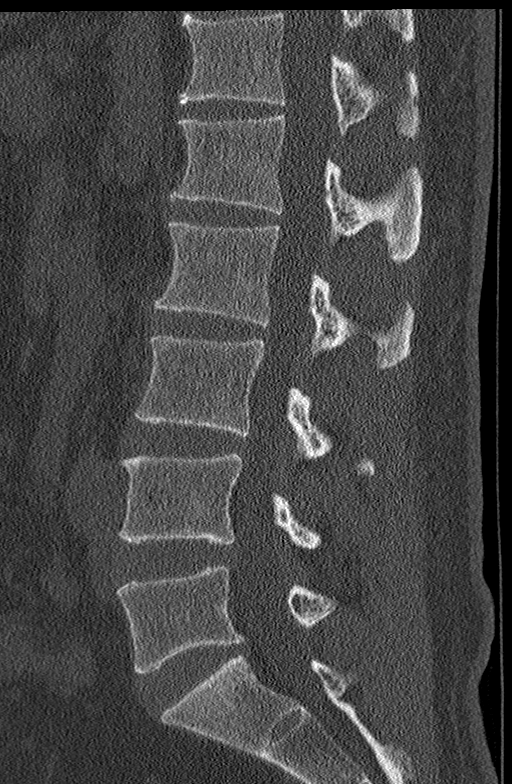
[im 45/77  bone]
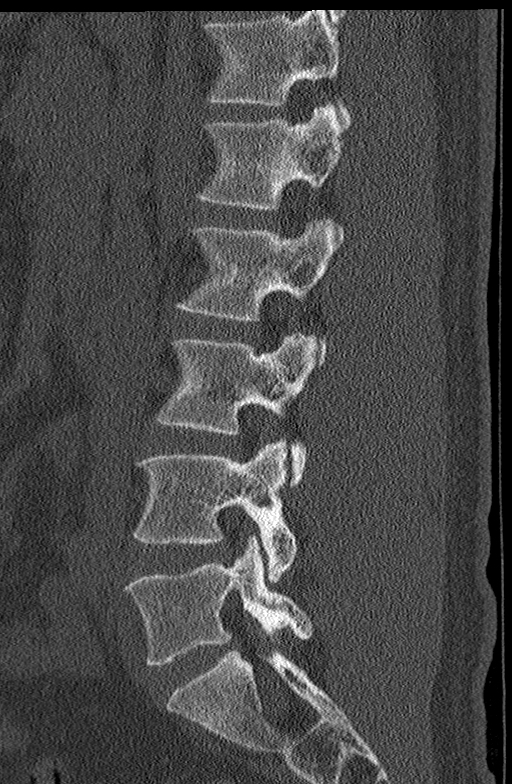
[im 51/77  bone]
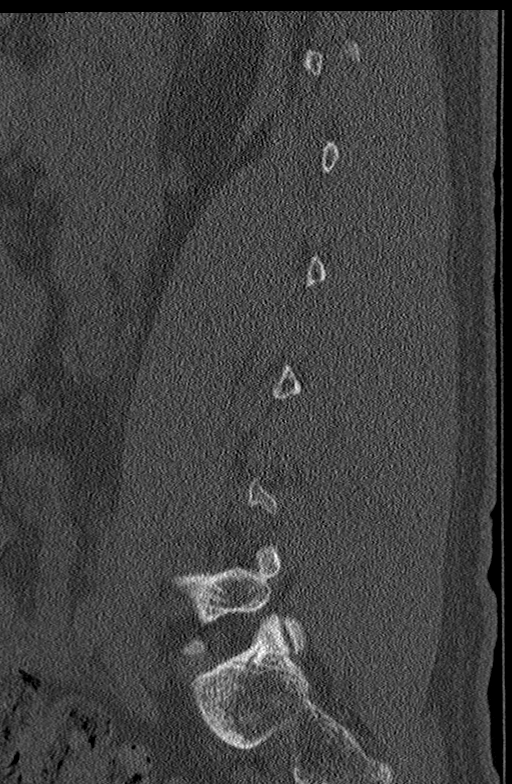

[Series 8: coronal bone · coronal · 0.28mm/px · 3 of 66 slices shown]
[im 14/66  bone]
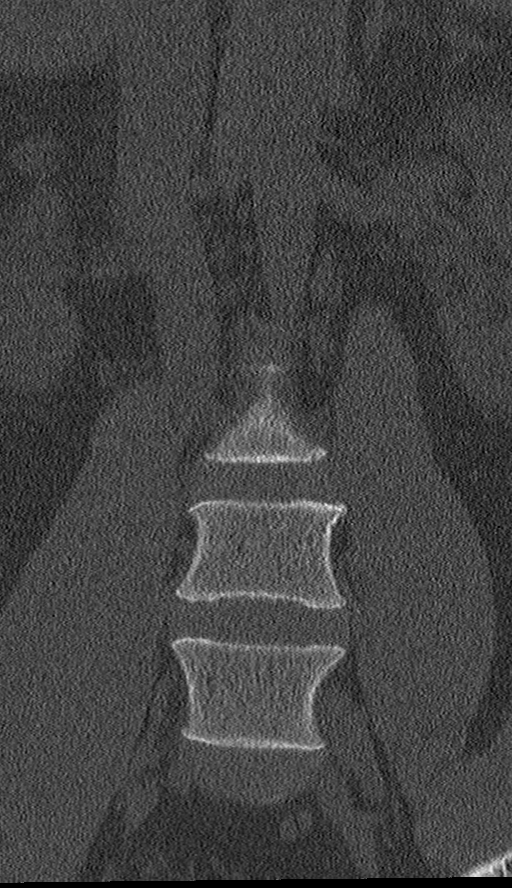
[im 27/66  bone]
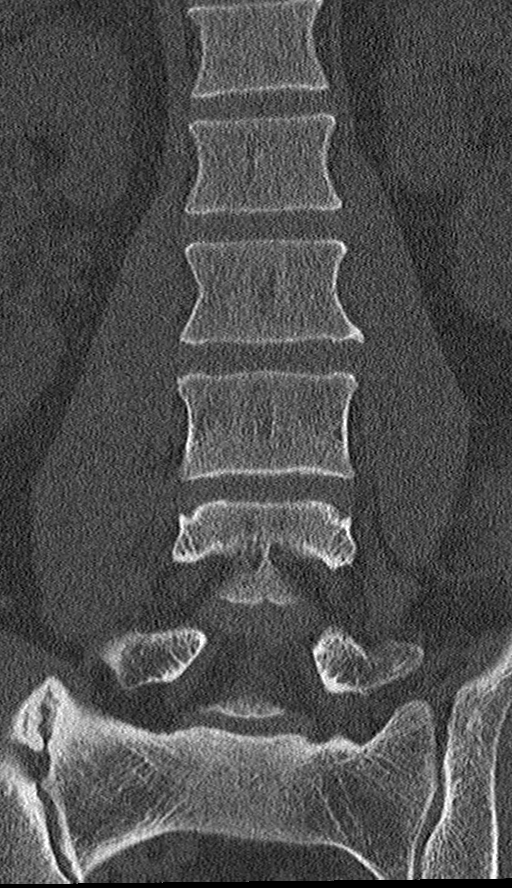
[im 40/66  bone]
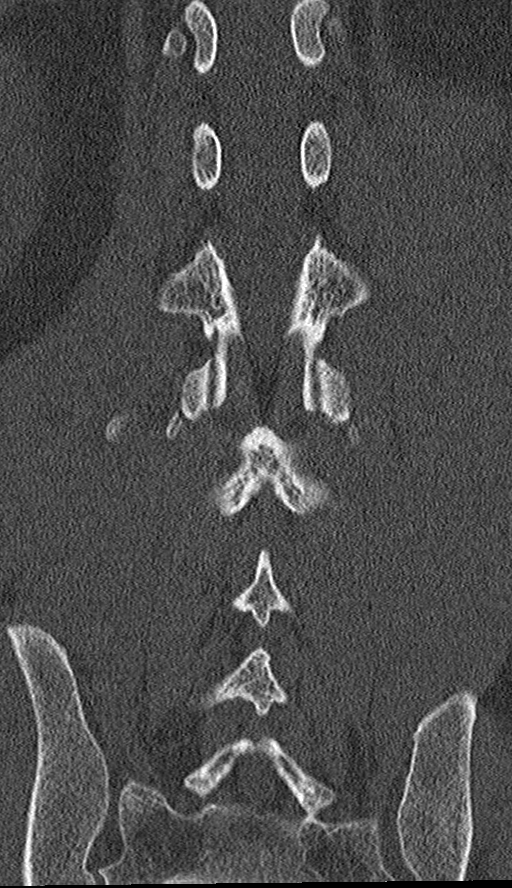

[12 of 33 positions shown; findings below may reference images not displayed]

FINDINGS: Segmentation: Normal.  Lowest disc space L5-S1

Alignment: Normal

Vertebrae: Negative for fracture or mass.

Degenerative osteophyte in the superior aspect of the right
sacroiliac joint corresponds to the x-ray finding. No bone lesion
identified.

Paraspinal and other soft tissues: Negative for paraspinous mass or
adenopathy. No hydronephrosis. Sigmoid diverticulosis.

Disc levels: L1-2: Mild disc and facet degeneration without disc
protrusion or stenosis

L2-3: Mild disc and mild facet degeneration. Negative for disc
protrusion or stenosis

L3-4: Mild disc and mild facet degeneration. Negative for disc
protrusion or stenosis.

L4-5: Mild disc bulging and mild facet degeneration. Negative for
disc protrusion or stenosis

L5-S1: Mild disc and facet degeneration without stenosis

Degenerative change in the superior aspect of right SI joint with
bridging osteophyte.
IMPRESSION: 1. Mild lumbar degenerative change.  No fracture or spinal stenosis
2. Degenerative change in the right SI joint with anterior bridging
osteophyte corresponding to the density on x-ray.

## 2019-07-16 ENCOUNTER — Other Ambulatory Visit: Payer: Self-pay | Admitting: Family Medicine

## 2019-07-16 DIAGNOSIS — K921 Melena: Secondary | ICD-10-CM

## 2019-07-16 DIAGNOSIS — D649 Anemia, unspecified: Secondary | ICD-10-CM | POA: Diagnosis not present

## 2019-07-16 DIAGNOSIS — Z800 Family history of malignant neoplasm of digestive organs: Secondary | ICD-10-CM

## 2019-07-16 LAB — IRON,TIBC AND FERRITIN PANEL
%SAT: 24 % (calc) (ref 20–48)
Ferritin: 6 ng/mL — ABNORMAL LOW (ref 38–380)
Iron: 106 ug/dL (ref 50–180)
TIBC: 450 mcg/dL (calc) — ABNORMAL HIGH (ref 250–425)

## 2019-07-17 ENCOUNTER — Encounter: Payer: Self-pay | Admitting: Gastroenterology

## 2019-07-20 ENCOUNTER — Other Ambulatory Visit: Payer: Self-pay

## 2019-07-20 ENCOUNTER — Ambulatory Visit (INDEPENDENT_AMBULATORY_CARE_PROVIDER_SITE_OTHER): Payer: BC Managed Care – PPO | Admitting: Family Medicine

## 2019-07-20 ENCOUNTER — Encounter: Payer: Self-pay | Admitting: Family Medicine

## 2019-07-20 DIAGNOSIS — M545 Low back pain, unspecified: Secondary | ICD-10-CM

## 2019-07-20 DIAGNOSIS — G43809 Other migraine, not intractable, without status migrainosus: Secondary | ICD-10-CM

## 2019-07-20 MED ORDER — UBRELVY 50 MG PO TABS: ORAL_TABLET | ORAL | 1 refills | Status: DC

## 2019-07-20 NOTE — Patient Instructions (Signed)
Let's try ubrelvy in place of imitrex.  Take at onset of migraine, you may repeat dose after 2 hours if needed.   I recommend scheduling with Dr. Darene Lamer to discuss back pain.   I'll keep an eye out for notes back from GI after your appointment.

## 2019-07-20 NOTE — Progress Notes (Signed)
Justin Ashley - 44 y.o. male MRN [id number]  Date of birth: Apr 21, 1975  Subjective Chief Complaint  Patient presents with  . Migraine    HPI Justin Ashley is a 44 y.o. male with history of migraines here today for follow up of migraines.  He reports migraines 2-3x per year.  Describes as headaches with dizziness and nausea.  Symptoms sometimes last 2-3 days.  He has been prescribed imitrex but hasn't really used after reading potential side effects.   He also continues to have back pain. Recent CT scan with degenerative change of SI joint with bridging osteophyte.  Using naproxen for pain control, somewhat helpful.   ROS:  A comprehensive ROS was completed and negative except as noted per HPI  Allergies  Allergen Reactions  . Hydrocodone-Acetaminophen   . Lisinopril     Cough  . Oxycodone-Acetaminophen   . Zoloft [Sertraline Hcl]     Suicide attempt    Past Medical History:  Diagnosis Date  . Dislocation of metatarsophalangeal joint of right great toe   . Erectile dysfunction 12/24/2012   BCBS will not cover Cialis   . Essential hypertension 06/30/2014  . History of thrombocytosis 03/09/2013  . Hypogonadism in male 12/15/2013   Recheck therapy January 2016   . Pneumonia age 78  . Suicide attempt ([place]) 12/10/2013    History reviewed. No pertinent surgical history.  Social History   Socioeconomic History  . Marital status: Single    Spouse name: Not on file  . Number of children: Not on file  . Years of education: Not on file  . Highest education level: Not on file  Occupational History  . Not on file  Tobacco Use  . Smoking status: Former Smoker    Years: 22.00    Types: Cigarettes    Quit date: 03/12/2009    Years since quitting: 10.3  . Smokeless tobacco: Never Used  Substance and Sexual Activity  . Alcohol use: No  . Drug use: No  . Sexual activity: Yes  Other Topics Concern  . Not on file  Social History Narrative  . Not on file   Social Determinants of  Health   Financial Resource Strain:   . Difficulty of Paying Living Expenses:   Food Insecurity:   . Worried About Charity fundraiser in the Last Year:   . Arboriculturist in the Last Year:   Transportation Needs:   . Film/video editor (Medical):   Marland Kitchen Lack of Transportation (Non-Medical):   Physical Activity:   . Days of Exercise per Week:   . Minutes of Exercise per Session:   Stress:   . Feeling of Stress :   Social Connections:   . Frequency of Communication with Friends and Family:   . Frequency of Social Gatherings with Friends and Family:   . Attends Religious Services:   . Active Member of Clubs or Organizations:   . Attends Archivist Meetings:   Marland Kitchen Marital Status:     Family History  Problem Relation Age of Onset  . Cancer Mother        lung  . Cancer Father        stomach, deceased @  5yrs    Health Maintenance  Topic Date Due  . INFLUENZA VACCINE  10/11/2019  . TETANUS/TDAP  05/02/2026  . COVID-19 Vaccine  Completed  . HIV Screening  Completed     ----------------------------------------------------------------------------------------------------------------------------------------------------------------------------------------------------------------- Physical Exam There were no vitals taken for this visit.  Physical Exam Constitutional:      Appearance: Normal appearance.  HENT:     Head: Normocephalic and atraumatic.  Cardiovascular:     Rate and Rhythm: Normal rate and regular rhythm.  Pulmonary:     Effort: Pulmonary effort is normal.     Breath sounds: Normal breath sounds.  Musculoskeletal:     Cervical back: Neck supple.  Neurological:     General: No focal deficit present.     Mental Status: He is alert.  Psychiatric:        Mood and Affect: Mood normal.        Behavior: Behavior normal.      ------------------------------------------------------------------------------------------------------------------------------------------------------------------------------------------------------------------- Assessment and Plan  Migraine Concerned about side effects related to imitrex.  Not having frequently enough that I think preventative medication would be beneficial at this time. Will have him try ubrelvy as needed for migraine.   Midline low back pain without sciatica SI joint degeneration. He will plan to schedule with Dr. Darene Lamer   Meds ordered this encounter  Medications  . Ubrogepant (UBRELVY) 50 MG TABS    Sig: Take 50mg  initially at onset of migaine.  May repeat after 2 hours if needed.    Dispense:  30 tablet    Refill:  1    No follow-ups on file.    This visit occurred during the SARS-CoV-2 public health emergency.  Safety protocols were in place, including screening questions prior to the visit, additional usage of staff PPE, and extensive cleaning of exam room while observing appropriate contact time as indicated for disinfecting solutions.

## 2019-07-20 NOTE — Assessment & Plan Note (Addendum)
SI joint degeneration.  Currently treating with NSAIDS, discussed cutting back on this as he has had some iron deficiency anemia.   He will plan to schedule with Dr. Darene Lamer

## 2019-07-20 NOTE — Assessment & Plan Note (Signed)
Concerned about side effects related to imitrex.  Not having frequently enough that I think preventative medication would be beneficial at this time. Will have him try ubrelvy as needed for migraine.

## 2019-07-23 ENCOUNTER — Telehealth: Payer: Self-pay | Admitting: Family Medicine

## 2019-07-23 NOTE — Telephone Encounter (Signed)
Received fax for [place] on Ubrelvy sent through cover my meds waiting on determination. - - CF 

## 2019-07-30 NOTE — Telephone Encounter (Signed)
Received fax they denied coverage on Ubrelvy but they did send a form for [place] to appeal I filled out form placed in providers box for signature and will fax when signed. - CF

## 2019-07-31 NOTE — Telephone Encounter (Signed)
Received fax for appeal filled out had physician sign and faxed to [facility] now waiting on determination. - CF

## 2019-08-05 NOTE — Telephone Encounter (Signed)
Received fax from [facility] and they are still denying medication due to patient has to try and fail 2 triptans first. Placing in providers box for review. - CF

## 2019-08-27 ENCOUNTER — Ambulatory Visit: Payer: BC Managed Care – PPO | Admitting: Gastroenterology

## 2019-08-27 ENCOUNTER — Encounter: Payer: Self-pay | Admitting: Gastroenterology

## 2019-08-27 ENCOUNTER — Other Ambulatory Visit (INDEPENDENT_AMBULATORY_CARE_PROVIDER_SITE_OTHER): Payer: BC Managed Care – PPO

## 2019-08-27 VITALS — BP 112/76 | HR 92 | Temp 98.6°F | Ht 68.0 in | Wt 188.2 lb

## 2019-08-27 DIAGNOSIS — R1013 Epigastric pain: Secondary | ICD-10-CM | POA: Diagnosis not present

## 2019-08-27 DIAGNOSIS — D509 Iron deficiency anemia, unspecified: Secondary | ICD-10-CM

## 2019-08-27 DIAGNOSIS — K625 Hemorrhage of anus and rectum: Secondary | ICD-10-CM | POA: Diagnosis not present

## 2019-08-27 DIAGNOSIS — K219 Gastro-esophageal reflux disease without esophagitis: Secondary | ICD-10-CM

## 2019-08-27 LAB — CBC WITH DIFFERENTIAL/PLATELET
Basophils Absolute: 0 10*3/uL (ref 0.0–0.1)
Basophils Relative: 0.3 % (ref 0.0–3.0)
Eosinophils Absolute: 0.1 10*3/uL (ref 0.0–0.7)
Eosinophils Relative: 1.2 % (ref 0.0–5.0)
HCT: 44 % (ref 39.0–52.0)
Hemoglobin: 14.4 g/dL (ref 13.0–17.0)
Lymphocytes Relative: 20.9 % (ref 12.0–46.0)
Lymphs Abs: 1.6 10*3/uL (ref 0.7–4.0)
MCHC: 32.7 g/dL (ref 30.0–36.0)
MCV: 82 fl (ref 78.0–100.0)
Monocytes Absolute: 0.4 10*3/uL (ref 0.1–1.0)
Monocytes Relative: 4.6 % (ref 3.0–12.0)
Neutro Abs: 5.7 10*3/uL (ref 1.4–7.7)
Neutrophils Relative %: 73 % (ref 43.0–77.0)
Platelets: 419 10*3/uL — ABNORMAL HIGH (ref 150.0–400.0)
RBC: 5.36 Mil/uL (ref 4.22–5.81)
RDW: 18.1 % — ABNORMAL HIGH (ref 11.5–15.5)
WBC: 7.8 10*3/uL (ref 4.0–10.5)

## 2019-08-27 LAB — COMPREHENSIVE METABOLIC PANEL
ALT: 38 U/L (ref 0–53)
AST: 19 U/L (ref 0–37)
Albumin: 4.4 g/dL (ref 3.5–5.2)
Alkaline Phosphatase: 59 U/L (ref 39–117)
BUN: 10 mg/dL (ref 6–23)
CO2: 28 mEq/L (ref 19–32)
Calcium: 9.4 mg/dL (ref 8.4–10.5)
Chloride: 102 mEq/L (ref 96–112)
Creatinine, Ser: 1.12 mg/dL (ref 0.40–1.50)
GFR: 70.56 mL/min (ref >60.00)
Glucose, Bld: 143 mg/dL — ABNORMAL HIGH (ref 70–99)
Potassium: 4.2 mEq/L (ref 3.5–5.1)
Sodium: 138 mEq/L (ref 135–145)
Total Bilirubin: 0.6 mg/dL (ref 0.2–1.2)
Total Protein: 7.5 g/dL (ref 6.0–8.3)

## 2019-08-27 LAB — TSH: TSH: 0.91 u[IU]/mL (ref 0.35–4.50)

## 2019-08-27 LAB — VITAMIN B12: Vitamin B-12: 279 pg/mL (ref 211–911)

## 2019-08-27 LAB — FOLATE: Folate: 10.8 ng/mL (ref >5.9)

## 2019-08-27 MED ORDER — PANTOPRAZOLE SODIUM 40 MG PO TBEC
40.0000 mg | DELAYED_RELEASE_TABLET | Freq: Every day | ORAL | 0 refills | Status: DC
Start: 2019-08-27 — End: 2019-09-24

## 2019-08-27 MED ORDER — CLENPIQ 10-3.5-12 MG-GM -GM/160ML PO SOLN: 1.0000 | ORAL | 0 refills | Status: DC

## 2019-08-27 NOTE — Progress Notes (Addendum)
Chief Complaint: For GI evaluation  Referring Provider:  Luetta Nutting, DO      ASSESSMENT AND PLAN;   #1.  GERD with epigastric pain.  History of nonsteroidal use.  #2. IDA with H/O rectal bleeding.  #3. IBS-alt diarrhea and constipation with bloating.  #4. FH gastric cancer (Dad at age 46)   Plan: -EGD/colon for further evaluation. Discussed risks & benefits. (Risks including rare perforation req laparotomy, bleeding after bx/polypectomy req blood transfusion, rarely missing neoplasms, risks of anesthesia/sedation). Benefits outweigh the risks. Patient agrees to proceed. All the questions were answered. Consent forms given for review. -CBC, CMP, celiac, TSH, B12 and folate -Protonix 40mg  po qd, #30, 11 refills. -Stop all nonsteroidals as already been done. -If still with problems, proceed with ultrasound followed by CT Abdo/pelvis.    HPI:    Justin Ashley is a 43 y.o. male  Very pleasant with H/O HTN, migraines, LBP d/t DJD, hypogonadism, panic attacks, remote H. pylori action Here for IDA with occasional bright red blood per rectum. Hemoglobin was 13.3 (March 28), then he did donate blood.  Hemoglobin still has not come up and is in the range of 02-14-11.9 with low MCV despite p.o. iron supplementation.  He does complain of occasional epigastric discomfort especially after eating with intermittent heartburn.  He has been using "a lot of naproxen" previously due to back pain.  Has stopped over the last 1 to 2 weeks. Has nausea but no vomiting. No odynophagia or dysphagia.  No melena.  No recent weight loss.  Dx with IBS and would have occasional diarrhea and constipation.  Has some rectal discomfort.  Intermittent rectal bleeding mostly bright red in color.  Has right lower quadrant abdominal discomfort which does get better with defecation.  Has associated abdominal bloating which also gets better with defecation.  No sodas, chocolates, chewing gums, artificial sweeteners  and candy.  Denies having any history suggestive of lactose intolerance or gluten intolerance.  Very much concerned since his dad had gastric cancer and unfortunately passed away at age 4.  Has been treated for H. pylori positive antibody several years ago.  SH-self-employed.  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 Past Medical History:  Diagnosis Date  . Dislocation of metatarsophalangeal joint of right great toe   . Erectile dysfunction 12/24/2012   BCBS will not cover Cialis   . Essential hypertension 06/30/2014  . History of thrombocytosis 03/09/2013  . Hypogonadism in male 12/15/2013   Recheck therapy January 2016   . Kidney stone   . Pneumonia age 64  . Suicide attempt ([place]) 12/10/2013    History reviewed. No pertinent surgical history.  Family History  Problem Relation Age of Onset  . Cancer Mother        lung  . Colon polyps Mother   . Cancer Father        stomach, deceased @  56yrs  . Hypertension Father   . Other Paternal Uncle        intestional cancer  . Stomach cancer Cousin   . Heart attack Cousin   . Esophageal cancer Neg Hx     Social History   Tobacco Use  . Smoking status: Former Smoker    Years: 22.00    Types: E-cigarettes    Quit date: 03/12/2009    Years since quitting: 10.4  . Smokeless tobacco: Never Used  Vaping Use  . Vaping Use: Every day  Substance Use Topics  . Alcohol use: No  . Drug use:  No    Current Outpatient Medications  Medication Sig Dispense Refill  . AMBULATORY NON FORMULARY MEDICATION BD 50mL syringe and BD 22G 1 and 1/2" needle, use to inject testosterone every two weeks.  BD 18G 1 and 1/2" needle used to draw up testosterone. Dx: Hypogonadism 20 Units 11  . clonazePAM (KLONOPIN) 0.5 MG tablet Take 1 tablet (0.5 mg total) by mouth 2 (two) times daily as needed for anxiety. 30 tablet 0  . fluticasone (FLONASE) 50 MCG/ACT nasal spray INHALE 1 SPRAY IN EACH NOSTRIL TWICE DAILY( USE LEFT HAND FOR RIGHT NOSTRIL AND RIGHT  HAND FOR LEFT NOSTRIL) 48 g 0  . losartan (COZAAR) 50 MG tablet Take 1 tablet (50 mg total) by mouth daily. 90 tablet 1  . tadalafil (ADCIRCA/CIALIS) 20 MG tablet Take 0.5-1 tablets (10-20 mg total) by mouth every other day as needed for erectile dysfunction. 30 tablet 11  . testosterone cypionate (DEPOTESTOSTERONE CYPIONATE) 200 MG/ML injection Inject 1.5 mLs (300 mg total) into the muscle every 14 (fourteen) days. 10 mL 3  . Ubrogepant (UBRELVY) 50 MG TABS Take 50mg  initially at onset of migaine.  May repeat after 2 hours if needed. 30 tablet 1  . AMBULATORY NON FORMULARY MEDICATION ProAir RespiClick.  Inhale two puffs every four hours only as needed for shortness of breath or wheezing. Use savings voucher. (Patient not taking: Reported on 08/27/2019) 1 Inhaler 0   No current facility-administered medications for this visit.    Allergies  Allergen Reactions  . Hydrocodone-Acetaminophen   . Lisinopril     Cough  . Oxycodone-Acetaminophen   . Zoloft [Sertraline Hcl]     Suicide attempt    Review of Systems:  Constitutional: Denies fever, chills, diaphoresis, appetite change and has fatigue.  HEENT: Has allergies, sinus trouble. Respiratory: Denies SOB, DOE, cough, chest tightness,  and wheezing.   Cardiovascular: Denies chest pain, palpitations and leg swelling.  Genitourinary: Denies dysuria, urgency, frequency, hematuria, flank pain and difficulty urinating.  Musculoskeletal: Denies myalgias, back pain, joint swelling, arthralgias and gait problem.  Skin: No rash.  Neurological: Denies dizziness, seizures, syncope, weakness, light-headedness, numbness and has headaches.  Hematological: Denies adenopathy. Easy bruising, personal or family bleeding history  Psychiatric/Behavioral: No anxiety or depression.  Has panic attacks.     Physical Exam:    BP 112/76   Pulse 92   Temp 98.6 F (37 C)   Ht 5\' 8"  (1.727 m)   Wt 188 lb 4 oz (85.4 kg)   BMI 28.62 kg/m  Wt Readings from  Last 3 Encounters:  08/27/19 188 lb 4 oz (85.4 kg)  07/09/19 186 lb (84.4 kg)  01/09/19 178 lb (80.7 kg)   Constitutional:  Well-developed, in no acute distress. Psychiatric: Normal mood and affect. Behavior is normal. HEENT: Pupils normal.  Conjunctivae are normal. No scleral icterus. Neck supple.  Cardiovascular: Normal rate, regular rhythm. No edema Pulmonary/chest: Effort normal and breath sounds normal. No wheezing, rales or rhonchi. Abdominal: Soft, nondistended.  Epigastric tenderness. Bowel sounds active throughout. There are no masses palpable. No hepatomegaly. Rectal:  defered Neurological: Alert and oriented to person place and time. Skin: Skin is warm and dry. No rashes noted.  Data Reviewed: I have personally reviewed following labs and imaging studies  CBC: CBC Latest Ref Rng & Units 07/13/2019 02/23/2019 01/12/2019  WBC 3.8 - 10.8 Thousand/uL 5.4 4.9 4.5  Hemoglobin 13.2 - 17.1 g/dL 12.5(L) 12.7(L) 12.9(L)  Hematocrit 38 - 50 % 39.8 39.6 40.1  Platelets 140 -  400 Thousand/uL 476(H) 488(H) 442(H)    CMP: CMP Latest Ref Rng & Units 07/13/2019 01/12/2019 05/19/2018  Glucose 65 - 99 mg/dL 105(H) 102(H) 97  BUN 7 - 25 mg/dL 10 11 10   Creatinine 0.60 - 1.35 mg/dL 1.03 1.02 1.07  Sodium 135 - 146 mmol/L 140 141 141  Potassium 3.5 - 5.3 mmol/L 4.3 4.7 4.3  Chloride 98 - 110 mmol/L 107 108 105  CO2 20 - 32 mmol/L 28 24 26   Calcium 8.6 - 10.3 mg/dL 9.4 9.3 9.9  Total Protein 6.1 - 8.1 g/dL - 6.9 7.0  Total Bilirubin 0.2 - 1.2 mg/dL - 0.4 0.6  Alkaline Phos 40 - 115 U/L - - -  AST 10 - 40 U/L - 14 12  ALT 9 - 46 U/L - 27 14     Carmell Austria, MD 08/27/2019, 11:03 AM  Cc: Luetta Nutting, DO

## 2019-08-27 NOTE — Patient Instructions (Addendum)
If you are age 46 or older, your body mass index should be between 23-30. Your Body mass index is 28.62 kg/m. If this is out of the aforementioned range listed, please consider follow up with your Primary Care Provider.  If you are age 39 or younger, your body mass index should be between 19-25. Your Body mass index is 28.62 kg/m. If this is out of the aformentioned range listed, please consider follow up with your Primary Care Provider.   You have been scheduled for an endoscopy and colonoscopy. Please follow the written instructions given to you at your visit today. Please pick up your prep supplies at the pharmacy within the next 1-3 days. If you use inhalers (even only as needed), please bring them with you on the day of your procedure. Your physician has requested that you go to www.startemmi.com and enter the access code given to you at your visit today. This web site gives a general overview about your procedure. However, you should still follow specific instructions given to you by our office regarding your preparation for the procedure.  We have sent the following medications to your pharmacy for you to pick up at your convenience: Clenpiq - you have been given a coupon. Protonix take once daily.  Your provider has requested that you go to the basement level for lab work at [facility] [address]. Press "B" on the elevator. The lab is located at the first door on the left as you exit the elevator.  Thank you for choosing me and [facility].   Jackquline Denmark, MD

## 2019-08-29 LAB — CELIAC PANEL 10
Antigliadin Abs, IgA: 5 units (ref 0–19)
Endomysial IgA: NEGATIVE
Gliadin IgG: 1 units (ref 0–19)
IgA/Immunoglobulin A, Serum: 339 mg/dL (ref 90–386)
Tissue Transglut Ab: 4 U/mL (ref 0–5)
Transglutaminase IgA: <2 U/mL (ref 0–3)

## 2019-09-24 ENCOUNTER — Other Ambulatory Visit: Payer: Self-pay | Admitting: Gastroenterology

## 2019-09-28 ENCOUNTER — Encounter: Payer: Self-pay | Admitting: Gastroenterology

## 2019-10-12 ENCOUNTER — Other Ambulatory Visit: Payer: Self-pay

## 2019-10-12 ENCOUNTER — Encounter: Payer: Self-pay | Admitting: Gastroenterology

## 2019-10-12 ENCOUNTER — Ambulatory Visit (AMBULATORY_SURGERY_CENTER): Payer: BC Managed Care – PPO | Admitting: Gastroenterology

## 2019-10-12 VITALS — BP 102/82 | HR 78 | Temp 97.8°F | Resp 19 | Ht 68.0 in | Wt 188.0 lb

## 2019-10-12 DIAGNOSIS — D122 Benign neoplasm of ascending colon: Secondary | ICD-10-CM | POA: Diagnosis not present

## 2019-10-12 DIAGNOSIS — K625 Hemorrhage of anus and rectum: Secondary | ICD-10-CM

## 2019-10-12 DIAGNOSIS — K2970 Gastritis, unspecified, without bleeding: Secondary | ICD-10-CM | POA: Diagnosis not present

## 2019-10-12 DIAGNOSIS — D125 Benign neoplasm of sigmoid colon: Secondary | ICD-10-CM | POA: Diagnosis not present

## 2019-10-12 DIAGNOSIS — K2270 Barrett's esophagus without dysplasia: Secondary | ICD-10-CM | POA: Diagnosis not present

## 2019-10-12 DIAGNOSIS — B9681 Helicobacter pylori [H. pylori] as the cause of diseases classified elsewhere: Secondary | ICD-10-CM

## 2019-10-12 DIAGNOSIS — D509 Iron deficiency anemia, unspecified: Secondary | ICD-10-CM

## 2019-10-12 DIAGNOSIS — K5730 Diverticulosis of large intestine without perforation or abscess without bleeding: Secondary | ICD-10-CM

## 2019-10-12 DIAGNOSIS — K2100 Gastro-esophageal reflux disease with esophagitis, without bleeding: Secondary | ICD-10-CM

## 2019-10-12 DIAGNOSIS — K2950 Unspecified chronic gastritis without bleeding: Secondary | ICD-10-CM | POA: Diagnosis not present

## 2019-10-12 MED ORDER — PANTOPRAZOLE SODIUM 40 MG PO TBEC: DELAYED_RELEASE_TABLET | ORAL | 4 refills | Status: DC

## 2019-10-12 MED ORDER — SODIUM CHLORIDE 0.9 % IV SOLN: 500.0000 mL | Freq: Once | INTRAVENOUS | Status: DC

## 2019-10-12 NOTE — Op Note (Signed)
[place] Patient Name: Justin Ashley Procedure Date: 10/12/2019 2:47 PM MRN: [id number] Endoscopist: Jackquline Denmark , MD Age: 44 Referring MD:  Date of Birth: January 29, 1976 Gender: Male Account #: [id number] Procedure:                Upper GI endoscopy Indications:              Epigastric abdominal pain, GERD, IDA Medicines:                Monitored Anesthesia Care Procedure:                Pre-Anesthesia Assessment:                           - Prior to the procedure, a History and Physical                            was performed, and patient medications and                            allergies were reviewed. The patient's tolerance of                            previous anesthesia was also reviewed. The risks                            and benefits of the procedure and the sedation                            options and risks were discussed with the patient.                            All questions were answered, and informed consent                            was obtained. Prior Anticoagulants: The patient has                            taken no previous anticoagulant or antiplatelet                            agents. ASA Grade Assessment: II - A patient with                            mild systemic disease. After reviewing the risks                            and benefits, the patient was deemed in                            satisfactory condition to undergo the procedure.                           After obtaining informed consent, the endoscope was  passed under direct vision. Throughout the                            procedure, the patient's blood pressure, pulse, and                            oxygen saturations were monitored continuously. The                            Endoscope was introduced through the mouth, and                            advanced to the second part of duodenum. The upper                            GI endoscopy was  accomplished without difficulty.                            The patient tolerated the procedure well. Scope In: Scope Out: Findings:                 LA Grade B (one or more mucosal breaks greater than                            5 mm, not extending between the tops of two mucosal                            folds) esophagitis with no bleeding was found 38 cm                            from the incisors. Biopsies were taken with a cold                            forceps for histology.                           Localized mild inflammation characterized by                            erythema was found in the gastric body and in the                            gastric antrum. Biopsies were taken with a cold                            forceps for histology.                           The examined duodenum was normal. Biopsies were                            taken with a cold forceps for histology. Complications:            No immediate complications. Estimated Blood Loss:  Estimated blood loss: none. Impression:               - LA Grade B reflux esophagitis with no bleeding.                            Biopsied.                           - Gastritis. Biopsied.                           - Normal examined duodenum. Biopsied. Recommendation:           - Patient has a contact number available for                            emergencies. The signs and symptoms of potential                            delayed complications were discussed with the                            patient. Return to normal activities tomorrow.                            Written discharge instructions were provided to the                            patient.                           - Resume previous diet.                           - Increase Protonix 40 mg p.o. twice daily x 4                            weeks, then reduce it to once a day. #60, 4 refills                           - Avoid ibuprofen, naproxen, or other  non-steroidal                            anti-inflammatory drugs.                           - Await pathology results. Jackquline Denmark, MD 10/12/2019 3:35:34 PM This report has been signed electronically.

## 2019-10-12 NOTE — Op Note (Signed)
Justin Ashley Procedure Date: 10/12/2019 2:47 PM MRN: [id number] Endoscopist: Jackquline Denmark , MD Age: 44 Referring MD:  Date of Birth: 1975/11/29 Gender: Male Account #: [id number] Procedure:                Colonoscopy Indications:              Colon cancer screening in patient at increased                            risk: Family history of 1st-degree relative with                            colon polyps. H/O IDA Medicines:                Monitored Anesthesia Care Procedure:                Pre-Anesthesia Assessment:                           - Prior to the procedure, a History and Physical                            was performed, and patient medications and                            allergies were reviewed. The patient's tolerance of                            previous anesthesia was also reviewed. The risks                            and benefits of the procedure and the sedation                            options and risks were discussed with the patient.                            All questions were answered, and informed consent                            was obtained. Prior Anticoagulants: The patient has                            taken no previous anticoagulant or antiplatelet                            agents. ASA Grade Assessment: II - A patient with                            mild systemic disease. After reviewing the risks                            and benefits, the patient was deemed in  satisfactory condition to undergo the procedure.                           After obtaining informed consent, the colonoscope                            was passed under direct vision. Throughout the                            procedure, the patient's blood pressure, pulse, and                            oxygen saturations were monitored continuously. The                            Colonoscope was introduced through the anus and                             advanced to the 2 cm into the ileum. The                            colonoscopy was performed without difficulty. The                            patient tolerated the procedure well. The quality                            of the bowel preparation was good. The terminal                            ileum, ileocecal valve, appendiceal orifice, and                            rectum were photographed. Scope In: 3:15:12 PM Scope Out: 3:29:10 PM Scope Withdrawal Time: 0 hours 10 minutes 59 seconds  Total Procedure Duration: 0 hours 13 minutes 58 seconds  Findings:                 Two sessile polyps were found in the mid sigmoid                            colon and ascending colon. The polyps were 2 to 4                            mm in size. These polyps were removed with a cold                            biopsy forceps. Resection and retrieval were                            complete.                           A few small-mouthed diverticula were found in the  sigmoid colon and a single diverticula in proximal                            ascending colon with impacted stool.                           Non-bleeding internal hemorrhoids were found during                            retroflexion. The hemorrhoids were small.                           The terminal ileum appeared normal.                           The exam was otherwise without abnormality on                            direct and retroflexion views. Complications:            No immediate complications. Estimated Blood Loss:     Estimated blood loss: none. Impression:               - Two 2 to 4 mm polyps in the mid sigmoid colon and                            in the ascending colon, removed with a cold biopsy                            forceps. Resected and retrieved.                           - Mild colonic diverticulosis.                           - Non-bleeding internal  hemorrhoids.                           - The examined portion of the ileum was normal.                           - The examination was otherwise normal on direct                            and retroflexion views. Recommendation:           - Patient has a contact number available for                            emergencies. The signs and symptoms of potential                            delayed complications were discussed with the                            patient. Return to normal activities tomorrow.  Written discharge instructions were provided to the                            patient.                           - Resume previous diet.                           - Continue present medications. Add Benefiber 1                            tablespoon p.o. every morning with 8 ounces of                            water.                           - Await pathology results.                           - Repeat colonoscopy for surveillance based on                            pathology results.                           - Return to GI clinic in 12 weeks. Jackquline Denmark, MD 10/12/2019 3:40:15 PM This report has been signed electronically.

## 2019-10-12 NOTE — Patient Instructions (Signed)
YOU HAD AN ENDOSCOPIC PROCEDURE TODAY AT [facility]:   Refer to the procedure report that was given to you for any specific questions about what was found during the examination.  If the procedure report does not answer your questions, please call your gastroenterologist to clarify.  If you requested that your care partner not be given the details of your procedure findings, then the procedure report has been included in a sealed envelope for you to review at your convenience later.  YOU SHOULD EXPECT: Some feelings of bloating in the abdomen. Passage of more gas than usual.  Walking can help get rid of the air that was put into your GI tract during the procedure and reduce the bloating. If you had a lower endoscopy (such as a colonoscopy or flexible sigmoidoscopy) you may notice spotting of blood in your stool or on the toilet paper. If you underwent a bowel prep for your procedure, you may not have a normal bowel movement for a few days.  Please Note:  You might notice some irritation and congestion in your nose or some drainage.  This is from the oxygen used during your procedure.  There is no need for concern and it should clear up in a day or so.  SYMPTOMS TO REPORT IMMEDIATELY:   Following lower endoscopy (colonoscopy or flexible sigmoidoscopy):  Excessive amounts of blood in the stool  Significant tenderness or worsening of abdominal pains  Swelling of the abdomen that is new, acute  Fever of 100F or higher   Following upper endoscopy (EGD)  Vomiting of blood or coffee ground material  New chest pain or pain under the shoulder blades  Painful or persistently difficult swallowing  New shortness of breath  Fever of 100F or higher  Black, tarry-looking stools  For urgent or emergent issues, a gastroenterologist can be reached at any hour by calling [phone]. Do not use MyChart messaging for urgent concerns.    DIET:  We do recommend a small meal at first, but  then you may proceed to your regular diet.  Drink plenty of fluids but you should avoid alcoholic beverages for 24 hours.  ACTIVITY:  You should plan to take it easy for the rest of today and you should NOT DRIVE or use heavy machinery until tomorrow (because of the sedation medicines used during the test).    FOLLOW UP: Our staff will call the number listed on your records 48-72 hours following your procedure to check on you and address any questions or concerns that you may have regarding the information given to you following your procedure. If we do not reach you, we will leave a message.  We will attempt to reach you two times.  During this call, we will ask if you have developed any symptoms of COVID 19. If you develop any symptoms (ie: fever, flu-like symptoms, shortness of breath, cough etc.) before then, please call [phone].  If you test positive for Covid 19 in the 2 weeks post procedure, please call and report this information to [place].    If any biopsies were taken you will be contacted by phone or by letter within the next 1-3 weeks.  Please call us at [phone] if you have not heard about the biopsies in 3 weeks.    SIGNATURES/CONFIDENTIALITY: You and/or your care partner have signed paperwork which will be entered into your electronic medical record.  These signatures attest to the fact that that the information above on  your After Visit Summary has been reviewed and is understood.  Full responsibility of the confidentiality of this discharge information lies with you and/or your care-partner.   Avoid ibuprofen,naproxen,other non-steroidal anti-inflammatory drugs,resume remainder of medication.Information given on esophagitis,gastritis,polyps,diverticulosis and hemorrhoids.

## 2019-10-12 NOTE — Progress Notes (Signed)
Pt's states no medical or surgical changes since previsit or office visit.  VS SP  Reports no surgical hx

## 2019-10-12 NOTE — Progress Notes (Signed)
To PACU, VSS. Report to [email or web address] 

## 2019-10-12 NOTE — Progress Notes (Signed)
Called to room to assist during endoscopic procedure.  Patient ID and intended procedure confirmed with present staff. Received instructions for my participation in the procedure from the performing physician.  

## 2019-10-13 ENCOUNTER — Other Ambulatory Visit: Payer: Self-pay

## 2019-10-13 DIAGNOSIS — D509 Iron deficiency anemia, unspecified: Secondary | ICD-10-CM

## 2019-10-13 MED ORDER — PANTOPRAZOLE SODIUM 40 MG PO TBEC
40.0000 mg | DELAYED_RELEASE_TABLET | Freq: Every day | ORAL | 3 refills | Status: DC
Start: 2019-10-13 — End: 2021-02-14

## 2019-10-13 MED ORDER — PANTOPRAZOLE SODIUM 40 MG PO TBEC: 40.0000 mg | DELAYED_RELEASE_TABLET | Freq: Two times a day (BID) | ORAL | 0 refills | Status: DC

## 2019-10-13 NOTE — Progress Notes (Signed)
Pharmacy called stating protonix written in 8/2 was incorrect so per procedure note Protonix should be written take 40 mg tablet 1 tablet 2 times daily for 4 weeks then decrease to 1 tablet daily

## 2019-10-14 ENCOUNTER — Telehealth: Payer: Self-pay

## 2019-10-14 NOTE — Telephone Encounter (Signed)
  Follow up Call-  Call back number 10/12/2019  Post procedure Call Back phone  # [phone]  Permission to leave phone message Yes  Some recent data might be hidden     Patient questions:  Do you have a fever, pain , or abdominal swelling? No. Pain Score  0 *  Have you tolerated food without any problems? Yes.    Have you been able to return to your normal activities? Yes.    Do you have any questions about your discharge instructions: Diet   No. Medications  No. Follow up visit  No.  Do you have questions or concerns about your Care? No.  Actions: * If pain score is 4 or above: No action needed, pain <4.  Have you developed a fever since your procedure? no 2.   Have you had an respiratory symptoms (SOB or cough) since your procedure?no  3.   Have you tested positive for COVID 19 since your procedure no  4.   Have you had any family members/close contacts diagnosed with the COVID 19 since your procedure?  no   If yes to any of these questions please route to Joylene John, RN and Erenest Rasher, RN

## 2019-10-16 ENCOUNTER — Encounter: Payer: Self-pay | Admitting: Gastroenterology

## 2019-10-16 ENCOUNTER — Other Ambulatory Visit: Payer: Self-pay | Admitting: Gastroenterology

## 2019-10-16 DIAGNOSIS — B9681 Helicobacter pylori [H. pylori] as the cause of diseases classified elsewhere: Secondary | ICD-10-CM

## 2019-10-16 DIAGNOSIS — K2970 Gastritis, unspecified, without bleeding: Secondary | ICD-10-CM

## 2019-10-16 MED ORDER — AMOXICILLIN 500 MG PO TABS
ORAL_TABLET | ORAL | 0 refills | Status: DC
Start: 2019-10-16 — End: 2020-01-01

## 2019-10-16 MED ORDER — METRONIDAZOLE 500 MG PO TABS
500.0000 mg | ORAL_TABLET | Freq: Two times a day (BID) | ORAL | 0 refills | Status: AC
Start: 2019-10-16 — End: 2019-10-30

## 2019-10-16 MED ORDER — PANTOPRAZOLE SODIUM 40 MG PO TBEC
40.0000 mg | DELAYED_RELEASE_TABLET | Freq: Two times a day (BID) | ORAL | 0 refills | Status: DC
Start: 2019-10-16 — End: 2020-01-01

## 2019-10-16 MED ORDER — CLARITHROMYCIN 500 MG PO TABS
500.0000 mg | ORAL_TABLET | Freq: Two times a day (BID) | ORAL | 0 refills | Status: AC
Start: 2019-10-16 — End: 2019-10-30

## 2019-10-16 NOTE — Progress Notes (Signed)
Justin Ashley can you let the patient know gastric biopsies are positive for H pylori. Would recommend the following treatment regimen for 14 days: Can start on Monday 8/9 Amoxicillin 1gm BID Clarithromycin 500mg  BID Flagyl 500mg  BID Protonix 40 BID, after 2 weeks can do Protonix once a day.  Can you please order this if no signfiicant interactions noted. Once done with therapy, the patient should wait one month and perform a stool study for H pylori antigen to ensure negative. The PPI needs to be held at least 2 weeks prior to submitting the stool sample. The patient should avoid alcohol while taking Flagyl.   Thanks  Dr Lyndel Safe

## 2019-10-27 ENCOUNTER — Encounter: Payer: Self-pay | Admitting: Gastroenterology

## 2019-11-03 ENCOUNTER — Telehealth: Payer: Self-pay | Admitting: Gastroenterology

## 2019-11-03 NOTE — Telephone Encounter (Signed)
Pt is requesting a call back from a nurse in regards to his stool sample testing.

## 2019-11-03 NOTE — Telephone Encounter (Signed)
I have called and spoke to patient and answered all questions regarding his stool test.

## 2019-11-08 NOTE — Progress Notes (Signed)
Duplicate    No need for this  RG

## 2019-11-10 DIAGNOSIS — F4320 Adjustment disorder, unspecified: Secondary | ICD-10-CM | POA: Diagnosis not present

## 2019-11-26 DIAGNOSIS — F4320 Adjustment disorder, unspecified: Secondary | ICD-10-CM | POA: Diagnosis not present

## 2019-12-10 DIAGNOSIS — F4320 Adjustment disorder, unspecified: Secondary | ICD-10-CM | POA: Diagnosis not present

## 2020-01-01 ENCOUNTER — Encounter: Payer: Self-pay | Admitting: Gastroenterology

## 2020-01-01 ENCOUNTER — Ambulatory Visit: Payer: BC Managed Care – PPO | Admitting: Gastroenterology

## 2020-01-01 ENCOUNTER — Other Ambulatory Visit: Payer: Self-pay | Admitting: Family Medicine

## 2020-01-01 VITALS — BP 116/84 | HR 82 | Ht 68.0 in | Wt 178.5 lb

## 2020-01-01 DIAGNOSIS — K219 Gastro-esophageal reflux disease without esophagitis: Secondary | ICD-10-CM

## 2020-01-01 DIAGNOSIS — A048 Other specified bacterial intestinal infections: Secondary | ICD-10-CM

## 2020-01-01 DIAGNOSIS — K2270 Barrett's esophagus without dysplasia: Secondary | ICD-10-CM | POA: Diagnosis not present

## 2020-01-01 NOTE — Progress Notes (Signed)
Chief Complaint: For GI evaluation  Referring Provider:  Luetta Nutting, DO      ASSESSMENT AND PLAN;   #1. 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 IDA with neg colon except for small tubular adenomas 10/2019  #3. IBS-alt diarrhea and constipation with bloating.  #4. FH gastric cancer (Dad at age 46)   Plan: -Protonix 40mg  po qd, #30, 11 refills. -Having blood tests in coming days as a part of physical by Rebecca.  If still with anemia, will perform further work-up. -Stool antigen test off PPIs x 2 weeks confirm HP eradication. -Rpt EGD in 3 yrs and colon in 5 years.    HPI:    Justin Ashley is a 44 y.o. male  Very pleasant with GERD, H/O POLYPS, H/O HTN, migraines, LBP d/t DJD, hypogonadism, panic attacks, remote H. pylori infection  For follow-up visit.  Doing much better.  Denies having any significant heartburn.  No odynophagia or dysphagia.  No fever chills or night sweats.  Denies having any abdominal pain.  His diarrhea and constipation is much better.  Had H. pylori and was treated with triple drug therapy.  He is yet to do the stool antigen test.  Has not donated any more blood.  His most recent hemoglobin was normal.  He is due to have physical with Dr. Zigmund Daniel in coming days.  He will get his CBC and CMP checked.  Pleased with the progress.  SH-self-employed.  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  Past GI procedures:  EGD 10/12/2019 - LA Grade B reflux esophagitis with no bleeding. Biopsied. - Gastritis. Biopsied. - Normal examined duodenum. Biopsied.  Colon 10/12/2019 - Two 2 to 4 mm polyps in the mid sigmoid colon and in the ascending colon, removed with a cold biopsy forceps. Resected and retrieved. - Mild colonic diverticulosis. - Non-bleeding internal hemorrhoids. - The examined portion of the ileum was Nl  Path: 10/12/2019 1. Surgical [P], small bowel - BENIGN DUODENAL  MUCOSA. - NO FEATURES OF CELIAC SPRUE OR GRANULOMAS. 2. Surgical [P], gsatric antrum - ANTRAL AND OXYNTIC MUCOSA WITH MILDLY ACTIVE CHRONIC HELICOBACTER GASTRITIS. - WARTHIN-STARRY POSITIVE FOR HELICOBACTER PYLORI. - NO INTESTINAL METAPLASIA, DYSPLASIA OR CARCINOMA. 3. Surgical [P], distal esophagus - GASTROESOPHAGEAL MUCOSA WITH INTESTINAL METAPLASIA CONSISTENT WITH BARRETT'S ESOPHAGUS. - NO DYSPLASIA OR CARCINOMA. 4. Surgical [P], colon ,ascending, polyp (1) - TUBULAR ADENOMA(S). - NO HIGH GRADE DYSPLASIA OR CARCINOMA. 5. Surgical [P], colon, sigmoid, polyp (10 - TUBULAR ADENOMA(S). - NO HIGH GRADE DYSPLASIA OR CARCINOMA. Past Medical History:  Diagnosis Date  . Dislocation of metatarsophalangeal joint of right great toe   . Erectile dysfunction 12/24/2012   BCBS will not cover Cialis   . Essential hypertension 06/30/2014  . History of thrombocytosis 03/09/2013  . Hypogonadism in male 12/15/2013   Recheck therapy January 2016   . Kidney stone   . Pneumonia age 80  . Sleep apnea    self report  . Suicide attempt ([place]) 12/10/2013    History reviewed. No pertinent surgical history.  Family History  Problem Relation Age of Onset  . Cancer Mother        lung  . Colon polyps Mother   . Cancer Father        stomach, deceased @  44yrs  . Hypertension Father   . Colon cancer Father 55  . Stomach cancer Father   . Other Paternal Uncle  intestional cancer  . Colon cancer Paternal Uncle 68  . Stomach cancer Cousin   . Colon cancer Cousin 79  . Heart attack Cousin   . Esophageal cancer Neg Hx     Social History   Tobacco Use  . Smoking status: Former Smoker    Years: 22.00    Types: E-cigarettes    Quit date: 03/12/2009    Years since quitting: 10.8  . Smokeless tobacco: Never Used  Vaping Use  . Vaping Use: Every day  Substance Use Topics  . Alcohol use: No  . Drug use: No    Current Outpatient Medications  Medication Sig Dispense Refill  . AMBULATORY NON  FORMULARY MEDICATION BD 33mL syringe and BD 22G 1 and 1/2" needle, use to inject testosterone every two weeks.  BD 18G 1 and 1/2" needle used to draw up testosterone. Dx: Hypogonadism 20 Units 11  . AMBULATORY NON FORMULARY MEDICATION ProAir RespiClick.  Inhale two puffs every four hours only as needed for shortness of breath or wheezing. Use savings voucher. 1 Inhaler 0  . losartan (COZAAR) 50 MG tablet TAKE 1 TABLET(50 MG) BY MOUTH DAILY 90 tablet 1  . pantoprazole (PROTONIX) 40 MG tablet Take 1 tablet (40 mg total) by mouth daily. 90 tablet 3  . tadalafil (ADCIRCA/CIALIS) 20 MG tablet Take 0.5-1 tablets (10-20 mg total) by mouth every other day as needed for erectile dysfunction. 30 tablet 11  . testosterone cypionate (DEPOTESTOSTERONE CYPIONATE) 200 MG/ML injection Inject 1.5 mLs (300 mg total) into the muscle every 14 (fourteen) days. 10 mL 3  . Ubrogepant (UBRELVY) 50 MG TABS Take 50mg  initially at onset of migaine.  May repeat after 2 hours if needed. 30 tablet 1  . clonazePAM (KLONOPIN) 0.5 MG tablet Take 1 tablet (0.5 mg total) by mouth 2 (two) times daily as needed for anxiety. (Patient not taking: Reported on 01/01/2020) 30 tablet 0  . fluticasone (FLONASE) 50 MCG/ACT nasal spray INHALE 1 SPRAY IN EACH NOSTRIL TWICE DAILY( USE LEFT HAND FOR RIGHT NOSTRIL AND RIGHT HAND FOR LEFT NOSTRIL) (Patient not taking: Reported on 01/01/2020) 48 g 0   Current Facility-Administered Medications  Medication Dose Route Frequency Provider Last Rate Last Admin  . 0.9 %  sodium chloride infusion  500 mL Intravenous Once Jackquline Denmark, MD        Allergies  Allergen Reactions  . Hydrocodone-Acetaminophen   . Lisinopril     Cough  . Oxycodone-Acetaminophen   . Zoloft [Sertraline Hcl]     Suicide attempt    Review of Systems:  neg     Physical Exam:    BP 116/84   Pulse 82   Ht 5\' 8"  (1.727 m)   Wt 178 lb 8 oz (81 kg)   BMI 27.14 kg/m  Wt Readings from Last 3 Encounters:  01/01/20 178 lb 8  oz (81 kg)  10/12/19 188 lb (85.3 kg)  08/27/19 188 lb 4 oz (85.4 kg)   Constitutional:  Well-developed, in no acute distress. Psychiatric: Normal mood and affect. Behavior is normal. HEENT: Pupils normal.  Conjunctivae are normal. No scleral icterus. Abdominal: Soft, nondistended.  Epigastric tenderness. Bowel sounds active throughout. There are no masses palpable. No hepatomegaly. Rectal:  defered Neurological: Alert and oriented to person place and time. Skin: Skin is warm and dry. No rashes noted.  Data Reviewed: I have personally reviewed following labs and imaging studies  CBC: CBC Latest Ref Rng & Units 08/27/2019 07/13/2019 02/23/2019  WBC 4.0 - 10.5  K/uL 7.8 5.4 4.9  Hemoglobin 13.0 - 17.0 g/dL 14.4 12.5(L) 12.7(L)  Hematocrit 39 - 52 % 44.0 39.8 39.6  Platelets 150 - 400 K/uL 419.0(H) 476(H) 488(H)    CMP: CMP Latest Ref Rng & Units 08/27/2019 07/13/2019 01/12/2019  Glucose 70 - 99 mg/dL 143(H) 105(H) 102(H)  BUN 6 - 23 mg/dL 10 10 11   Creatinine 0.40 - 1.50 mg/dL 1.12 1.03 1.02  Sodium 135 - 145 mEq/L 138 140 141  Potassium 3.5 - 5.1 mEq/L 4.2 4.3 4.7  Chloride 96 - 112 mEq/L 102 107 108  CO2 19 - 32 mEq/L 28 28 24   Calcium 8.4 - 10.5 mg/dL 9.4 9.4 9.3  Total Protein 6.0 - 8.3 g/dL 7.5 - 6.9  Total Bilirubin 0.2 - 1.2 mg/dL 0.6 - 0.4  Alkaline Phos 39 - 117 U/L 59 - -  AST 0 - 37 U/L 19 - 14  ALT 0 - 53 U/L 38 - 27     Carmell Austria, MD 01/01/2020, 4:06 PM  Cc: Luetta Nutting, DO

## 2020-01-01 NOTE — Patient Instructions (Signed)
You have been given an order for an H Pylori stool test.  Make sure to stop your PPI for 2 weeks before you take it.

## 2020-01-06 ENCOUNTER — Ambulatory Visit (INDEPENDENT_AMBULATORY_CARE_PROVIDER_SITE_OTHER): Payer: BC Managed Care – PPO | Admitting: Family Medicine

## 2020-01-06 ENCOUNTER — Encounter: Payer: Self-pay | Admitting: Family Medicine

## 2020-01-06 VITALS — BP 120/88 | HR 99 | Temp 98.2°F | Wt 181.0 lb

## 2020-01-06 DIAGNOSIS — M5030 Other cervical disc degeneration, unspecified cervical region: Secondary | ICD-10-CM

## 2020-01-06 DIAGNOSIS — G8929 Other chronic pain: Secondary | ICD-10-CM

## 2020-01-06 DIAGNOSIS — M545 Low back pain, unspecified: Secondary | ICD-10-CM

## 2020-01-06 DIAGNOSIS — D500 Iron deficiency anemia secondary to blood loss (chronic): Secondary | ICD-10-CM

## 2020-01-06 DIAGNOSIS — Z862 Personal history of diseases of the blood and blood-forming organs and certain disorders involving the immune mechanism: Secondary | ICD-10-CM

## 2020-01-06 DIAGNOSIS — M5412 Radiculopathy, cervical region: Secondary | ICD-10-CM

## 2020-01-06 DIAGNOSIS — A048 Other specified bacterial intestinal infections: Secondary | ICD-10-CM

## 2020-01-06 DIAGNOSIS — E291 Testicular hypofunction: Secondary | ICD-10-CM

## 2020-01-06 DIAGNOSIS — D509 Iron deficiency anemia, unspecified: Secondary | ICD-10-CM | POA: Insufficient documentation

## 2020-01-06 DIAGNOSIS — Z23 Encounter for immunization: Secondary | ICD-10-CM | POA: Diagnosis not present

## 2020-01-06 MED ORDER — PREDNISONE 10 MG (21) PO TBPK: ORAL_TABLET | ORAL | 0 refills | Status: DC

## 2020-01-06 NOTE — Assessment & Plan Note (Signed)
Update testosterone, cbc and psa levels.

## 2020-01-06 NOTE — Assessment & Plan Note (Signed)
Increased radicular symptoms into the R arm.  Adding prednisone taper today.  Referral to PT.  If symptoms persist, discussed getting MRI.

## 2020-01-06 NOTE — Patient Instructions (Signed)
Try prednisone for neck pain.  I have ordered PT for the neck and back.  Let me know if not improving with this.  F/u with me in about 8-10 weeks.  Have labs completed.

## 2020-01-06 NOTE — Assessment & Plan Note (Signed)
Update CBC and iron panel. 

## 2020-01-06 NOTE — Progress Notes (Signed)
Justin Ashley - 44 y.o. male MRN [id number]  Date of birth: Mar 03, 1976  Subjective Chief Complaint  Patient presents with  . Back Pain    HPI Justin Ashley is a 44 y.o. male here today for follow up low testosterone.  He also has complaint of neck and low back pain.    Feels good with current dose of testosterone.  No side efects  He has been seeing GI For treatment of H. Pylori.  History of iron deficiency anemia.  He needs to have updated CBC and iron panel.   Neck pain is chronic with recent worsening and radiation into the R arm and hand.   History of DDD on previous xrays.  Denies recent changes to activity or injuries.  He denies weakness, numbness, tingling.    He also has had chronic low back pain.  DJD of SI joint.  No radicular symptoms in lower back.    ROS:  A comprehensive ROS was completed and negative except as noted per HPI  Allergies  Allergen Reactions  . Hydrocodone-Acetaminophen   . Lisinopril     Cough  . Oxycodone-Acetaminophen   . Zoloft [Sertraline Hcl]     Suicide attempt    Past Medical History:  Diagnosis Date  . Dislocation of metatarsophalangeal joint of right great toe   . Erectile dysfunction 12/24/2012   BCBS will not cover Cialis   . Essential hypertension 06/30/2014  . History of thrombocytosis 03/09/2013  . Hypogonadism in male 12/15/2013   Recheck therapy January 2016   . Kidney stone   . Pneumonia age 32  . Sleep apnea    self report  . Suicide attempt ([place]) 12/10/2013    History reviewed. No pertinent surgical history.  Social History   Socioeconomic History  . Marital status: Single    Spouse name: Not on file  . Number of children: Not on file  . Years of education: Not on file  . Highest education level: Not on file  Occupational History  . Not on file  Tobacco Use  . Smoking status: Former Smoker    Years: 22.00    Types: E-cigarettes    Quit date: 03/12/2009    Years since quitting: 10.8  . Smokeless tobacco: Never  Used  Vaping Use  . Vaping Use: Every day  Substance and Sexual Activity  . Alcohol use: No  . Drug use: No  . Sexual activity: Yes  Other Topics Concern  . Not on file  Social History Narrative  . Not on file   Social Determinants of Health   Financial Resource Strain:   . Difficulty of Paying Living Expenses: Not on file  Food Insecurity:   . Worried About Charity fundraiser in the Last Year: Not on file  . Ran Out of Food in the Last Year: Not on file  Transportation Needs:   . Lack of Transportation (Medical): Not on file  . Lack of Transportation (Non-Medical): Not on file  Physical Activity:   . Days of Exercise per Week: Not on file  . Minutes of Exercise per Session: Not on file  Stress:   . Feeling of Stress : Not on file  Social Connections:   . Frequency of Communication with Friends and Family: Not on file  . Frequency of Social Gatherings with Friends and Family: Not on file  . Attends Religious Services: Not on file  . Active Member of Clubs or Organizations: Not on file  . Attends Club or  Organization Meetings: Not on file  . Marital Status: Not on file    Family History  Problem Relation Age of Onset  . Cancer Mother        lung  . Colon polyps Mother   . Cancer Father        stomach, deceased @  36yrs  . Hypertension Father   . Colon cancer Father 16  . Stomach cancer Father   . Other Paternal Uncle        intestional cancer  . Colon cancer Paternal Uncle 13  . Stomach cancer Cousin   . Colon cancer Cousin 23  . Heart attack Cousin   . Esophageal cancer Neg Hx     Health Maintenance  Topic Date Due  . Hepatitis C Screening  Never done  . COLONOSCOPY  10/11/2024  . TETANUS/TDAP  05/02/2026  . INFLUENZA VACCINE  Completed  . COVID-19 Vaccine  Completed  . HIV Screening  Completed      ----------------------------------------------------------------------------------------------------------------------------------------------------------------------------------------------------------------- Physical Exam BP 120/88 (BP Location: Left Arm, Patient Position: Sitting, Cuff Size: Normal)   Pulse 99   Temp 98.2 F (36.8 C)   Wt 181 lb (82.1 kg)   SpO2 100%   BMI 27.52 kg/m   Physical Exam Constitutional:      Appearance: Normal appearance.  Cardiovascular:     Rate and Rhythm: Normal rate and regular rhythm.  Pulmonary:     Effort: Pulmonary effort is normal.     Breath sounds: Normal breath sounds.  Musculoskeletal:     Cervical back: Normal range of motion and neck supple.     Comments: Normal UE strength.    Neurological:     General: No focal deficit present.     Mental Status: He is alert.  Psychiatric:        Mood and Affect: Mood normal.        Behavior: Behavior normal.     ------------------------------------------------------------------------------------------------------------------------------------------------------------------------------------------------------------------- Assessment and Plan  Midline low back pain without sciatica Adding on prednisone taper Referral placed to PT.    Degenerative disc disease, cervical Increased radicular symptoms into the R arm.  Adding prednisone taper today.  Referral to PT.  If symptoms persist, discussed getting MRI.   Hypogonadism in male Update testosterone, cbc and psa levels.   Iron deficiency anemia Update CBC and iron panel.    Meds ordered this encounter  Medications  . predniSONE (STERAPRED UNI-PAK 21 TAB) 10 MG (21) TBPK tablet    Sig: Taper as directed on packaging    Dispense:  21 tablet    Refill:  0    Return in about 10 weeks (around 03/16/2020) for neck/back pain.    This visit occurred during the SARS-CoV-2 public health emergency.  Safety protocols were in place,  including screening questions prior to the visit, additional usage of staff PPE, and extensive cleaning of exam room while observing appropriate contact time as indicated for disinfecting solutions.

## 2020-01-06 NOTE — Assessment & Plan Note (Signed)
Adding on prednisone taper Referral placed to PT.

## 2020-01-11 DIAGNOSIS — Z862 Personal history of diseases of the blood and blood-forming organs and certain disorders involving the immune mechanism: Secondary | ICD-10-CM | POA: Diagnosis not present

## 2020-01-11 DIAGNOSIS — E291 Testicular hypofunction: Secondary | ICD-10-CM | POA: Diagnosis not present

## 2020-01-11 DIAGNOSIS — A048 Other specified bacterial intestinal infections: Secondary | ICD-10-CM | POA: Diagnosis not present

## 2020-01-12 LAB — COMPLETE METABOLIC PANEL WITH GFR
AG Ratio: 1.9 (calc) (ref 1.0–2.5)
ALT: 19 U/L (ref 9–46)
AST: 12 U/L (ref 10–40)
Albumin: 4.4 g/dL (ref 3.6–5.1)
Alkaline phosphatase (APISO): 61 U/L (ref 36–130)
BUN: 11 mg/dL (ref 7–25)
CO2: 26 mmol/L (ref 20–32)
Calcium: 9.7 mg/dL (ref 8.6–10.3)
Chloride: 106 mmol/L (ref 98–110)
Creat: 0.98 mg/dL (ref 0.60–1.35)
GFR, Est African American: 107 mL/min/{1.73_m2} (ref >=60)
GFR, Est Non African American: 92 mL/min/{1.73_m2} (ref >=60)
Globulin: 2.3 g/dL (calc) (ref 1.9–3.7)
Glucose, Bld: 106 mg/dL — ABNORMAL HIGH (ref 65–99)
Potassium: 4.1 mmol/L (ref 3.5–5.3)
Sodium: 138 mmol/L (ref 135–146)
Total Bilirubin: 0.9 mg/dL (ref 0.2–1.2)
Total Protein: 6.7 g/dL (ref 6.1–8.1)

## 2020-01-12 LAB — CBC
HCT: 48.6 % (ref 38.5–50.0)
Hemoglobin: 16 g/dL (ref 13.2–17.1)
MCH: 28.5 pg (ref 27.0–33.0)
MCHC: 32.9 g/dL (ref 32.0–36.0)
MCV: 86.5 fL (ref 80.0–100.0)
MPV: 9.1 fL (ref 7.5–12.5)
Platelets: 480 10*3/uL — ABNORMAL HIGH (ref 140–400)
RBC: 5.62 10*6/uL (ref 4.20–5.80)
RDW: 14.5 % (ref 11.0–15.0)
WBC: 4.5 10*3/uL (ref 3.8–10.8)

## 2020-01-12 LAB — IRON,TIBC AND FERRITIN PANEL
%SAT: 29 % (calc) (ref 20–48)
Ferritin: 11 ng/mL — ABNORMAL LOW (ref 38–380)
Iron: 117 ug/dL (ref 50–180)
TIBC: 410 mcg/dL (calc) (ref 250–425)

## 2020-01-12 LAB — PSA: PSA: 1.88 ng/mL (ref <=4.0)

## 2020-01-12 LAB — TESTOSTERONE: Testosterone: 724 ng/dL (ref 250–827)

## 2020-01-14 ENCOUNTER — Other Ambulatory Visit: Payer: Self-pay | Admitting: Gastroenterology

## 2020-01-14 DIAGNOSIS — K2970 Gastritis, unspecified, without bleeding: Secondary | ICD-10-CM | POA: Diagnosis not present

## 2020-01-14 DIAGNOSIS — F4320 Adjustment disorder, unspecified: Secondary | ICD-10-CM | POA: Diagnosis not present

## 2020-01-14 DIAGNOSIS — B9681 Helicobacter pylori [H. pylori] as the cause of diseases classified elsewhere: Secondary | ICD-10-CM | POA: Diagnosis not present

## 2020-01-15 LAB — HELICOBACTER PYLORI  SPECIAL ANTIGEN
MICRO NUMBER:: 11162512
SPECIMEN QUALITY: ADEQUATE

## 2020-01-18 ENCOUNTER — Other Ambulatory Visit: Payer: Self-pay

## 2020-01-18 ENCOUNTER — Ambulatory Visit (INDEPENDENT_AMBULATORY_CARE_PROVIDER_SITE_OTHER): Payer: BC Managed Care – PPO | Admitting: Rehabilitative and Restorative Service Providers"

## 2020-01-18 DIAGNOSIS — M25511 Pain in right shoulder: Secondary | ICD-10-CM

## 2020-01-18 DIAGNOSIS — R29898 Other symptoms and signs involving the musculoskeletal system: Secondary | ICD-10-CM | POA: Diagnosis not present

## 2020-01-18 DIAGNOSIS — M6281 Muscle weakness (generalized): Secondary | ICD-10-CM

## 2020-01-18 DIAGNOSIS — G8929 Other chronic pain: Secondary | ICD-10-CM

## 2020-01-18 DIAGNOSIS — M542 Cervicalgia: Secondary | ICD-10-CM | POA: Diagnosis not present

## 2020-01-18 NOTE — Therapy (Signed)
[place] [place] [facility] [place] [place], [place], [address] Phone: [phone]   Fax:  [phone]  Physical Therapy Evaluation  Patient Details  Name: Justin Ashley MRN: [id number] Date of Birth: Mar 06, 1976 Referring Provider (PT): Luetta Nutting, DO   Encounter Date: 01/18/2020   PT End of Session - 01/18/20 2137    Visit Number 1    Number of Visits 12    Date for PT Re-Evaluation 02/29/20    Authorization - Visit Number 1    Authorization - Number of Visits 30    PT Start Time 3267    PT Stop Time 1518    PT Time Calculation (min) 42 min    Activity Tolerance Patient tolerated treatment well    Behavior During Therapy [facility] for tasks assessed/performed           Past Medical History:  Diagnosis Date  . Dislocation of metatarsophalangeal joint of right great toe   . Erectile dysfunction 12/24/2012   BCBS will not cover Cialis   . Essential hypertension 06/30/2014  . History of thrombocytosis 03/09/2013  . Hypogonadism in male 12/15/2013   Recheck therapy January 2016   . Kidney stone   . Pneumonia age 1  . Sleep apnea    self report  . Suicide attempt ([place]) 12/10/2013    No past surgical history on file.  There were no vitals filed for this visit.    Subjective Assessment - 01/18/20 1438    Subjective The patient reports neck pain with some tingling extending into the arm.  He also notes shoulder pain.  He also has lower back pain from a fall/injury.  Neck pain is improved by popping his neck.    Pertinent History HTN, h/o panic attacks    Patient Stated Goals Pain to go away or reduce it to 2-3/10    Currently in Pain? Yes    Pain Score 8     Pain Location Neck    Pain Orientation Right    Pain Descriptors / Indicators Aching;Tightness    Pain Type Chronic pain    Pain Radiating Towards always feels R shoulder pain; also has low back pain in referral    Pain Onset More than a month ago    Pain Frequency  Constant    Aggravating Factors  worse at night; morning time is stiff in neck and back    Pain Relieving Factors popping neck    Multiple Pain Sites Yes    Pain Location Back    Pain Orientation Lower    Pain Descriptors / Indicators Aching;Discomfort    Pain Type Chronic pain    Pain Radiating Towards into R thigh    Pain Onset More than a month ago    Pain Frequency Constant    Aggravating Factors  morning and with movement    Pain Relieving Factors unsure    Effect of Pain on Daily Activities accident 2 years ago              [facility] PT Assessment - 01/18/20 1448      Assessment   Medical Diagnosis M54.12 (ICD-10-CM) - Right cervical radiculopathy    Referring Provider (PT) Luetta Nutting, DO    Onset Date/Surgical Date 01/06/20    Hand Dominance Left    Prior Therapy known to our clinic from prior PT      Precautions   Precautions None      Restrictions   Weight Bearing Restrictions No  Balance Screen   Has the patient fallen in the past 6 months No    Has the patient had a decrease in activity level because of a fear of falling?  No    Is the patient reluctant to leave their home because of a fear of falling?  No      Home Social worker Private residence    Living Arrangements Non-relatives/Friends   Girlfriend     Prior Function   Level of Independence Independent      Observation/Other Assessments   Focus on Therapeutic Outcomes (FOTO)  48% limitation      Sensation   Light Touch Impaired Detail    Light Touch Impaired Details Impaired RUE      ROM / Strength   AROM / PROM / Strength AROM;PROM;Strength      AROM   Overall AROM  Deficits    AROM Assessment Site Cervical;Shoulder    Right/Left Shoulder Right;Left    Right Shoulder Flexion 170 Degrees   with pain   Right Shoulder ABduction 160 Degrees   strained sensation on the right   Right Shoulder Internal Rotation --   equal bilat   Right Shoulder External Rotation --    equal bilat   Cervical Flexion 35   painful   Cervical Extension 40    Cervical - Right Side Bend 30    Cervical - Left Side Bend 40    Cervical - Right Rotation 62    Cervical - Left Rotation 55      PROM   Overall PROM  Deficits      Strength   Overall Strength Deficits    Overall Strength Comments painful at rest today*      Flexibility   Soft Tissue Assessment /Muscle Length yes   tightness in pecs     Palpation   Spinal mobility hypomobility CPA thoracic and c-spine    Palpation comment tenderness to palpation parascapular musculature, R scalenes and upper trap      Special Tests    Special Tests Rotator Cuff Impingement    Rotator Cuff Impingment tests Lift- off test      Lift-Off test   Findings Negative    Side Right                      Objective measurements completed on examination: See above findings.       [facility] Adult PT Treatment/Exercise - 01/18/20 1502      Posture/Postural Control   Posture/Postural Control Postural limitations    Postural Limitations Rounded Shoulders;Forward head      Exercises   Exercises Shoulder;Neck      Shoulder Exercises: Prone   Retraction Strengthening;Both;10 reps    Retraction Limitations no pain with movement      Shoulder Exercises: Standing   External Rotation Strengthening;Both;10 reps    Retraction Strengthening;Both;10 reps      Shoulder Exercises: Stretch   Corner Stretch Limitations Door frame stretch pecs                  PT Education - 01/18/20 2136    Education Details HEP    Person(s) Educated Patient    Methods Explanation;Demonstration;Handout    Comprehension Returned demonstration;Verbalized understanding               PT Long Term Goals - 01/18/20 2139      PT LONG TERM GOAL #1   Title The patient will be  indep with HEP.    Time 6    Period Weeks    Target Date 02/29/20      PT LONG TERM GOAL #2   Title The patient will reduce functional limitation per  FOTO from 48% to < or equal to 38%.    Time 6    Period Weeks    Target Date 02/29/20      PT LONG TERM GOAL #3   Title The patient will report resting pain < or equal to 3/10 in R shoulder/ cervical spine.    Baseline 8/10    Time 6    Period Weeks    Target Date 02/29/20      PT LONG TERM GOAL #4   Title The patient will return demo ther ex/HEP for lumbar spine.    Time 6    Period Weeks    Target Date 02/29/20                  Plan - 01/18/20 2220    Clinical Impression Statement The patient is a 44 yo male presenting to OP physical therapy with 5 year h/o R shoulder pain, neck pain, and low back pain. PT's focus today was R upper quadrant assessment.  He has impairments in R parascapular tightness/pain, R shoulder pain, hypomobility thoracic and cervical spine, neck tightness.  Patient has pain limiting ADLs and work activities.    Personal Factors and Comorbidities Comorbidity 1    Comorbidities HTN    Examination-Activity Limitations Reach Overhead;Lift    Examination-Participation Restrictions Occupation;Cleaning    Stability/Clinical Decision Making Stable/Uncomplicated    Clinical Decision Making Low    Rehab Potential Good    PT Frequency 2x / week    PT Duration 6 weeks    PT Treatment/Interventions ADLs/Self Care Home Management;Dry needling;Manual techniques;Therapeutic exercise;Therapeutic activities;Patient/family education;Taping;Moist Heat;Electrical Stimulation;Iontophoresis 4mg /ml Dexamethasone;Passive range of motion    PT Next Visit Plan cervical and thoracic mobilizations, DN upper trap, parascapular musculature, and biceps/deltoid, postural strengthening    PT Home Exercise Plan Access Code: [employer]    Consulted and Agree with Plan of Care Patient           Patient will benefit from skilled therapeutic intervention in order to improve the following deficits and impairments:  Pain, Decreased range of motion, Increased fascial restricitons,  Postural dysfunction, Hypomobility, Impaired flexibility, Decreased strength  Visit Diagnosis: Cervicalgia  Chronic right shoulder pain  Muscle weakness (generalized)  Other symptoms and signs involving the musculoskeletal system     Problem List Patient Active Problem List   Diagnosis Date Noted  . Iron deficiency anemia 01/06/2020  . Midline low back pain without sciatica 07/09/2019  . Prediabetes 01/09/2019  . Migraine 09/09/2018  . H. pylori infection 05/03/2016  . Dyspepsia 05/02/2016  . Palpitations 11/28/2015  . Panic attacks 06/14/2015  . Nicotine vapor product user 10/11/2014  . Essential hypertension 06/30/2014  . Hypogonadism in male 12/15/2013  . History of suicide attempt 12/10/2013  . Degenerative disc disease, cervical 03/09/2013  . History of thrombocytosis 03/09/2013  . Erectile dysfunction 12/24/2012    Cyncere Ruhe, PT 01/18/2020, 10:36 PM  [facility] [place], [place], [address] Phone: [phone]   Fax:  [phone]  Name: Ruth Tully MRN: [id number] Date of Birth: Dec 18, 1975

## 2020-01-18 NOTE — Patient Instructions (Signed)
Access Code: [facility] URL: [email or web address] Date: 01/18/2020 Prepared by: Rudell Cobb  Exercises Prone Scapular Retraction - 2 x daily - 7 x weekly - 1 sets - 10 reps Doorway Pec Stretch at 60 Degrees Abduction with Arm Straight - 2 x daily - 7 x weekly - 1 sets - 2-3 reps - 30 seconds hold Standing Anatomical Position with Scapular Retraction and Depression at Wall - 2 x daily - 7 x weekly - 1 sets - 10 reps Shoulder External Rotation and Scapular Retraction - 2 x daily - 7 x weekly - 1 sets - 10 reps  Patient Education Trigger Point Dry Needling

## 2020-01-21 ENCOUNTER — Encounter: Payer: BC Managed Care – PPO | Admitting: Rehabilitative and Restorative Service Providers"

## 2020-01-25 ENCOUNTER — Other Ambulatory Visit: Payer: Self-pay

## 2020-01-25 ENCOUNTER — Ambulatory Visit (INDEPENDENT_AMBULATORY_CARE_PROVIDER_SITE_OTHER): Payer: BC Managed Care – PPO | Admitting: Rehabilitative and Restorative Service Providers"

## 2020-01-25 DIAGNOSIS — M25511 Pain in right shoulder: Secondary | ICD-10-CM

## 2020-01-25 DIAGNOSIS — M542 Cervicalgia: Secondary | ICD-10-CM

## 2020-01-25 DIAGNOSIS — R29898 Other symptoms and signs involving the musculoskeletal system: Secondary | ICD-10-CM

## 2020-01-25 DIAGNOSIS — M6281 Muscle weakness (generalized): Secondary | ICD-10-CM | POA: Diagnosis not present

## 2020-01-25 DIAGNOSIS — G8929 Other chronic pain: Secondary | ICD-10-CM

## 2020-01-25 NOTE — Therapy (Signed)
[place], [place], [address] Phone: [phone]   Fax:  [phone]  Physical Therapy Treatment  Patient Details  Name: Justin Ashley MRN: [id number] Date of Birth: 28-Jul-1975 Referring Provider (PT): Luetta Nutting, DO   Encounter Date: 01/25/2020   PT End of Session - 01/25/20 1614    Visit Number 2    Number of Visits 12    Date for PT Re-Evaluation 02/29/20    Authorization - Visit Number 2    Authorization - Number of Visits 30    PT Start Time 5498    PT Stop Time 1606    PT Time Calculation (min) 45 min    Activity Tolerance Patient tolerated treatment well    Behavior During Therapy [facility] for tasks assessed/performed           Past Medical History:  Diagnosis Date  . Dislocation of metatarsophalangeal joint of right great toe   . Erectile dysfunction 12/24/2012   BCBS will not cover Cialis   . Essential hypertension 06/30/2014  . History of thrombocytosis 03/09/2013  . Hypogonadism in male 12/15/2013   Recheck therapy January 2016   . Kidney stone   . Pneumonia age 60  . Sleep apnea    self report  . Suicide attempt ([place]) 12/10/2013    No past surgical history on file.  There were no vitals filed for this visit.   Subjective Assessment - 01/25/20 1522    Subjective The patient reports continued neck pain.  He has upper trap pain.    Pertinent History HTN, h/o panic attacks    Patient Stated Goals Pain to go away or reduce it to 2-3/10    Currently in Pain? Yes    Pain Score 8     Pain Location Neck    Pain Orientation Right    Pain Descriptors / Indicators Aching;Tightness    Pain Onset More than a month ago    Pain Frequency Constant    Aggravating Factors  worse at night    Pain Relieving Factors popping neck              [facility] PT Assessment - 01/25/20 1535      Assessment   Medical Diagnosis M54.12 (ICD-10-CM) - Right cervical radiculopathy    Referring  Provider (PT) Luetta Nutting, DO    Onset Date/Surgical Date 01/06/20                         [facility] Adult PT Treatment/Exercise - 01/25/20 1535      Exercises   Exercises Shoulder;Neck      Neck Exercises: Seated   Cervical Rotation Right;Left;5 reps    Lateral Flexion Right;Left    Lateral Flexion Limitations 3 reps for upper trap stretch    Other Seated Exercise cervical flexion/extension x 10 reps      Neck Exercises: Prone   Other Prone Exercise prone on elbows:  neck rotation x 10 reps, cervical flexion/extension x 10 reps; prone with lateral weight shift through elbows for thoracic mobilization      Shoulder Exercises: Prone   Retraction Strengthening;10 reps;Right      Manual Therapy   Manual Therapy Joint mobilization;Soft tissue mobilization;Scapular mobilization    Manual therapy comments to improve mobility and redue pain; skilled assessment and palpation for tissue response to dry needling    Joint Mobilization prone PA mobilization thoracic and cervical spine, supine CPA mobs, R downglides  grade II-III    Soft tissue mobilization parascapular mobilization, upper trap, cervical paraspinals            Trigger Point Dry Needling - 01/25/20 1545    Consent Given? Yes    Education Handout Provided Yes    Muscles Treated Head and Neck Upper trapezius    Muscles Treated Upper Quadrant Teres major;Teres minor;Pectoralis major    Upper Trapezius Response Twitch reponse elicited;Palpable increased muscle length    Teres major Response Palpable increased muscle length    Teres minor Response Palpable increased muscle length                PT Education - 01/25/20 1614    Education Details HEP adding stretch upper trap    Person(s) Educated Patient    Methods Explanation;Demonstration;Handout    Comprehension Verbalized understanding;Returned demonstration               PT Long Term Goals - 01/18/20 2139      PT LONG TERM GOAL #1   Title  The patient will be indep with HEP.    Time 6    Period Weeks    Target Date 02/29/20      PT LONG TERM GOAL #2   Title The patient will reduce functional limitation per FOTO from 48% to < or equal to 38%.    Time 6    Period Weeks    Target Date 02/29/20      PT LONG TERM GOAL #3   Title The patient will report resting pain < or equal to 3/10 in R shoulder/ cervical spine.    Baseline 8/10    Time 6    Period Weeks    Target Date 02/29/20      PT LONG TERM GOAL #4   Title The patient will return demo ther ex/HEP for lumbar spine.    Time 6    Period Weeks    Target Date 02/29/20                 Plan - 01/25/20 1619    Clinical Impression Statement The patient has 3/10 pain at end of session.  He wakes daily with 8/10 pain noting worsening of sxs at night and morning.  PT discussed posture with roundedness and working on/being aware of positioning t/o the day.  PT to continue working to [employer].    PT Treatment/Interventions ADLs/Self Care Home Management;Dry needling;Manual techniques;Therapeutic exercise;Therapeutic activities;Patient/family education;Taping;Moist Heat;Electrical Stimulation;Iontophoresis 4mg /ml Dexamethasone;Passive range of motion;Traction    PT Next Visit Plan STM R upper trap, cervical and thoracic mobilizations, DN prn upper trap, parascapular musculature, and biceps/deltoid, postural strengthening.  *may benefit from cervical traction  *resending cert to add traction    PT Home Exercise Plan Access Code: [facility]    Consulted and Agree with Plan of Care Patient           Patient will benefit from skilled therapeutic intervention in order to improve the following deficits and impairments:     Visit Diagnosis: Cervicalgia  Chronic right shoulder pain  Muscle weakness (generalized)  Other symptoms and signs involving the musculoskeletal system     Problem List Patient Active Problem List   Diagnosis Date Noted  . Iron deficiency anemia  01/06/2020  . Midline low back pain without sciatica 07/09/2019  . Prediabetes 01/09/2019  . Migraine 09/09/2018  . H. pylori infection 05/03/2016  . Dyspepsia 05/02/2016  . Palpitations 11/28/2015  . Panic attacks 06/14/2015  .  Nicotine vapor product user 10/11/2014  . Essential hypertension 06/30/2014  . Hypogonadism in male 12/15/2013  . History of suicide attempt 12/10/2013  . Degenerative disc disease, cervical 03/09/2013  . History of thrombocytosis 03/09/2013  . Erectile dysfunction 12/24/2012    Prajna Vanderpool, PT 01/25/2020, 4:20 PM  [facility] [place], [place], [address] Phone: [phone]   Fax:  [phone]  Name: Justin Ashley MRN: [id number] Date of Birth: 1975/08/12

## 2020-01-25 NOTE — Patient Instructions (Signed)
Access Code: [facility] URL: [email or web address] Date: 01/25/2020 Prepared by: Rudell Cobb  Exercises Prone Scapular Retraction - 2 x daily - 7 x weekly - 1 sets - 10 reps Seated Upper Trapezius Stretch - 2 x daily - 7 x weekly - 1 sets - 5 reps - 30 seconds hold Doorway Pec Stretch at 60 Degrees Abduction with Arm Straight - 2 x daily - 7 x weekly - 1 sets - 2-3 reps - 30 seconds hold Standing Anatomical Position with Scapular Retraction and Depression at Wall - 2 x daily - 7 x weekly - 1 sets - 10 reps Shoulder External Rotation and Scapular Retraction - 2 x daily - 7 x weekly - 1 sets - 10 reps  Patient Education Trigger Point Dry Needling

## 2020-01-28 ENCOUNTER — Other Ambulatory Visit: Payer: Self-pay

## 2020-01-28 ENCOUNTER — Ambulatory Visit (INDEPENDENT_AMBULATORY_CARE_PROVIDER_SITE_OTHER): Payer: BC Managed Care – PPO | Admitting: Physical Therapy

## 2020-01-28 DIAGNOSIS — R29898 Other symptoms and signs involving the musculoskeletal system: Secondary | ICD-10-CM

## 2020-01-28 DIAGNOSIS — G8929 Other chronic pain: Secondary | ICD-10-CM

## 2020-01-28 DIAGNOSIS — M6281 Muscle weakness (generalized): Secondary | ICD-10-CM | POA: Diagnosis not present

## 2020-01-28 DIAGNOSIS — M542 Cervicalgia: Secondary | ICD-10-CM

## 2020-01-28 DIAGNOSIS — M25511 Pain in right shoulder: Secondary | ICD-10-CM

## 2020-01-28 NOTE — Therapy (Signed)
[place], [place], [address] Phone: [phone]   Fax:  [phone]  Physical Therapy Treatment  Patient Details  Name: Justin Ashley MRN: [id number] Date of Birth: March 24, 1975 Referring Provider (PT): Luetta Nutting, DO   Encounter Date: 01/28/2020   PT End of Session - 01/28/20 1534    Visit Number 3    Number of Visits 12    Date for PT Re-Evaluation 02/29/20    Authorization - Visit Number 3    Authorization - Number of Visits 30    PT Start Time 5361    PT Stop Time 1617    PT Time Calculation (min) 43 min    Activity Tolerance Patient tolerated treatment well    Behavior During Therapy [facility] for tasks assessed/performed           Past Medical History:  Diagnosis Date  . Dislocation of metatarsophalangeal joint of right great toe   . Erectile dysfunction 12/24/2012   BCBS will not cover Cialis   . Essential hypertension 06/30/2014  . History of thrombocytosis 03/09/2013  . Hypogonadism in male 12/15/2013   Recheck therapy January 2016   . Kidney stone   . Pneumonia age 44  . Sleep apnea    self report  . Suicide attempt ([place]) 12/10/2013    No past surgical history on file.  There were no vitals filed for this visit.   Subjective Assessment - 01/28/20 1739    Subjective Pt reports his neck pain is a little worse today.  He has been doing his exercises each day.    Patient Stated Goals Pain to go away or reduce it to 2-3/10    Currently in Pain? Yes    Pain Score 7     Pain Location Neck    Pain Orientation Right    Pain Descriptors / Indicators Aching    Aggravating Factors  sitting, laying down at night    Pain Relieving Factors popping neck; standing              [facility] PT Assessment - 01/28/20 0001      Assessment   Medical Diagnosis M54.12 (ICD-10-CM) - Right cervical radiculopathy    Referring Provider (PT) Luetta Nutting, DO    Onset Date/Surgical Date 01/06/20     Next MD Visit 03/18/19             [facility] Adult PT Treatment/Exercise - 01/28/20 0001      Self-Care   Self-Care Other Self-Care Comments    Other Self-Care Comments  pt educated on self massage with ball to pec and periscapular musculature;  pt returned demo with cues.       Neck Exercises: Machines for Strengthening   UBE (Upper Arm Bike) L1: 1 min each direction       Neck Exercises: Prone   Axial Exension 10 reps    Shoulder Extension 5 reps   with scap retraction   Other Prone Exercise scap retraction (without axial ext and shoulder ext), x 10 - cues to relax upper trap and engage lower trap     Other Prone Exercise POE, cervical flex to neutral and diagonals for ROM x 3 each direction.       Shoulder Exercises: Seated   Other Seated Exercises seated on dynadisc for postural awareness (to avoid sacral sitting, losing lumbar curve)       Shoulder Exercises: Prone   Retraction Both;10 reps    Retraction Limitations tactile  cues for lower trap and relax      Shoulder Exercises: Standing   Other Standing Exercises scap retraction and shoulder circles x 5 each - with VC, tactile cues to engage more lower traps.       Shoulder Exercises: Stretch   Other Shoulder Stretches lower doorway stretch x 30 sec x 2;     Other Shoulder Stretches Rt bicep stretch holding counter x 30 sec x 3 reps       Manual Therapy   Soft tissue mobilization IASTM and STM to Rt cervical musculature, upper tra;, levator, pec major, ant/ post deltoid, infraspinatus - to decrease fascial restrictions and improve ROM.       Neck Exercises: Stretches   Upper Trapezius Stretch Right;3 reps;20 seconds   holding table with Rt hand    Levator Stretch Right;1 rep;20 seconds                  PT Education - 01/28/20 1738    Education Details self massage; anatomy to area involved    Person(s) Educated Patient    Methods Explanation;Demonstration    Comprehension Verbalized understanding;Returned  demonstration               PT Long Term Goals - 01/18/20 2139      PT LONG TERM GOAL #1   Title The patient will be indep with HEP.    Time 6    Period Weeks    Target Date 02/29/20      PT LONG TERM GOAL #2   Title The patient will reduce functional limitation per FOTO from 48% to < or equal to 38%.    Time 6    Period Weeks    Target Date 02/29/20      PT LONG TERM GOAL #3   Title The patient will report resting pain < or equal to 3/10 in R shoulder/ cervical spine.    Baseline 8/10    Time 6    Period Weeks    Target Date 02/29/20      PT LONG TERM GOAL #4   Title The patient will return demo ther ex/HEP for lumbar spine.    Time 6    Period Weeks    Target Date 02/29/20                 Plan - 01/28/20 1735    Clinical Impression Statement Pt requires frequent cues on posture and head position throughout session.  Pt reported reduction of Rt neck/upper shoulder pain to 4/10 after stretches and 3/10 after IASTM to area.  Time spent educating pt on musculature involved and how posture is important to improve symptoms.  Pt required cues to quiet his upper traps durng scap retraction.  Progressing towards LTGs.    PT Frequency 2x / week    PT Duration 6 weeks    PT Treatment/Interventions ADLs/Self Care Home Management;Dry needling;Manual techniques;Therapeutic exercise;Therapeutic activities;Patient/family education;Taping;Moist Heat;Electrical Stimulation;Iontophoresis 4mg /ml Dexamethasone;Passive range of motion;Traction    PT Next Visit Plan STM R upper trap, cervical and thoracic mobilizations, DN prn upper trap, parascapular musculature, and biceps/deltoid, postural strengthening.  *may benefit from cervical traction    PT Home Exercise Plan Access Code: [facility]    Consulted and Agree with Plan of Care Patient           Patient will benefit from skilled therapeutic intervention in order to improve the following deficits and impairments:  Pain,  Decreased range of motion, Increased fascial  restricitons, Postural dysfunction, Hypomobility, Impaired flexibility, Decreased strength  Visit Diagnosis: Cervicalgia  Chronic right shoulder pain  Muscle weakness (generalized)  Other symptoms and signs involving the musculoskeletal system     Problem List Patient Active Problem List   Diagnosis Date Noted  . Iron deficiency anemia 01/06/2020  . Midline low back pain without sciatica 07/09/2019  . Prediabetes 01/09/2019  . Migraine 09/09/2018  . H. pylori infection 05/03/2016  . Dyspepsia 05/02/2016  . Palpitations 11/28/2015  . Panic attacks 06/14/2015  . Nicotine vapor product user 10/11/2014  . Essential hypertension 06/30/2014  . Hypogonadism in male 12/15/2013  . History of suicide attempt 12/10/2013  . Degenerative disc disease, cervical 03/09/2013  . History of thrombocytosis 03/09/2013  . Erectile dysfunction 12/24/2012   Kerin Perna, PTA 01/28/20 5:40 PM  [place], [place], [address] Phone: [phone]   Fax:  [phone]  Name: Justin Ashley MRN: [id number] Date of Birth: 01/22/1976

## 2020-02-01 ENCOUNTER — Encounter: Payer: BC Managed Care – PPO | Admitting: Rehabilitative and Restorative Service Providers"

## 2020-02-03 ENCOUNTER — Encounter: Payer: BC Managed Care – PPO | Admitting: Physical Therapy

## 2020-02-08 ENCOUNTER — Encounter: Payer: BC Managed Care – PPO | Admitting: Rehabilitative and Restorative Service Providers"

## 2020-02-12 ENCOUNTER — Ambulatory Visit (INDEPENDENT_AMBULATORY_CARE_PROVIDER_SITE_OTHER): Payer: BC Managed Care – PPO | Admitting: Rehabilitative and Restorative Service Providers"

## 2020-02-12 ENCOUNTER — Other Ambulatory Visit: Payer: Self-pay

## 2020-02-12 ENCOUNTER — Encounter: Payer: Self-pay | Admitting: Rehabilitative and Restorative Service Providers"

## 2020-02-12 DIAGNOSIS — M25511 Pain in right shoulder: Secondary | ICD-10-CM

## 2020-02-12 DIAGNOSIS — G8929 Other chronic pain: Secondary | ICD-10-CM

## 2020-02-12 DIAGNOSIS — M542 Cervicalgia: Secondary | ICD-10-CM | POA: Diagnosis not present

## 2020-02-12 DIAGNOSIS — M6281 Muscle weakness (generalized): Secondary | ICD-10-CM | POA: Diagnosis not present

## 2020-02-12 DIAGNOSIS — R29898 Other symptoms and signs involving the musculoskeletal system: Secondary | ICD-10-CM

## 2020-02-12 NOTE — Therapy (Signed)
[place], [place], [address] Phone: [phone]   Fax:  [phone]  Physical Therapy Treatment  Patient Details  Name: Justin Ashley MRN: [id number] Date of Birth: Apr 04, 1975 Referring Provider (PT): Justin Nutting, DO   Encounter Date: 02/12/2020   PT End of Session - 02/12/20 1457    Visit Number 4    Number of Visits 12    Date for PT Re-Evaluation 02/29/20    Authorization - Visit Number 4    Authorization - Number of Visits 30    PT Start Time 1450    PT Stop Time 1530    PT Time Calculation (min) 40 min    Activity Tolerance Patient tolerated treatment well    Behavior During Therapy [facility] for tasks assessed/performed           Past Medical History:  Diagnosis Date  . Dislocation of metatarsophalangeal joint of right great toe   . Erectile dysfunction 12/24/2012   BCBS will not cover Cialis   . Essential hypertension 06/30/2014  . History of thrombocytosis 03/09/2013  . Hypogonadism in male 12/15/2013   Recheck therapy January 2016   . Kidney stone   . Pneumonia age 44  . Sleep apnea    self report  . Suicide attempt ([place]) 12/10/2013    History reviewed. No pertinent surgical history.  There were no vitals filed for this visit.   Subjective Assessment - 02/12/20 1452    Subjective The patient reports he is not noticing much difference with therapy.  He is not performing HEP and PT and patient discussed need for HEP to help see if therapy interventions help.    Pertinent History HTN, h/o panic attacks    Patient Stated Goals Pain to go away or reduce it to 2-3/10    Currently in Pain? Yes    Pain Score 6     Pain Location Neck    Pain Orientation Right    Pain Descriptors / Indicators Aching;Dull    Pain Type Chronic pain    Pain Onset More than a month ago    Pain Frequency Constant    Aggravating Factors  night time is worst    Pain Relieving Factors popping neck/standing               [facility] PT Assessment - 02/12/20 1459      Assessment   Medical Diagnosis M54.12 (ICD-10-CM) - Right cervical radiculopathy    Referring Provider (PT) Justin Nutting, DO    Onset Date/Surgical Date 01/06/20    Hand Dominance Left    Next MD Visit 03/18/19                         [facility] Adult PT Treatment/Exercise - 02/12/20 1459      Exercises   Exercises Shoulder;Neck;Lumbar      Lumbar Exercises: Stretches   Lower Trunk Rotation 2 reps;30 seconds      Lumbar Exercises: Quadruped   Madcat/Old Horse 10 reps    Madcat/Old Horse Limitations unable to drop into extension      Shoulder Exercises: Standing   External Rotation Strengthening;Both;10 reps    Retraction Strengthening;Both;10 reps    Other Standing Exercises trunk rotation with c-spine rotation during wall lean      Shoulder Exercises: ROM/Strengthening   UBE (Upper Arm Bike) L2 x 2 minutes forward/ 1 minute backward      Modalities   Modalities Traction  Traction   Type of Traction Cervical    Min (lbs) 10    Max (lbs) 15    Hold Time 60    Rest Time 20    Time 15 minutes                  PT Education - 02/12/20 1515    Education Details Modified HEP to be all standing in order to increase compliance at work    [employer]) Educated Patient    Methods Explanation;Demonstration;Handout    Comprehension Verbalized understanding;Returned demonstration               PT Long Term Goals - 02/12/20 1518      PT LONG TERM GOAL #1   Title The patient will be indep with HEP.    Time 6    Period Weeks      PT LONG TERM GOAL #2   Title The patient will reduce functional limitation per FOTO from 48% to < or equal to 38%.    Time 6    Period Weeks      PT LONG TERM GOAL #3   Title The patient will report resting pain < or equal to 3/10 in R shoulder/ cervical spine.    Baseline 6/10 on 12/3/21reduced from 8/10 at eval    Time 6    Period Weeks      PT LONG TERM  GOAL #4   Title The patient will return demo ther ex/HEP for lumbar spine.    Time 6    Period Weeks                 Plan - 02/12/20 1524    Clinical Impression Statement The patient continues with R shoulder pain. PT initiated cervical traction with some relief in pain after session.  Plan to trial 2 more visits and determine if home traction unit [name] from [place] or through [employer]) would be covered.  Plan to progress to patient tolerance.    PT Frequency 2x / week    PT Duration 6 weeks    PT Treatment/Interventions ADLs/Self Care Home Management;Dry needling;Manual techniques;Therapeutic exercise;Therapeutic activities;Patient/family education;Taping;Moist Heat;Electrical Stimulation;Iontophoresis 4mg /ml Dexamethasone;Passive range of motion;Traction    PT Next Visit Plan STM R upper trap, cervical and thoracic mobilizations, DN prn upper trap, parascapular musculature, and biceps/deltoid, postural strengthening.  *may benefit from cervical traction    PT Home Exercise Plan Access Code: [facility]    Consulted and Agree with Plan of Care Patient           Patient will benefit from skilled therapeutic intervention in order to improve the following deficits and impairments:  Pain, Decreased range of motion, Increased fascial restricitons, Postural dysfunction, Hypomobility, Impaired flexibility, Decreased strength  Visit Diagnosis: Cervicalgia  Chronic right shoulder pain  Muscle weakness (generalized)  Other symptoms and signs involving the musculoskeletal system     Problem List Patient Active Problem List   Diagnosis Date Noted  . Iron deficiency anemia 01/06/2020  . Midline low back pain without sciatica 07/09/2019  . Prediabetes 01/09/2019  . Migraine 09/09/2018  . H. pylori infection 05/03/2016  . Dyspepsia 05/02/2016  . Palpitations 11/28/2015  . Panic attacks 06/14/2015  . Nicotine vapor product user 10/11/2014  . Essential hypertension 06/30/2014  .  Hypogonadism in male 12/15/2013  . History of suicide attempt 12/10/2013  . Degenerative disc disease, cervical 03/09/2013  . History of thrombocytosis 03/09/2013  . Erectile dysfunction 12/24/2012    Justin Ashley ,  PT 02/12/2020, 3:42 PM  [facility] [place], [place], [address] Phone: [phone]   Fax:  [phone]  Name: Justin Ashley MRN: [id number] Date of Birth: March 14, 1975

## 2020-02-12 NOTE — Patient Instructions (Signed)
Access Code: [facility] URL: [email or web address] Date: 02/12/2020 Prepared by: Rudell Cobb  Exercises Seated Upper Trapezius Stretch - 2 x daily - 7 x weekly - 1 sets - 5 reps - 30 seconds hold Doorway Pec Stretch at 60 Degrees Abduction with Arm Straight - 2 x daily - 7 x weekly - 1 sets - 2-3 reps - 30 seconds hold Standing Anatomical Position with Scapular Retraction and Depression at Wall - 2 x daily - 7 x weekly - 1 sets - 10 reps Standing with Forearms Thoracic Rotation - 2 x daily - 7 x weekly - 1 sets - 10 reps Standing Backward Shoulder Rolls - 2 x daily - 7 x weekly - 1 sets - 10 reps

## 2020-02-18 ENCOUNTER — Encounter: Payer: BC Managed Care – PPO | Admitting: Rehabilitative and Restorative Service Providers"

## 2020-02-22 ENCOUNTER — Ambulatory Visit (INDEPENDENT_AMBULATORY_CARE_PROVIDER_SITE_OTHER): Payer: BC Managed Care – PPO | Admitting: Rehabilitative and Restorative Service Providers"

## 2020-02-22 ENCOUNTER — Other Ambulatory Visit: Payer: Self-pay

## 2020-02-22 ENCOUNTER — Encounter: Payer: Self-pay | Admitting: Rehabilitative and Restorative Service Providers"

## 2020-02-22 DIAGNOSIS — R29898 Other symptoms and signs involving the musculoskeletal system: Secondary | ICD-10-CM | POA: Diagnosis not present

## 2020-02-22 DIAGNOSIS — M6281 Muscle weakness (generalized): Secondary | ICD-10-CM

## 2020-02-22 DIAGNOSIS — M25511 Pain in right shoulder: Secondary | ICD-10-CM | POA: Diagnosis not present

## 2020-02-22 DIAGNOSIS — M542 Cervicalgia: Secondary | ICD-10-CM

## 2020-02-22 DIAGNOSIS — G8929 Other chronic pain: Secondary | ICD-10-CM

## 2020-02-22 NOTE — Therapy (Addendum)
[place], [place], [address] Phone: [phone]   Fax:  [phone]  Physical Therapy Treatment/Discharge Summary  Patient Details  Name: Justin Ashley MRN: [id number] Date of Birth: 1975/12/29 Referring Provider (PT): Luetta Nutting, DO   Encounter Date: 02/22/2020   PT End of Session - 02/22/20 1505    Visit Number 5    Number of Visits 12    Date for PT Re-Evaluation 02/29/20    Authorization - Visit Number 5    Authorization - Number of Visits 30    PT Start Time 4562    PT Stop Time 1510    PT Time Calculation (min) 33 min    Activity Tolerance Patient tolerated treatment well    Behavior During Therapy [facility] for tasks assessed/performed           Past Medical History:  Diagnosis Date  . Dislocation of metatarsophalangeal joint of right great toe   . Erectile dysfunction 12/24/2012   BCBS will not cover Cialis   . Essential hypertension 06/30/2014  . History of thrombocytosis 03/09/2013  . Hypogonadism in male 12/15/2013   Recheck therapy January 2016   . Kidney stone   . Pneumonia age 26  . Sleep apnea    self report  . Suicide attempt ([place]) 12/10/2013    History reviewed. No pertinent surgical history.  There were no vitals filed for this visit.   Subjective Assessment - 02/22/20 1441    Subjective The patient reports he got some relief from traction.    Pertinent History HTN, h/o panic attacks    Patient Stated Goals Pain to go away or reduce it to 2-3/10    Currently in Pain? Yes    Pain Score 3     Pain Location Neck    Pain Orientation Right   upper trap down to lateral arm (elbow at times)   Pain Descriptors / Indicators Aching;Dull    Pain Type Chronic pain    Pain Onset More than a month ago    Pain Frequency Constant    Aggravating Factors  night time is worst    Pain Relieving Factors popping neck/standing              [facility] PT Assessment - 02/22/20 1443       Assessment   Medical Diagnosis M54.12 (ICD-10-CM) - Right cervical radiculopathy    Referring Provider (PT) Luetta Nutting, DO    Onset Date/Surgical Date 01/06/20    Hand Dominance Left                         [facility] Adult PT Treatment/Exercise - 02/22/20 1443      Self-Care   Self-Care Other Self-Care Comments    Other Self-Care Comments  discussed HEP; the patient is not able to find time in his day to consistently perform; he reports work demands as barrier for HEP compliance.  PT recommended we work to get home traction unit (he has expressed interest) and hold therapy since he is unable to follow through at this time with recommended exercises      Exercises   Exercises Neck      Neck Exercises: Machines for Strengthening   UBE (Upper Arm Bike) L2:  2 minutes forward and 1 minute backwards      Modalities   Modalities Traction      Traction   Type of Traction Cervical    Min (lbs)  10    Max (lbs) 15    Hold Time 60    Rest Time 20    Time 18                       PT Long Term Goals - 02/22/20 1505      PT LONG TERM GOAL #1   Title The patient will be indep with HEP.    Baseline Has HEP, but has not performed.    Time 6    Period Weeks    Status Partially Met      PT LONG TERM GOAL #2   Title The patient will reduce functional limitation per FOTO from 48% to < or equal to 38%.    Time 6    Period Weeks    Status Deferred      PT LONG TERM GOAL #3   Title The patient will report resting pain < or equal to 3/10 in R shoulder/ cervical spine.    Baseline 8/10 at eval, 3/10 today.    Time 6    Period Weeks    Status Achieved      PT LONG TERM GOAL #4   Title The patient will return demo ther ex/HEP for lumbar spine.    Baseline patient does not have time to complete HEP    Time 6    Period Weeks    Status Deferred                 Plan - 02/22/20 1522    Clinical Impression Statement The patient is not able to  participate in HEP regularly due to work demands.  He gets relief with cervical traction.  PT to place on hold-- sent paperwork to [employer] for home traction unit to check on insurance coverage.  Patient to call and schedule if he gets home traction unit and has questions or if he has more time to participate in ther ex.    PT Frequency 2x / week    PT Duration 6 weeks    PT Treatment/Interventions ADLs/Self Care Home Management;Dry needling;Manual techniques;Therapeutic exercise;Therapeutic activities;Patient/family education;Taping;Moist Heat;Electrical Stimulation;Iontophoresis 69m/ml Dexamethasone;Passive range of motion;Traction    PT Next Visit Plan on hold-- ordered home traction unit    PT Home Exercise Plan [facility]: [facility]   Consulted and Agree with Plan of Care Patient           Patient will benefit from skilled therapeutic intervention in order to improve the following deficits and impairments:  Pain,Decreased range of motion,Increased fascial restricitons,Postural dysfunction,Hypomobility,Impaired flexibility,Decreased strength  Visit Diagnosis: Cervicalgia  Chronic right shoulder pain  Muscle weakness (generalized)  Other symptoms and signs involving the musculoskeletal system     Problem List Patient Active Problem List   Diagnosis Date Noted  . Iron deficiency anemia 01/06/2020  . Midline low back pain without sciatica 07/09/2019  . Prediabetes 01/09/2019  . Migraine 09/09/2018  . H. pylori infection 05/03/2016  . Dyspepsia 05/02/2016  . Palpitations 11/28/2015  . Panic attacks 06/14/2015  . Nicotine vapor product user 10/11/2014  . Essential hypertension 06/30/2014  . Hypogonadism in male 12/15/2013  . History of suicide attempt 12/10/2013  . Degenerative disc disease, cervical 03/09/2013  . History of thrombocytosis 03/09/2013  . Erectile dysfunction 12/24/2012    PHYSICAL THERAPY DISCHARGE SUMMARY  Visits from Start of Care: 5  Current  functional level related to goals / functional outcomes: See goals above   Remaining deficits: Still  continues with some pain-- unable to participate in regular HEP due to schedule.   Education / Equipment: HEP  Plan: Patient agrees to discharge.  Patient goals were partially met. Patient is being discharged due to being pleased with the current functional level.  ?????          Thank you for the referral of this patient. Rudell Cobb, MPT  [place], [place] 02/22/2020, 5:06 PM  [facility] [place], [place], [address] Phone: [phone]   Fax:  [phone]  Name: Justin Ashley MRN: [id number] Date of Birth: 1975/11/25

## 2020-02-29 ENCOUNTER — Telehealth: Payer: Self-pay | Admitting: *Deleted

## 2020-02-29 NOTE — Telephone Encounter (Signed)
Chart entered in error

## 2020-03-03 ENCOUNTER — Encounter: Payer: BC Managed Care – PPO | Admitting: Rehabilitative and Restorative Service Providers"

## 2020-03-17 ENCOUNTER — Encounter: Payer: Self-pay | Admitting: Family Medicine

## 2020-03-17 ENCOUNTER — Other Ambulatory Visit: Payer: Self-pay

## 2020-03-17 ENCOUNTER — Ambulatory Visit (INDEPENDENT_AMBULATORY_CARE_PROVIDER_SITE_OTHER): Payer: BC Managed Care – PPO | Admitting: Family Medicine

## 2020-03-17 DIAGNOSIS — M545 Low back pain, unspecified: Secondary | ICD-10-CM

## 2020-03-17 DIAGNOSIS — M5030 Other cervical disc degeneration, unspecified cervical region: Secondary | ICD-10-CM | POA: Diagnosis not present

## 2020-03-17 MED ORDER — TRAMADOL HCL 50 MG PO TABS
50.0000 mg | ORAL_TABLET | Freq: Three times a day (TID) | ORAL | 1 refills | Status: AC | PRN
Start: 2020-03-17 — End: 2020-04-16

## 2020-03-17 MED ORDER — CLONAZEPAM 0.5 MG PO TABS: 0.5000 mg | ORAL_TABLET | Freq: Two times a day (BID) | ORAL | 0 refills | Status: DC | PRN

## 2020-03-17 NOTE — Assessment & Plan Note (Signed)
Neck pain has improved significantly with physical therapy.  He will continue home exercises as needed.

## 2020-03-17 NOTE — Progress Notes (Signed)
Justin Ashley - 45 y.o. male MRN [id number]  Date of birth: 1975/03/20  Subjective No chief complaint on file.   HPI Justin Ashley is a 44 year old male here today for follow-up of back and neck pain.  He has been doing physical therapy which has improved his neck pain however he still continues to have low back pain.  He has had back pain since experiencing an accident on a skateboard.  Previous CT of his spine showed degenerative change of the right SI joint with bridging osteophyte.  Pain located on the lower right side of his lower back and hip area.  His GI doctor advised him to avoid anti-inflammatory medications.  He is taking Tylenol but is not getting good relief with this.  He denies radiation of pain, numbness or tingling.  He is also requesting a refill of clonazepam.  He uses this very sparingly for management of severe anxiety.  His last fill of #30 was over 1 year ago.  ROS:  A comprehensive ROS was completed and negative except as noted per HPI  Allergies  Allergen Reactions  . Hydrocodone-Acetaminophen   . Lisinopril     Cough  . Oxycodone-Acetaminophen   . Zoloft [Sertraline Hcl]     Suicide attempt    Past Medical History:  Diagnosis Date  . Dislocation of metatarsophalangeal joint of right great toe   . Erectile dysfunction 12/24/2012   BCBS will not cover Cialis   . Essential hypertension 06/30/2014  . History of thrombocytosis 03/09/2013  . Hypogonadism in male 12/15/2013   Recheck therapy January 2016   . Kidney stone   . Pneumonia age 75  . Sleep apnea    self report  . Suicide attempt ([place]) 12/10/2013    History reviewed. No pertinent surgical history.  Social History   Socioeconomic History  . Marital status: Single    Spouse name: Not on file  . Number of children: Not on file  . Years of education: Not on file  . Highest education level: Not on file  Occupational History  . Not on file  Tobacco Use  . Smoking status: Former Smoker    Years: 22.00     Types: E-cigarettes    Quit date: 03/12/2009    Years since quitting: 11.0  . Smokeless tobacco: Never Used  Vaping Use  . Vaping Use: Every day  Substance and Sexual Activity  . Alcohol use: No  . Drug use: No  . Sexual activity: Yes  Other Topics Concern  . Not on file  Social History Narrative  . Not on file   Social Determinants of Health   Financial Resource Strain: Not on file  Food Insecurity: Not on file  Transportation Needs: Not on file  Physical Activity: Not on file  Stress: Not on file  Social Connections: Not on file    Family History  Problem Relation Age of Onset  . Cancer Mother        lung  . Colon polyps Mother   . Cancer Father        stomach, deceased @  35yrs  . Hypertension Father   . Colon cancer Father 52  . Stomach cancer Father   . Other Paternal Uncle        intestional cancer  . Colon cancer Paternal Uncle 24  . Stomach cancer Cousin   . Colon cancer Cousin 26  . Heart attack Cousin   . Esophageal cancer Neg Hx     Health Maintenance  Topic Date Due  . Hepatitis C Screening  Never done  . COLONOSCOPY (Pts 45-77yrs Insurance coverage will need to be confirmed)  10/11/2024  . TETANUS/TDAP  05/02/2026  . INFLUENZA VACCINE  Completed  . COVID-19 Vaccine  Completed  . HIV Screening  Completed     ----------------------------------------------------------------------------------------------------------------------------------------------------------------------------------------------------------------- Physical Exam BP 134/82 (BP Location: Left Arm, Patient Position: Sitting, Cuff Size: Normal)   Pulse 89   Temp 98 F (36.7 C)   Wt 182 lb 3.2 oz (82.6 kg)   SpO2 100%   BMI 27.70 kg/m   Physical Exam Constitutional:      Appearance: Normal appearance.  HENT:     Head: Normocephalic and atraumatic.  Eyes:     General: No scleral icterus. Skin:    General: Skin is warm and dry.  Neurological:     General: No focal  deficit present.     Mental Status: He is alert.  Psychiatric:        Mood and Affect: Mood normal.        Behavior: Behavior normal.     ------------------------------------------------------------------------------------------------------------------------------------------------------------------------------------------------------------------- Assessment and Plan  Degenerative disc disease, cervical Neck pain has improved significantly with physical therapy.  He will continue home exercises as needed.  Midline low back pain without sciatica Continues to have low back pain.  He is not getting good relief with Tylenol.  We will add on tramadol as needed for pain control.  Also have him to see Dr. Benjamin Stain for evaluation to see if there is anything he may be able to offer in addition to what we have already tried.   Meds ordered this encounter  Medications  . traMADol (ULTRAM) 50 MG tablet    Sig: Take 1 tablet (50 mg total) by mouth every 8 (eight) hours as needed for severe pain.    Dispense:  45 tablet    Refill:  1  . clonazePAM (KLONOPIN) 0.5 MG tablet    Sig: Take 1 tablet (0.5 mg total) by mouth 2 (two) times daily as needed for anxiety.    Dispense:  30 tablet    Refill:  0    Return in about 6 months (around 09/14/2020) for back/hip pain.    This visit occurred during the SARS-CoV-2 public health emergency.  Safety protocols were in place, including screening questions prior to the visit, additional usage of staff PPE, and extensive cleaning of exam room while observing appropriate contact time as indicated for disinfecting solutions.

## 2020-03-17 NOTE — Assessment & Plan Note (Signed)
Continues to have low back pain.  He is not getting good relief with Tylenol.  We will add on tramadol as needed for pain control.  Also have him to see Dr. Benjamin Stain for evaluation to see if there is anything he may be able to offer in addition to what we have already tried.

## 2020-03-17 NOTE — Patient Instructions (Signed)
Let's try tramadol for pain. See me again in 6-8 weeks.  Schedule to see Dr. Karie Schwalbe as well.

## 2020-03-23 ENCOUNTER — Ambulatory Visit (INDEPENDENT_AMBULATORY_CARE_PROVIDER_SITE_OTHER): Payer: BC Managed Care – PPO

## 2020-03-23 ENCOUNTER — Other Ambulatory Visit: Payer: Self-pay

## 2020-03-23 ENCOUNTER — Ambulatory Visit (INDEPENDENT_AMBULATORY_CARE_PROVIDER_SITE_OTHER): Payer: BC Managed Care – PPO | Admitting: Sports Medicine

## 2020-03-23 DIAGNOSIS — M5030 Other cervical disc degeneration, unspecified cervical region: Secondary | ICD-10-CM

## 2020-03-23 DIAGNOSIS — M50321 Other cervical disc degeneration at C4-C5 level: Secondary | ICD-10-CM | POA: Diagnosis not present

## 2020-03-23 DIAGNOSIS — E291 Testicular hypofunction: Secondary | ICD-10-CM | POA: Diagnosis not present

## 2020-03-23 DIAGNOSIS — M542 Cervicalgia: Secondary | ICD-10-CM | POA: Diagnosis not present

## 2020-03-23 IMAGING — DX DG CERVICAL SPINE COMPLETE 4+V
6 series · 7 of 7 positions shown · non-contrast
Comparison: [DATE]

CLINICAL DATA: Chronic neck pain, stiffness

EXAM:
CERVICAL SPINE - COMPLETE 4+ VIEW

[c-spine lat]
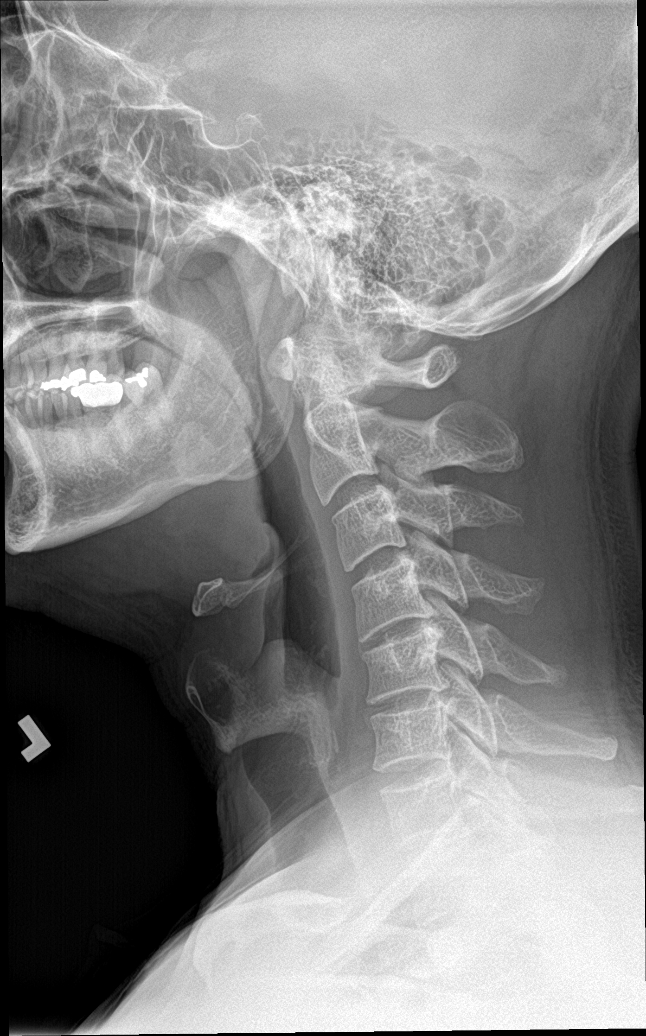

[c-spine obl (1 of 2)]
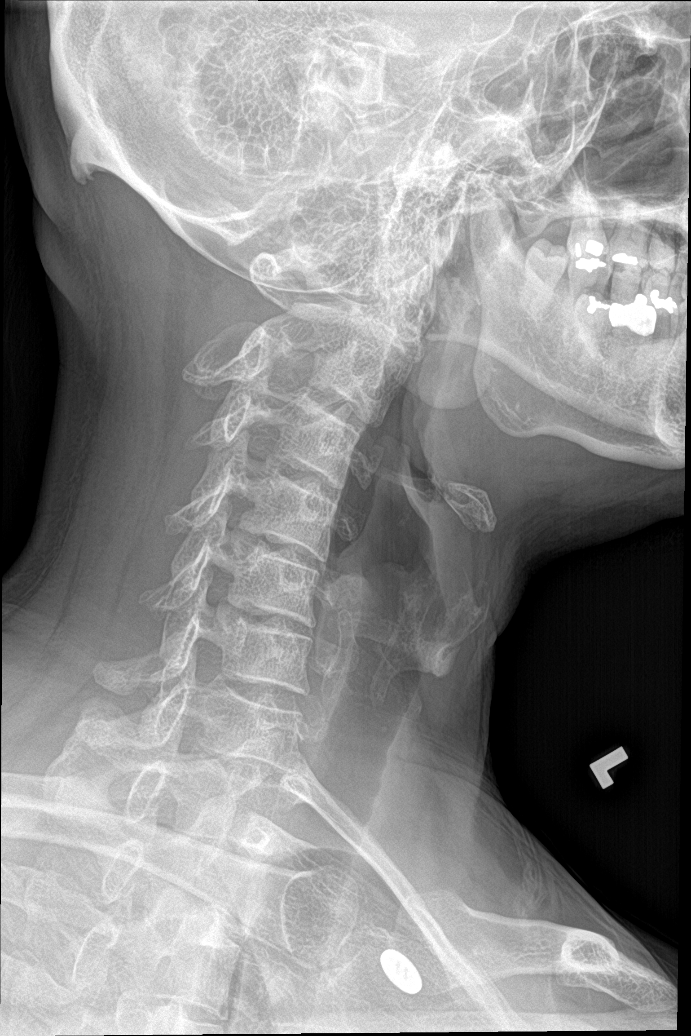

[c-spine obl (2 of 2)]
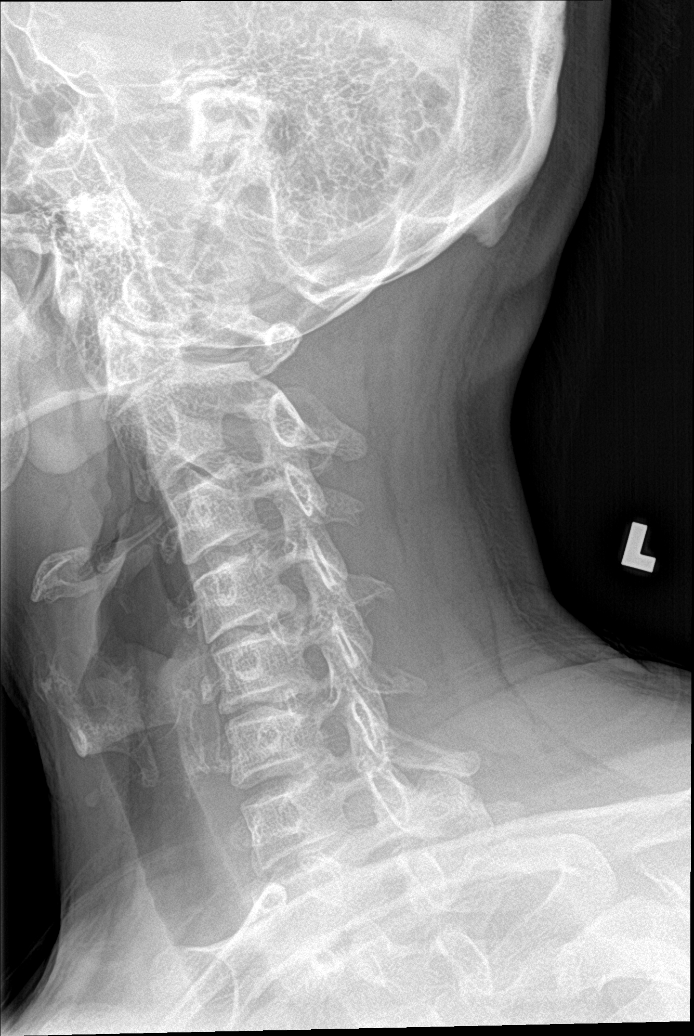

[c-spine ap]
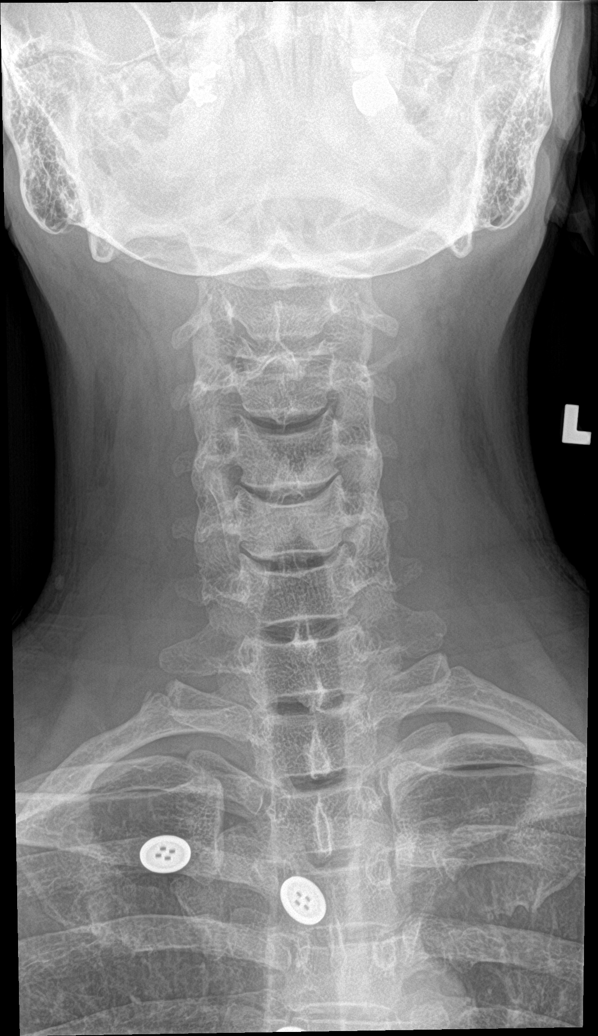

[Series 5: c-spine open mouth · 0.14mm/px · 2 of 2 slices shown]
[im 1/2]
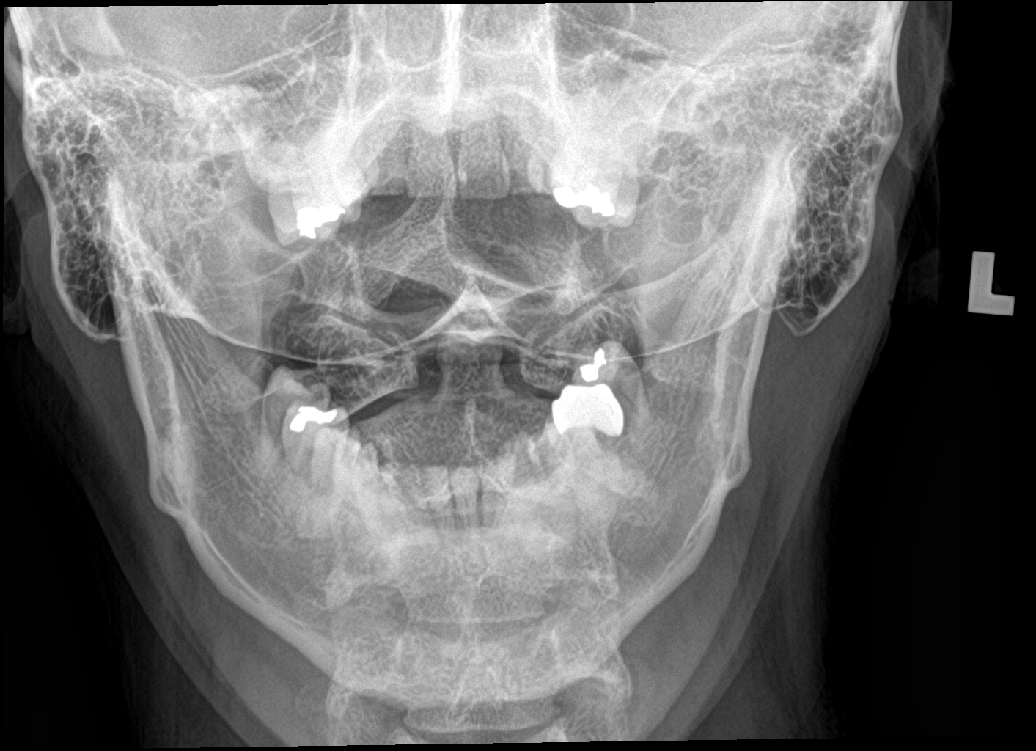
[im 2/2]
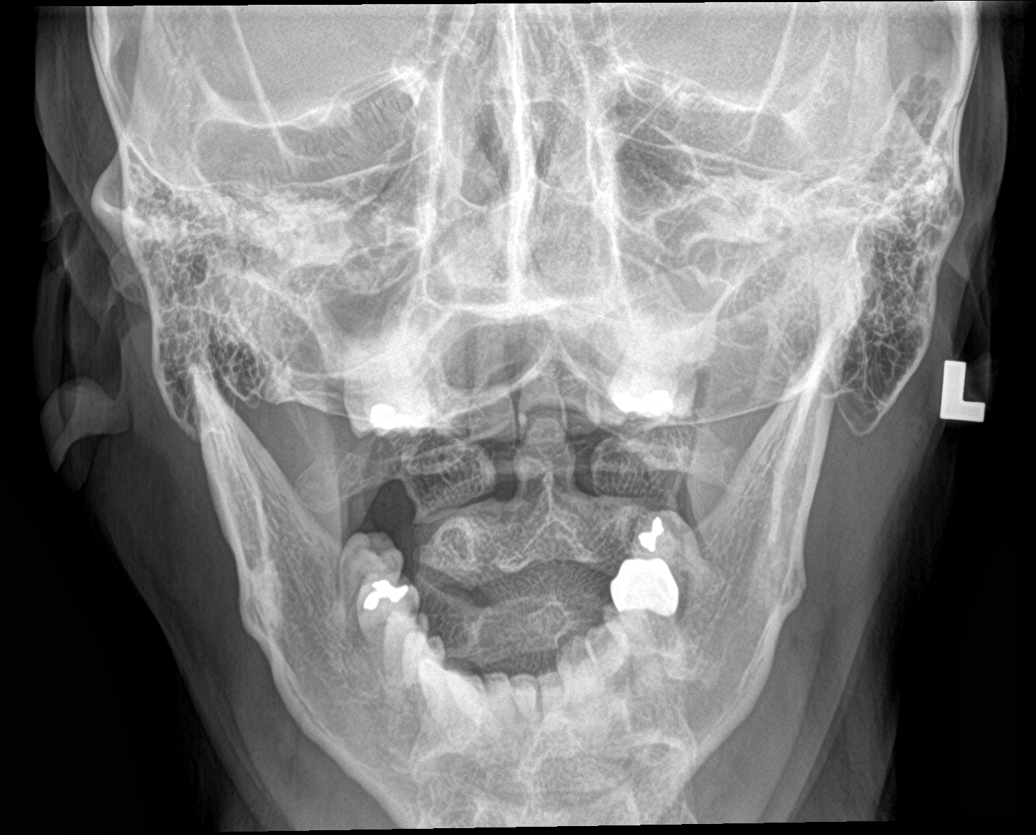

[c-spine swimmers]
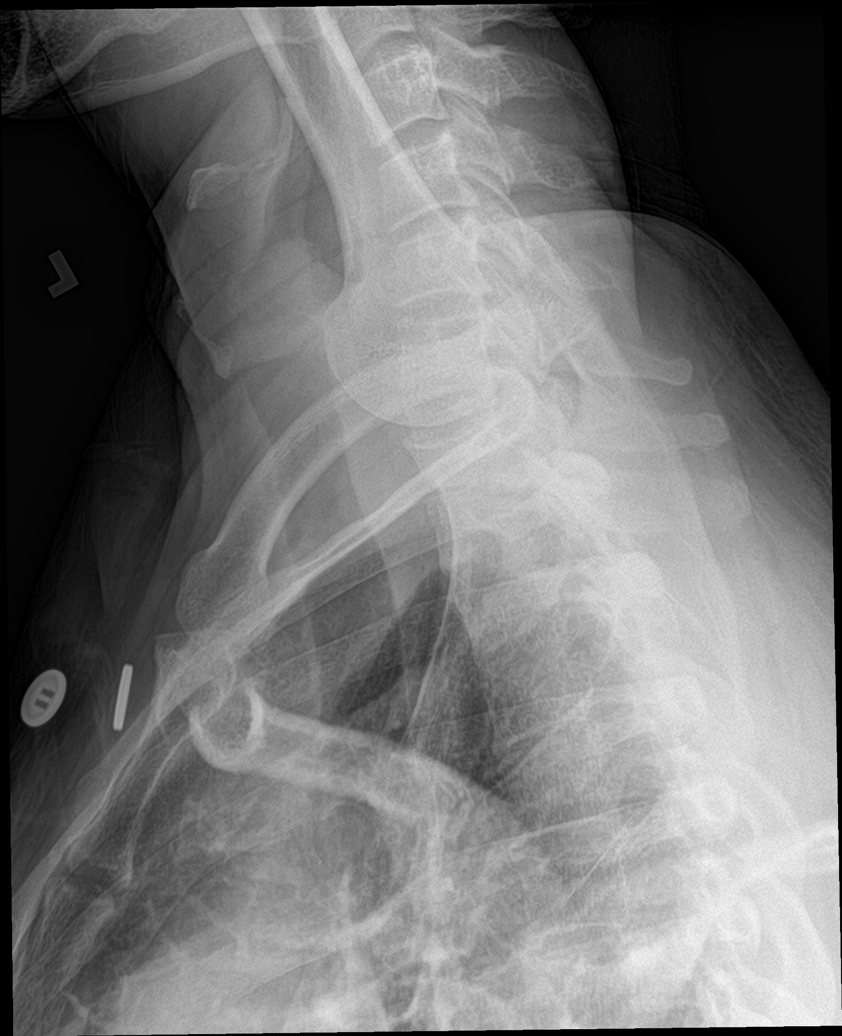

[7 of 7 positions shown; findings below may reference images not displayed]

FINDINGS: Loss of cervical lordosis. No subluxation. Mild bilateral neural
foraminal narrowing at C4-5 due to uncovertebral spurring. Early
disc space narrowing at C4-5. Prevertebral soft tissues are normal.
No fracture.
IMPRESSION: Bilateral C4-5 neural foraminal narrowing due to uncovertebral
spurring. Early degenerative disc disease at C4-5.

Loss of cervical lordosis.

## 2020-03-23 MED ORDER — PREDNISONE 10 MG (48) PO TBPK: ORAL_TABLET | Freq: Every day | ORAL | 0 refills | Status: DC

## 2020-03-23 MED ORDER — GABAPENTIN 300 MG PO CAPS: ORAL_CAPSULE | ORAL | 3 refills | Status: DC

## 2020-03-23 MED ORDER — TESTOSTERONE CYPIONATE 200 MG/ML IM SOLN: 300.0000 mg | INTRAMUSCULAR | 3 refills | Status: DC

## 2020-03-23 MED ORDER — TADALAFIL 20 MG PO TABS
10.0000 mg | ORAL_TABLET | ORAL | 11 refills | Status: AC | PRN
Start: 2020-03-23 — End: ?

## 2020-03-23 NOTE — Assessment & Plan Note (Signed)
Recurrence of right-sided cervical radicular symptoms in the C5 distribution. He has done physical therapy, steroids, NSAIDs without much improvement. Now having recurrence of pain. Proceeding with MRI for epidural planning, will be right C6-C7. For insurance requirements we are going to get an updated x-ray. Adding gabapentin. Return to see me for MRI results

## 2020-03-23 NOTE — Progress Notes (Signed)
    Procedures performed today:    None.  Independent interpretation of notes and tests performed by another provider:   None.  Brief History, Exam, Impression, and Recommendations:    Degenerative disc disease, cervical Recurrence of right-sided cervical radicular symptoms in the C5 distribution. He has done physical therapy, steroids, NSAIDs without much improvement. Now having recurrence of pain. Proceeding with MRI for epidural planning, will be right C6-C7. For insurance requirements we are going to get an updated x-ray. Adding gabapentin. Return to see me for MRI results    ___________________________________________ Gwen Her. Dianah Field, M.D., ABFM., CAQSM. Primary Care and [place] Instructor of [place] of [facility]

## 2020-03-24 ENCOUNTER — Other Ambulatory Visit: Payer: Self-pay | Admitting: Family Medicine

## 2020-03-24 DIAGNOSIS — E291 Testicular hypofunction: Secondary | ICD-10-CM

## 2020-03-24 MED ORDER — TESTOSTERONE CYPIONATE 200 MG/ML IM SOLN: 300.0000 mg | INTRAMUSCULAR | 3 refills | Status: DC

## 2020-03-24 NOTE — Telephone Encounter (Signed)
To PCP since this is for his medical stuffs.

## 2020-03-28 ENCOUNTER — Other Ambulatory Visit: Payer: Self-pay

## 2020-03-28 ENCOUNTER — Other Ambulatory Visit: Payer: BC Managed Care – PPO

## 2020-03-28 NOTE — Progress Notes (Signed)
Opened in error

## 2020-03-30 ENCOUNTER — Telehealth: Payer: Self-pay

## 2020-03-30 NOTE — Telephone Encounter (Signed)
PA for testosterone submitted to insurance on 03/28/20. Pending determination.   KEY: [id number]

## 2020-03-30 NOTE — Telephone Encounter (Addendum)
Prior authorization for testosterone denied by the insurance. [place] has been updated. Per the pharmacy, pt was able to get the rx with a discount card.

## 2020-03-31 MED ORDER — LIDOCAINE 5 % EX PTCH: 1.0000 | MEDICATED_PATCH | CUTANEOUS | 0 refills | Status: DC

## 2020-03-31 NOTE — Telephone Encounter (Signed)
Order for patch pended, sign if appropriate.

## 2020-04-03 ENCOUNTER — Ambulatory Visit (INDEPENDENT_AMBULATORY_CARE_PROVIDER_SITE_OTHER): Payer: BC Managed Care – PPO

## 2020-04-03 ENCOUNTER — Other Ambulatory Visit: Payer: Self-pay

## 2020-04-03 DIAGNOSIS — M50322 Other cervical disc degeneration at C5-C6 level: Secondary | ICD-10-CM | POA: Diagnosis not present

## 2020-04-03 DIAGNOSIS — M50321 Other cervical disc degeneration at C4-C5 level: Secondary | ICD-10-CM

## 2020-04-03 DIAGNOSIS — M4802 Spinal stenosis, cervical region: Secondary | ICD-10-CM | POA: Diagnosis not present

## 2020-04-03 DIAGNOSIS — M542 Cervicalgia: Secondary | ICD-10-CM | POA: Diagnosis not present

## 2020-04-03 DIAGNOSIS — M5030 Other cervical disc degeneration, unspecified cervical region: Secondary | ICD-10-CM

## 2020-04-03 IMAGING — MR MR CERVICAL SPINE W/O CM
5 series · 40 of 48 positions shown · non-contrast
Comparison: CT [DATE], x-ray [DATE]

CLINICAL DATA: Neck pain with right upper extremity radicular
symptoms

EXAM:
MRI CERVICAL SPINE WITHOUT CONTRAST
TECHNIQUE: Multiplanar, multisequence MR imaging of the cervical spine was
performed. No intravenous contrast was administered.

[Series 4: T2 · sagittal · 3.0mm · 0.69mm/px · 6 of 13 slices shown (1 of 2)]
[im 1/13]
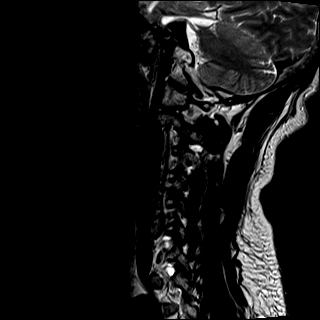
[im 3/13]
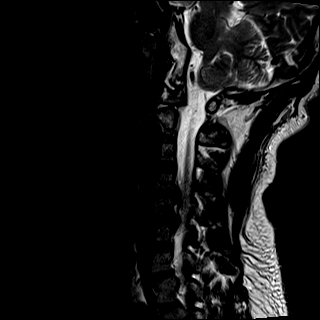
[im 5/13]
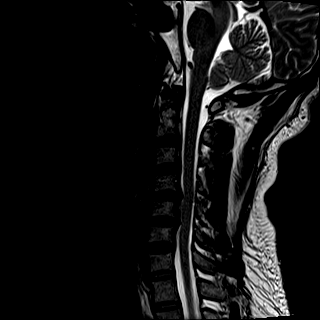
[im 8/13]
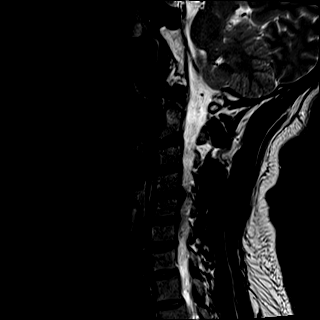
[im 10/13]
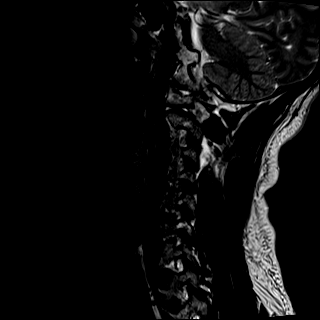
[im 13/13]
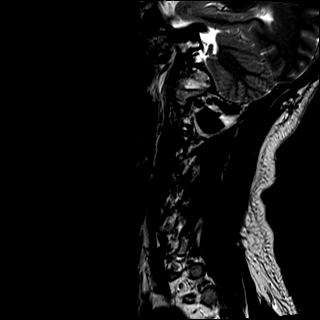

[Series 5: T1 · sagittal · 3.0mm · 0.86mm/px · 5 of 13 slices shown]
[im 1/13]
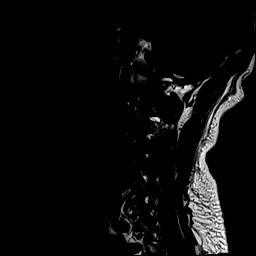
[im 4/13]
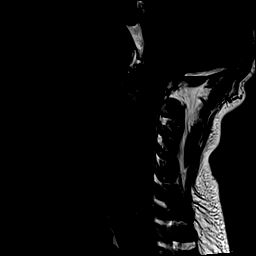
[im 7/13]
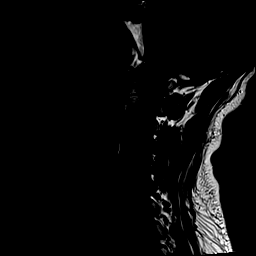
[im 10/13]
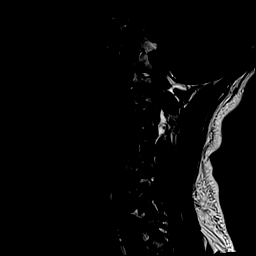
[im 13/13]
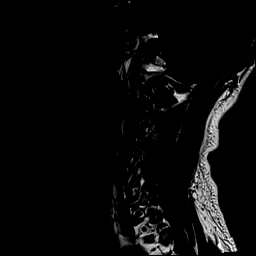

[Series 6: STIR · sagittal · 3.0mm · 0.69mm/px · 5 of 13 slices shown]
[im 1/13]
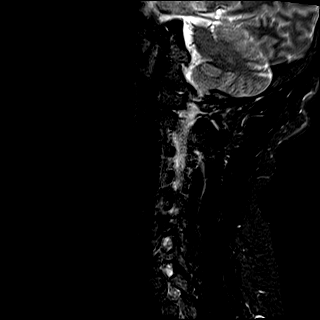
[im 4/13]
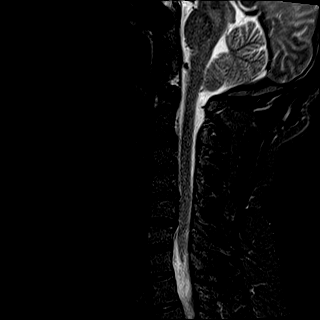
[im 7/13]
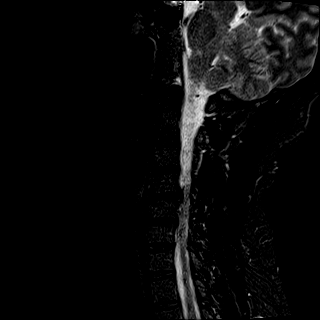
[im 10/13]
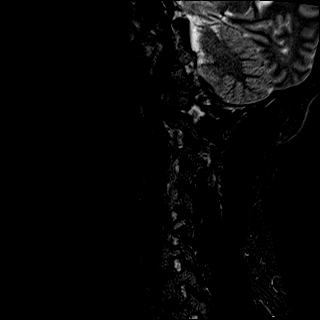
[im 13/13]
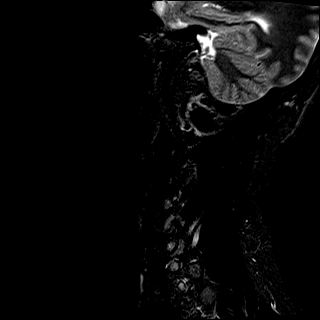

[Series 7: T2 · axial · 3.0mm · 0.62mm/px · z∈[-110,+29]mm · 16 of 38 slices shown (2 of 2)]
[im 1/38]
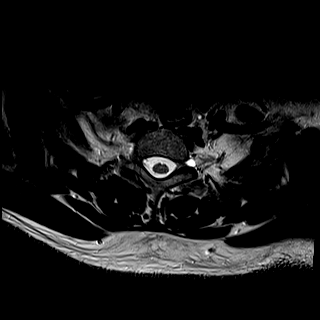
[im 3/38]
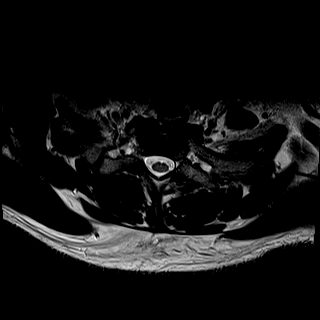
[im 5/38]
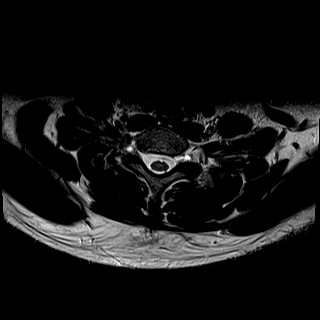
[im 8/38]
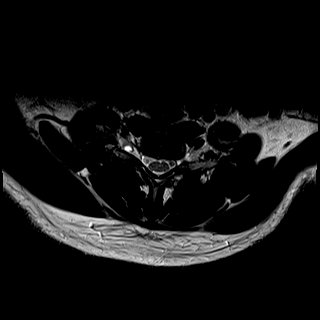
[im 10/38]
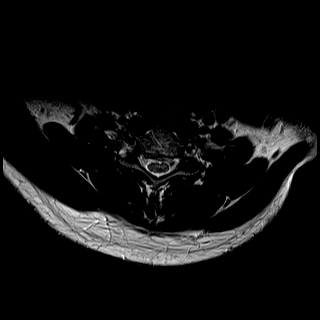
[im 13/38]
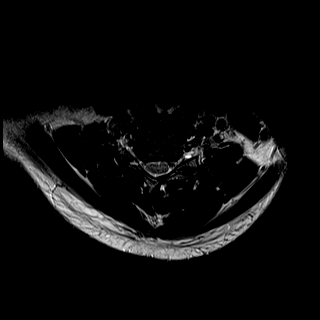
[im 15/38]
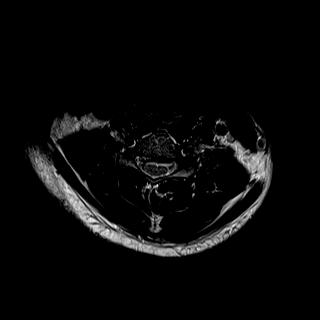
[im 18/38]
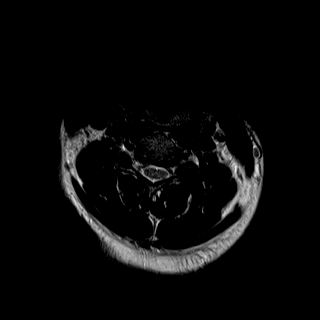
[im 20/38]
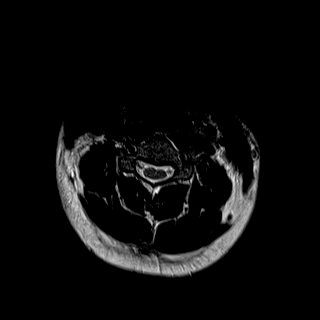
[im 23/38]
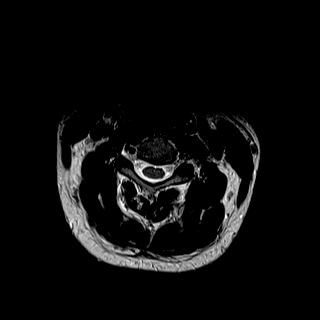
[im 25/38]
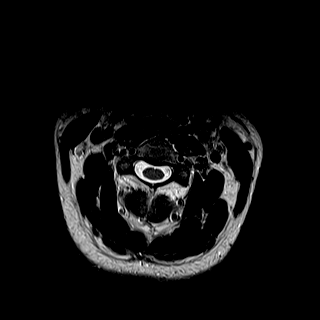
[im 28/38]
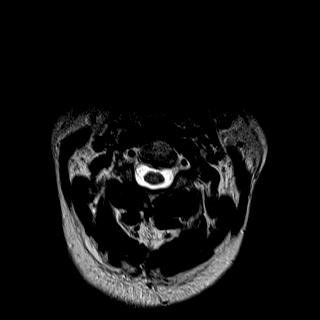
[im 30/38]
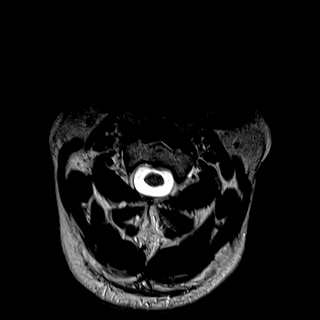
[im 33/38]
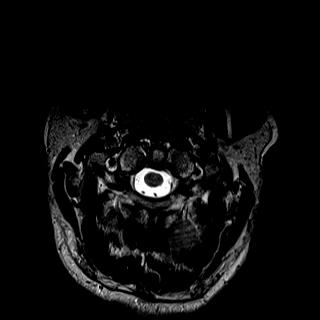
[im 35/38]
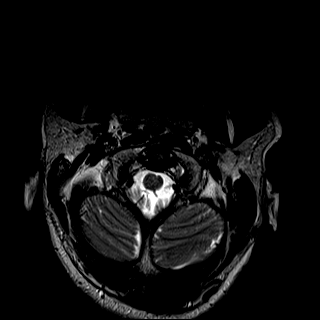
[im 38/38]
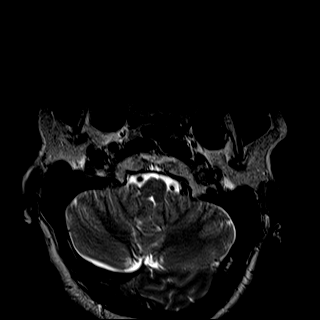

[Series 8: mpgr ax · axial · 3.0mm · 0.35mm/px · z∈[-94,+38]mm · 8 of 38 slices shown]
[im 3/38]
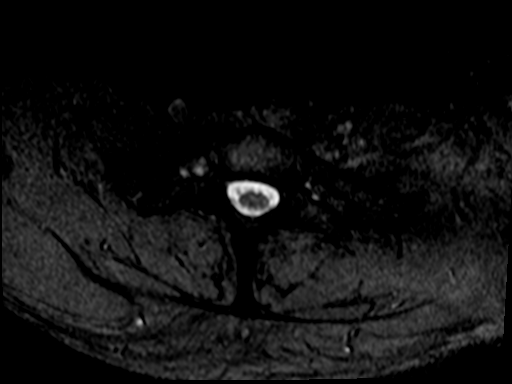
[im 8/38]
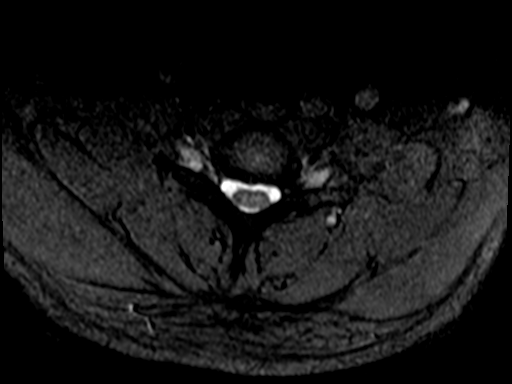
[im 13/38]
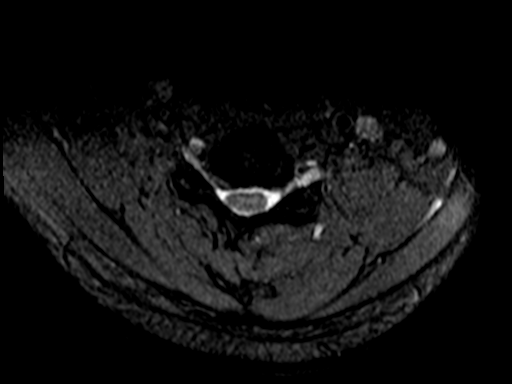
[im 18/38]
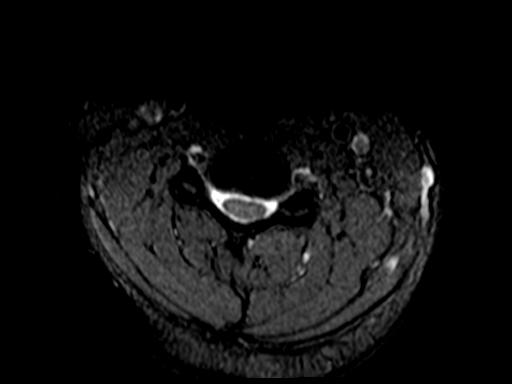
[im 23/38]
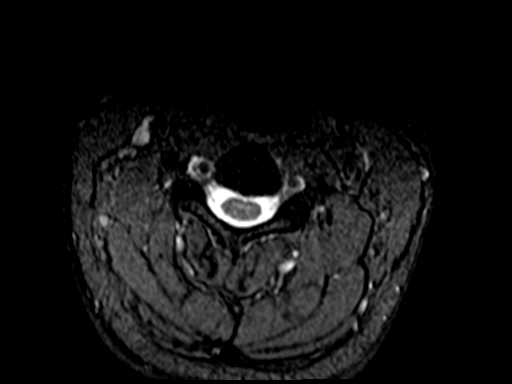
[im 28/38]
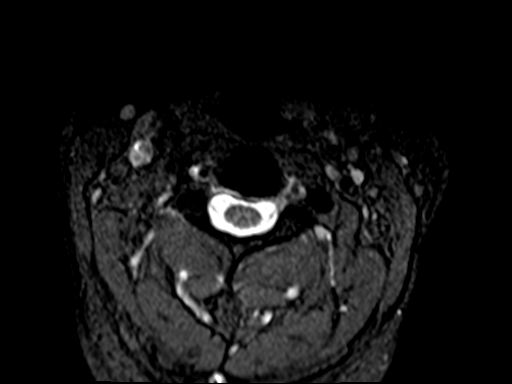
[im 33/38]
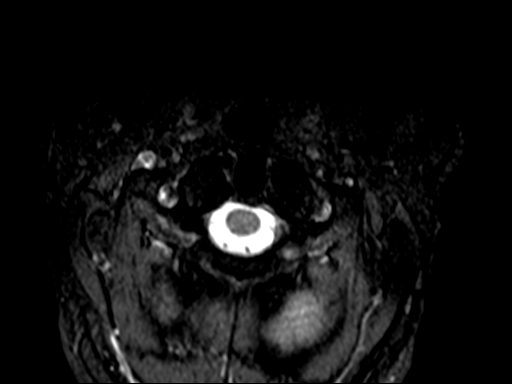
[im 38/38]
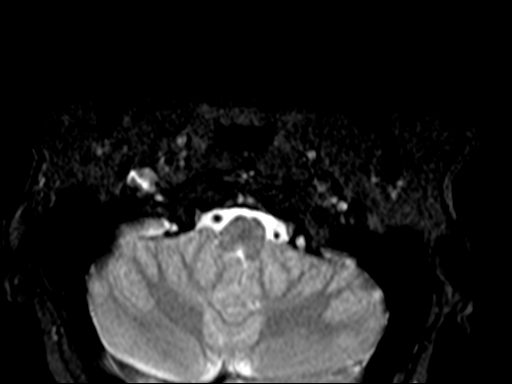

[40 of 48 positions shown; findings below may reference images not displayed]

FINDINGS: Alignment: Straightening of the cervical lordosis without static
listhesis.

Vertebrae: No fracture, evidence of discitis, or bone lesion. Mild
diffuse intrinsic canal narrowing on the basis of congenitally short
pedicles.

Cord: Normal signal and morphology.

Posterior Fossa, vertebral arteries, paraspinal tissues: Negative.

Disc levels:

C2-C3: No significant disc protrusion, foraminal stenosis, or canal
stenosis.

C3-C4: No significant disc protrusion, foraminal stenosis, or canal
stenosis.

C4-C5: Posterior disc osteophyte complex and bilateral uncovertebral
hypertrophy. Moderate bilateral foraminal stenosis, left slightly
worse than right. Moderate canal stenosis.

C5-C6: Disc osteophyte complex with right paracentral disc
protrusion resulting in impress upon the right hemicord. Moderate to
severe right foraminal stenosis. Mild left foraminal stenosis.
Mild-to-moderate canal stenosis.

C6-C7: Minimal disc osteophyte complex without foraminal or canal
stenosis.

C7-T1: No significant disc protrusion, foraminal stenosis, or canal
stenosis.
IMPRESSION: 1. Degenerative disc disease of the mid cervical spine superimposed
on a congenitally narrowed canal. Findings most pronounced at the
C4-5 and C5-6 levels.
2. Moderate canal stenosis and moderate bilateral foraminal stenosis
at C4-5.
3. Mild-to-moderate canal stenosis with moderate-to-severe right
foraminal stenosis and mild left foraminal stenosis at C5-6.

## 2020-04-29 ENCOUNTER — Ambulatory Visit: Payer: BC Managed Care – PPO | Admitting: Sports Medicine

## 2020-04-29 ENCOUNTER — Other Ambulatory Visit: Payer: Self-pay

## 2020-04-29 DIAGNOSIS — M5030 Other cervical disc degeneration, unspecified cervical region: Secondary | ICD-10-CM

## 2020-04-29 MED ORDER — PREGABALIN 50 MG PO CAPS: ORAL_CAPSULE | ORAL | 3 refills | Status: DC

## 2020-04-29 NOTE — Assessment & Plan Note (Signed)
Multilevel cervical DDD with right-sided radicular symptoms in a C5 distribution, C6 distribution. At this point has not responded to physical therapy, steroids, NSAIDs. Proceeding with right-sided C6-C7 interlaminar epidural, gabapentin creates excessive cognitive deficits so we will switch to Lyrica. Tramadol did create a rash.

## 2020-04-29 NOTE — Progress Notes (Signed)
    Procedures performed today:    None.  Independent interpretation of notes and tests performed by another provider:   None.  Brief History, Exam, Impression, and Recommendations:    Degenerative disc disease, cervical Multilevel cervical DDD with right-sided radicular symptoms in a C5 distribution, C6 distribution. At this point has not responded to physical therapy, steroids, NSAIDs. Proceeding with right-sided C6-C7 interlaminar epidural, gabapentin creates excessive cognitive deficits so we will switch to Lyrica. Tramadol did create a rash.    ___________________________________________ Gwen Her. Dianah Field, M.D., ABFM., CAQSM. Primary Care and [place] Instructor of [place] of [facility]

## 2020-05-03 ENCOUNTER — Ambulatory Visit
Admission: RE | Admit: 2020-05-03 | Discharge: 2020-05-03 | Disposition: A | Discharge status: Home / Self Care | Payer: BC Managed Care – PPO | Source: Ambulatory Visit | Attending: Sports Medicine | Admitting: Sports Medicine

## 2020-05-03 ENCOUNTER — Other Ambulatory Visit: Payer: Self-pay

## 2020-05-03 DIAGNOSIS — M5030 Other cervical disc degeneration, unspecified cervical region: Secondary | ICD-10-CM

## 2020-05-03 DIAGNOSIS — M47812 Spondylosis without myelopathy or radiculopathy, cervical region: Secondary | ICD-10-CM | POA: Diagnosis not present

## 2020-05-03 IMAGING — XA DG INJECT/[PERSON_NAME] INC NEEDLE/CATH/PLC EPI/CERV/THOR W/IMG
2 series · 2 of 2 positions shown · non-contrast
Comparison: none

CLINICAL DATA: Cervical spondylosis without myelopathy. Right
shoulder and arm pain.

[Series 1: ortho adipose · 1 of 1 slices shown (1 of 2)]
[im 1/1]
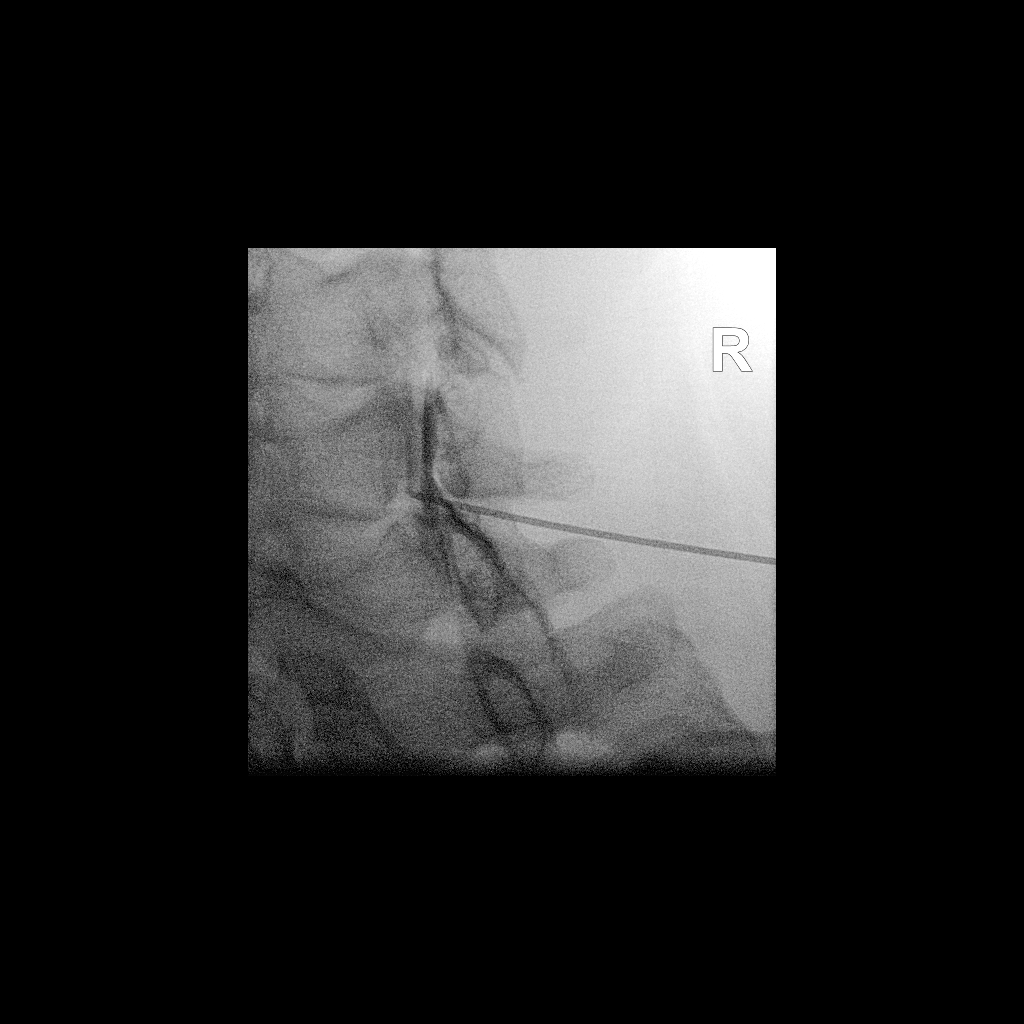

[Series 2: ortho adipose · 1 of 1 slices shown (2 of 2)]
[im 1/1]
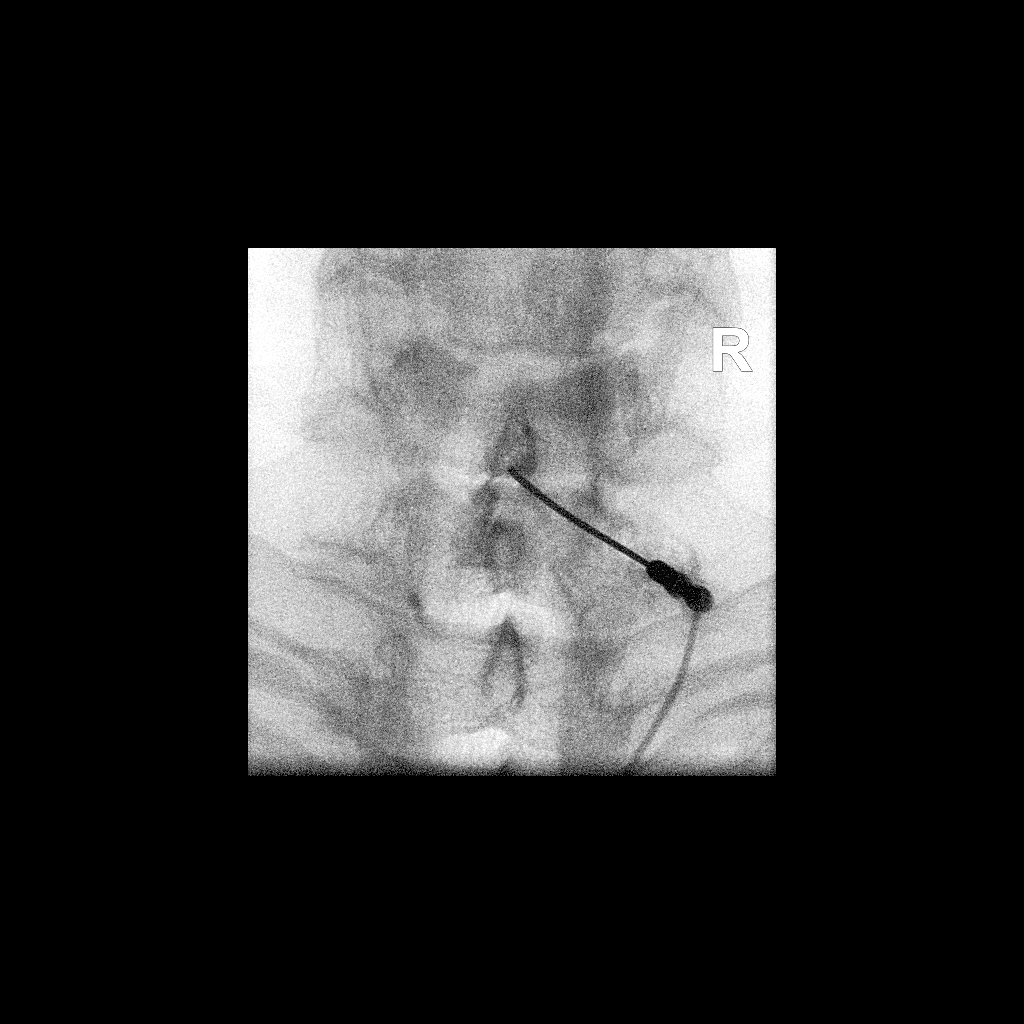

[2 of 2 positions shown; findings below may reference images not displayed]

FLUOROSCOPY TIME:  Fluoroscopy Time: 29 seconds

Radiation Exposure Index: 8.23 microGray*m^2

PROCEDURE:
The procedure, risks, benefits, and alternatives were explained to
the patient. Questions regarding the procedure were encouraged and
answered. The patient understands and consents to the procedure.

CERVICAL EPIDURAL INJECTION

An interlaminar approach was performed on the right at C6-7. A
inch 20 gauge epidural needle was advanced using loss-of-resistance
technique.

DIAGNOSTIC EPIDURAL INJECTION

Injection of Isovue-M 300 shows a good epidural pattern with spread
above and below the level of needle placement, primarily on the
right. No vascular opacification is seen.

THERAPEUTIC EPIDURAL INJECTION

1.5 ml of Kenalog 40 mixed with 2 ml of normal saline were then
instilled. The procedure was well-tolerated, and the patient was
discharged thirty minutes following the injection in good condition.
IMPRESSION: Technically successful interlaminar epidural injection on the right
at C6-7.

## 2020-05-03 MED ORDER — IOPAMIDOL (ISOVUE-M 300) INJECTION 61%
1.0000 mL | Freq: Once | INTRAMUSCULAR | Status: AC
  Administered 2020-05-03: 1 mL via EPIDURAL

## 2020-05-03 MED ORDER — TRIAMCINOLONE ACETONIDE 40 MG/ML IJ SUSP (RADIOLOGY)
60.0000 mg | Freq: Once | INTRAMUSCULAR | Status: AC
  Administered 2020-05-03: 60 mg via EPIDURAL

## 2020-05-03 MED ORDER — IOPAMIDOL (ISOVUE-M 200) INJECTION 41%: 13.0000 mL | Freq: Once | INTRAMUSCULAR | Status: DC

## 2020-05-03 NOTE — Discharge Instructions (Signed)

## 2020-05-16 DIAGNOSIS — M5030 Other cervical disc degeneration, unspecified cervical region: Secondary | ICD-10-CM

## 2020-05-17 ENCOUNTER — Other Ambulatory Visit: Payer: Self-pay | Admitting: Family Medicine

## 2020-05-17 MED ORDER — ALBUTEROL SULFATE HFA 108 (90 BASE) MCG/ACT IN AERS: 2.0000 | INHALATION_SPRAY | Freq: Four times a day (QID) | RESPIRATORY_TRACT | 1 refills | Status: DC | PRN

## 2020-05-17 NOTE — Telephone Encounter (Signed)
Looks like he is requesting a refill on albuterol, routing to you [place] and has not seen this on his med list.

## 2020-05-23 ENCOUNTER — Ambulatory Visit
Admission: RE | Admit: 2020-05-23 | Discharge: 2020-05-23 | Disposition: A | Discharge status: Home / Self Care | Payer: BC Managed Care – PPO | Source: Ambulatory Visit | Attending: Sports Medicine | Admitting: Sports Medicine

## 2020-05-23 DIAGNOSIS — M5030 Other cervical disc degeneration, unspecified cervical region: Secondary | ICD-10-CM

## 2020-05-23 DIAGNOSIS — M50221 Other cervical disc displacement at C4-C5 level: Secondary | ICD-10-CM | POA: Diagnosis not present

## 2020-05-23 DIAGNOSIS — M50222 Other cervical disc displacement at C5-C6 level: Secondary | ICD-10-CM | POA: Diagnosis not present

## 2020-05-23 IMAGING — XA DG INJECT/[PERSON_NAME] INC NEEDLE/CATH/PLC EPI/CERV/THOR W/IMG
2 series · 2 of 2 positions shown · non-contrast
Comparison: none

CLINICAL DATA: Degenerative disc disease, cervical. Right upper
extremity radiculopathy. Displacement of the C4-5 and C5-6 cervical
disc. Patient reports 50% relief of pain after the prior injection.
Pain continues in the upper extremities bilaterally, right greater
than left.

[Series 1: ortho standard · 1 of 1 slices shown (1 of 2)]
[im 1/1]
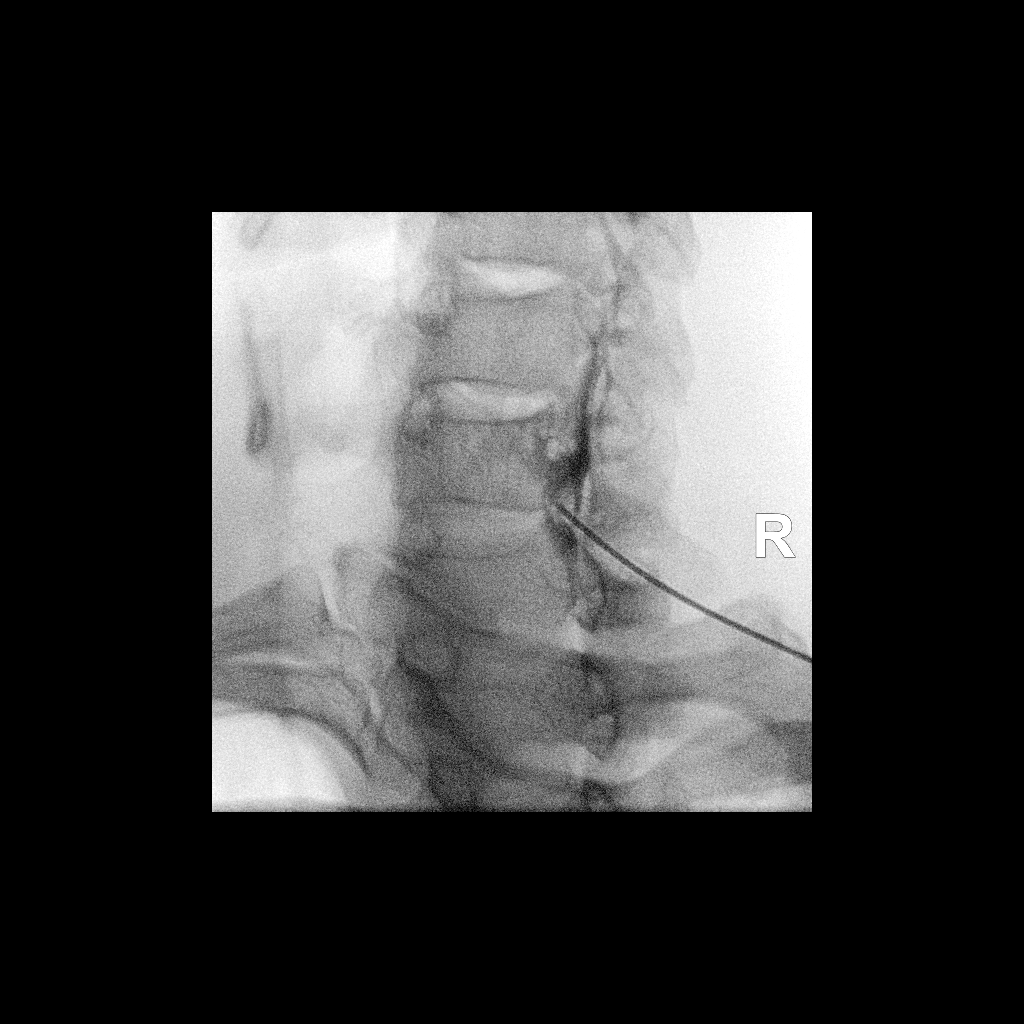

[Series 2: ortho standard · 1 of 1 slices shown (2 of 2)]
[im 1/1]
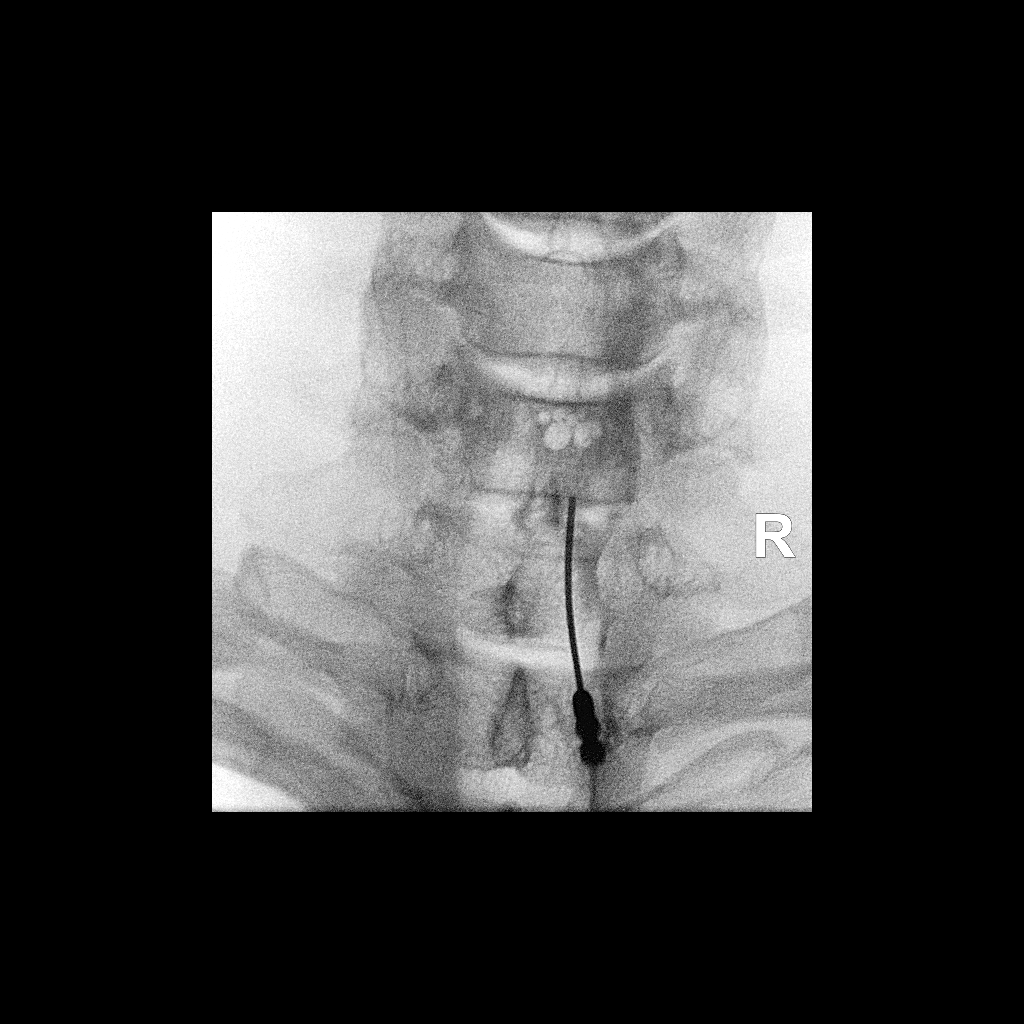

[2 of 2 positions shown; findings below may reference images not displayed]

FLUOROSCOPY TIME:  Radiation Exposure Index (as provided by the
fluoroscopic device): 4.74 uGy*m2

PROCEDURE:
CERVICAL EPIDURAL INJECTION

An interlaminar approach was performed on the right at C6-7. A 20
gauge epidural needle was advanced using loss-of-resistance
technique.

DIAGNOSTIC EPIDURAL INJECTION

Injection of Isovue-M 300 shows a good epidural pattern with spread
above and below the level of needle placement, primarily on the
right. No vascular opacification is seen. THERAPEUTIC

EPIDURAL INJECTION

1.5 ml of Kenalog 40 mixed with 1 ml of 1% Lidocaine and 2 ml of
normal saline were then instilled. The procedure was well-tolerated,
and the patient was discharged thirty minutes following the
injection in good condition.
IMPRESSION: Technically successful first epidural injection on the right at
C6-7.

## 2020-05-23 MED ORDER — TRIAMCINOLONE ACETONIDE 40 MG/ML IJ SUSP (RADIOLOGY)
60.0000 mg | Freq: Once | INTRAMUSCULAR | Status: AC
  Administered 2020-05-23: 60 mg via EPIDURAL

## 2020-05-23 MED ORDER — IOPAMIDOL (ISOVUE-M 300) INJECTION 61%
1.0000 mL | Freq: Once | INTRAMUSCULAR | Status: AC | PRN
  Administered 2020-05-23: 1 mL via EPIDURAL

## 2020-05-23 NOTE — Discharge Instructions (Signed)

## 2020-06-26 DIAGNOSIS — M5030 Other cervical disc degeneration, unspecified cervical region: Secondary | ICD-10-CM

## 2020-07-02 ENCOUNTER — Other Ambulatory Visit: Payer: Self-pay | Admitting: Family Medicine

## 2020-07-07 ENCOUNTER — Ambulatory Visit
Admission: RE | Admit: 2020-07-07 | Discharge: 2020-07-07 | Disposition: A | Discharge status: Home / Self Care | Payer: BC Managed Care – PPO | Source: Ambulatory Visit | Attending: Sports Medicine | Admitting: Sports Medicine

## 2020-07-07 ENCOUNTER — Other Ambulatory Visit: Payer: BC Managed Care – PPO

## 2020-07-07 ENCOUNTER — Other Ambulatory Visit: Payer: Self-pay

## 2020-07-07 DIAGNOSIS — M47812 Spondylosis without myelopathy or radiculopathy, cervical region: Secondary | ICD-10-CM | POA: Diagnosis not present

## 2020-07-07 DIAGNOSIS — M5030 Other cervical disc degeneration, unspecified cervical region: Secondary | ICD-10-CM

## 2020-07-07 IMAGING — XA DG INJECT/[PERSON_NAME] INC NEEDLE/CATH/PLC EPI/CERV/THOR W/IMG
2 series · 2 of 2 positions shown · non-contrast
Comparison: none

CLINICAL DATA: Cervical spondylosis without myelopathy. Positive
response to both prior epidural injections though with recurrence of
neck and right greater than left arm pain.

[Series 1: ortho adipose · 1 of 1 slices shown (1 of 2)]
[im 1/1]
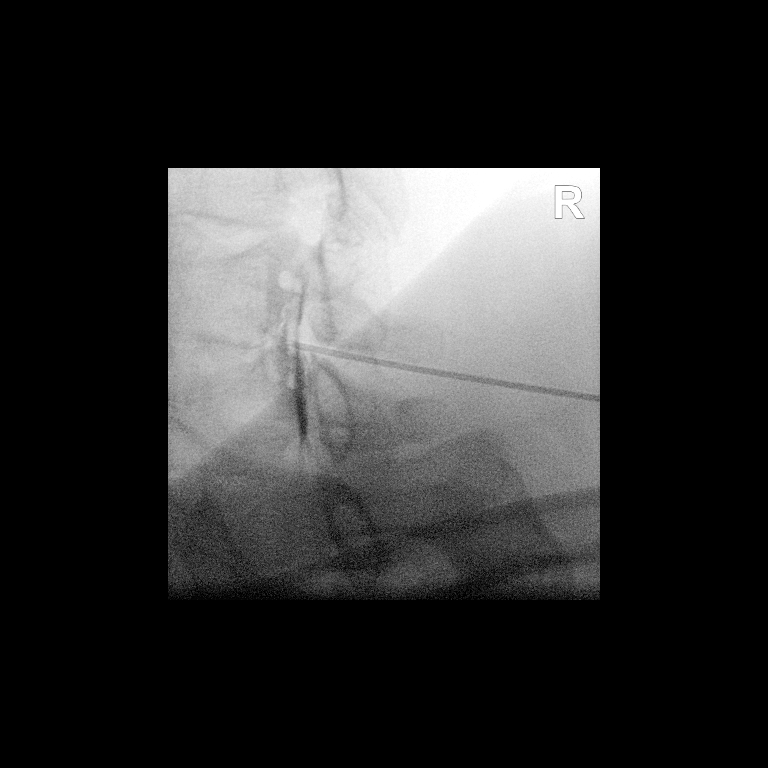

[Series 2: ortho adipose · 1 of 1 slices shown (2 of 2)]
[im 1/1]
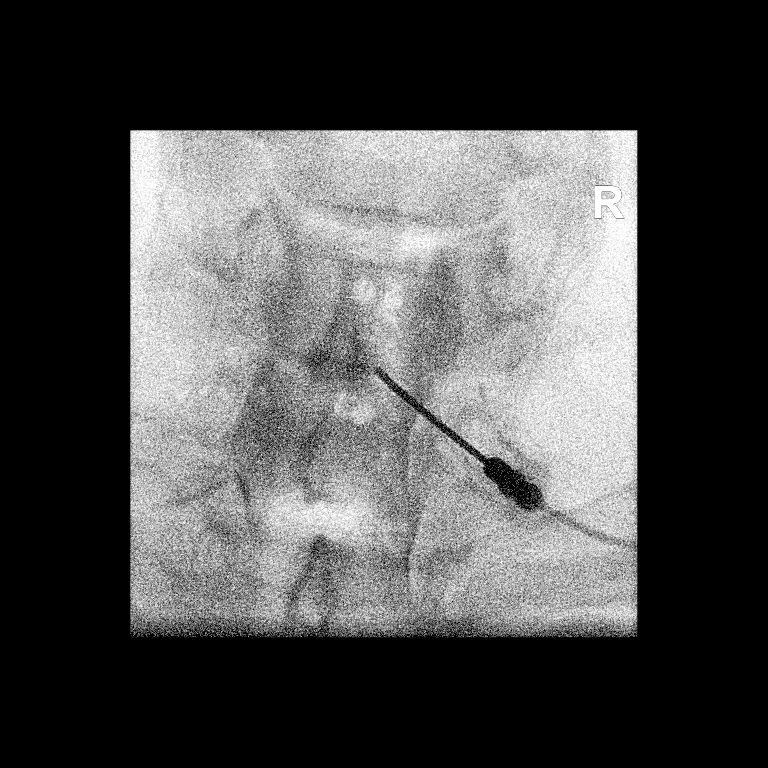

[2 of 2 positions shown; findings below may reference images not displayed]

FLUOROSCOPY TIME:  Fluoroscopy Time: 29 seconds

Radiation Exposure Index: 10.75 microGray*m^2

PROCEDURE:
The procedure, risks, benefits, and alternatives were explained to
the patient. Questions regarding the procedure were encouraged and
answered. The patient understands and consents to the procedure.

CERVICAL EPIDURAL INJECTION

An interlaminar approach was performed on the right at C6-7. A
inch 20 gauge epidural needle was advanced using loss-of-resistance
technique.

DIAGNOSTIC EPIDURAL INJECTION

Injection of Isovue-M 300 shows a good epidural pattern with spread
above and below the level of needle placement, bilaterally but
greater on the right. No vascular opacification is seen.

THERAPEUTIC EPIDURAL INJECTION

1.5 ml of Kenalog 40 mixed with 2 ml of normal saline were then
instilled. The procedure was well-tolerated, and the patient was
discharged thirty minutes following the injection in good condition.
IMPRESSION: Technically successful interlaminar epidural injection on the right
at C6-7.

## 2020-07-07 MED ORDER — IOPAMIDOL (ISOVUE-M 300) INJECTION 61%
1.0000 mL | Freq: Once | INTRAMUSCULAR | Status: AC | PRN
  Administered 2020-07-07: 1 mL via EPIDURAL

## 2020-07-07 MED ORDER — TRIAMCINOLONE ACETONIDE 40 MG/ML IJ SUSP (RADIOLOGY)
60.0000 mg | Freq: Once | INTRAMUSCULAR | Status: AC
  Administered 2020-07-07: 60 mg via EPIDURAL

## 2020-07-07 NOTE — Discharge Instructions (Signed)

## 2020-07-14 ENCOUNTER — Other Ambulatory Visit: Payer: Self-pay | Admitting: Family Medicine

## 2020-07-22 ENCOUNTER — Other Ambulatory Visit: Payer: Self-pay | Admitting: Family Medicine

## 2020-07-22 ENCOUNTER — Encounter: Payer: Self-pay | Admitting: Family Medicine

## 2020-07-25 ENCOUNTER — Other Ambulatory Visit: Payer: Self-pay | Admitting: Family Medicine

## 2020-07-25 MED ORDER — UBRELVY 50 MG PO TABS: ORAL_TABLET | ORAL | 3 refills | Status: DC

## 2020-07-26 ENCOUNTER — Telehealth: Payer: Self-pay

## 2020-07-26 NOTE — Telephone Encounter (Signed)
PA submitted and approved for Ubrelvy. Pharmacy notified.  Effective from 07/26/2020 through 10/17/2020.

## 2020-07-27 ENCOUNTER — Other Ambulatory Visit: Payer: Self-pay

## 2020-07-27 ENCOUNTER — Ambulatory Visit: Payer: BC Managed Care – PPO | Admitting: Sports Medicine

## 2020-07-27 DIAGNOSIS — M5030 Other cervical disc degeneration, unspecified cervical region: Secondary | ICD-10-CM

## 2020-07-27 NOTE — Progress Notes (Signed)
    Procedures performed today:    None.  Independent interpretation of notes and tests performed by another provider:   None.  Brief History, Exam, Impression, and Recommendations:    Degenerative disc disease, cervical Justin Ashley is a pleasant 45 year old male, he has chronic neck pain, with right-sided radicular symptoms in the C5-C6 distribution, after failure of physical therapy, steroids, NSAIDs we got an MRI that showed C4-C6 DDD worst at the C4-C5 level with central stenosis, gabapentin created excessive cognitive defect so we switched to Lyrica which seemed to help to some degree, he is however getting some sedation. He is not interested in narcotic, he is not interested in tramadol, he is also not interested in alternative modalities such as chiropractors or acupuncture. At this juncture we are going to get him referred to spine surgery for discussion of ACDF He can continue his Lyrica in the meantime.    ___________________________________________ Justin Ashley, M.D., ABFM., CAQSM. Primary Care and [place] Instructor of [place] of [facility]

## 2020-07-27 NOTE — Assessment & Plan Note (Signed)
Justin Ashley is a pleasant 45 year old male, he has chronic neck pain, with right-sided radicular symptoms in the C5-C6 distribution, after failure of physical therapy, steroids, NSAIDs we got an MRI that showed C4-C6 DDD worst at the C4-C5 level with central stenosis, gabapentin created excessive cognitive defect so we switched to Lyrica which seemed to help to some degree, he is however getting some sedation. He is not interested in narcotic, he is not interested in tramadol, he is also not interested in alternative modalities such as chiropractors or acupuncture. At this juncture we are going to get him referred to spine surgery for discussion of ACDF He can continue his Lyrica in the meantime.

## 2020-07-28 ENCOUNTER — Encounter (INDEPENDENT_AMBULATORY_CARE_PROVIDER_SITE_OTHER): Payer: BC Managed Care – PPO

## 2020-07-28 DIAGNOSIS — M5030 Other cervical disc degeneration, unspecified cervical region: Secondary | ICD-10-CM | POA: Diagnosis not present

## 2020-08-15 DIAGNOSIS — M5412 Radiculopathy, cervical region: Secondary | ICD-10-CM | POA: Diagnosis not present

## 2020-08-17 ENCOUNTER — Other Ambulatory Visit: Payer: Self-pay | Admitting: Orthopedic Surgery

## 2020-08-19 MED ORDER — PREGABALIN 75 MG PO CAPS: ORAL_CAPSULE | ORAL | 3 refills | Status: DC

## 2020-08-19 NOTE — Telephone Encounter (Signed)
I spent 5 total minutes of online digital evaluation and management services. 

## 2020-09-02 MED ORDER — TRAMADOL HCL 50 MG PO TABS: 50.0000 mg | ORAL_TABLET | Freq: Two times a day (BID) | ORAL | 0 refills | Status: DC | PRN

## 2020-09-02 NOTE — Addendum Note (Signed)
Addended by: Silverio Decamp on: 09/02/2020 09:09 AM   Modules accepted: Orders

## 2020-09-05 NOTE — Progress Notes (Signed)
Surgical Instructions    Your procedure is scheduled on 09/08/20.  Report to [facility] Main Entrance "A" at 11:15 A.M., then check in with the Admitting office.  Call this number if you have problems the morning of surgery:  [phone]   If you have any questions prior to your surgery date call [phone]: Open Monday-Friday 8am-4pm    Remember:  Do not eat after midnight the night before your surgery  You may drink clear liquids until 10:15am the morning of your surgery.   Clear liquids allowed are: Water, Non-Citrus Juices (without pulp), Carbonated Beverages, Clear Tea, Black Coffee Only, and Gatorade  Patient Instructions  The night before surgery:  No food after midnight. ONLY clear liquids after midnight  The day of surgery (if you do NOT have diabetes):  Drink ONE (1) Pre-Surgery Clear Ensure by 10:15am the morning of surgery. Drink in one sitting. Do not sip.  This drink was given to you during your hospital  pre-op appointment visit. Nothing else to drink after completing the  Pre-Surgery Clear Ensure.         If you have questions, please contact your surgeon's office.     Take these medicines the morning of surgery with A SIP OF WATER  acetaminophen (TYLENOL) if needed albuterol (VENTOLIN HFA) inhaler if needed clonazePAM (KLONOPIN) if needed fluticasone (FLONASE) if needed methocarbamol (ROBAXIN) if needed pantoprazole (PROTONIX) if needed pregabalin (LYRICA) if needed traMADol (ULTRAM) Ubrogepant (UBRELVY) if needed    As of today, STOP taking any Aspirin (unless otherwise instructed by your surgeon) Aleve, Naproxen, Ibuprofen, Motrin, Advil, Goody's, BC's, all herbal medications, fish oil, and all vitamins.          Do not wear jewelry or makeup Do not wear lotions, powders, colognes, or deodorant. Men may shave face and neck. Do not bring valuables to the hospital. DO Not wear nail polish, gel polish, artificial nails, or any other type of  covering on natural nails  including finger and toenails.  If patients have artificial nails, gel coating, etc. that need to be removed by a nail salon please have this removed prior to surgery or surgery may need to be canceled/delayed if the surgeon/ anesthesia feels like the patient is unable to be adequately monitored.             [facility] is not responsible for any belongings or valuables.  Do NOT Smoke (Tobacco/Vaping) or drink Alcohol 24 hours prior to your procedure If you use a CPAP at night, you may bring all equipment for your overnight stay.   Contacts, glasses, dentures or bridgework may not be worn into surgery, please bring cases for these belongings   For patients admitted to the hospital, discharge time will be determined by your treatment team.   Patients discharged the day of surgery will not be allowed to drive home, and someone needs to stay with them for 24 hours.  ONLY 1 SUPPORT PERSON MAY BE PRESENT WHILE YOU ARE IN SURGERY. IF YOU ARE TO BE ADMITTED ONCE YOU ARE IN YOUR ROOM YOU WILL BE ALLOWED TWO (2) VISITORS.  Minor children may have two parents present. Special consideration for safety and communication needs will be reviewed on a case by case basis.  Special instructions:    Oral Hygiene is also important to reduce your risk of infection.  Remember - BRUSH YOUR TEETH THE MORNING OF SURGERY WITH YOUR REGULAR TOOTHPASTE   - Preparing For Surgery  Before surgery, you  can play an important role. Because skin is not sterile, your skin needs to be as free of germs as possible. You can reduce the number of germs on your skin by washing with CHG (chlorahexidine gluconate) Soap before surgery.  CHG is an antiseptic cleaner which kills germs and bonds with the skin to continue killing germs even after washing.     Please do not use if you have an allergy to CHG or antibacterial soaps. If your skin becomes reddened/irritated stop using the CHG.  Do not  shave (including legs and underarms) for at least 48 hours prior to first CHG shower. It is OK to shave your face.  Please follow these instructions carefully.     Shower the NIGHT BEFORE SURGERY and the MORNING OF SURGERY with CHG Soap.   If you chose to wash your hair, wash your hair first as usual with your normal shampoo. After you shampoo, rinse your hair and body thoroughly to remove the shampoo.  Then [employer] and genitals (private parts) with your normal soap and rinse thoroughly to remove soap.  After that Use CHG Soap as you would any other liquid soap. You can apply CHG directly to the skin and wash gently with a scrungie or a clean washcloth.   Apply the CHG Soap to your body ONLY FROM THE NECK DOWN.  Do not use on open wounds or open sores. Avoid contact with your eyes, ears, mouth and genitals (private parts). Wash Face and genitals (private parts)  with your normal soap.   Wash thoroughly, paying special attention to the area where your surgery will be performed.  Thoroughly rinse your body with warm water from the neck down.  DO NOT shower/wash with your normal soap after using and rinsing off the CHG Soap.  Pat yourself dry with a CLEAN TOWEL.  Wear CLEAN PAJAMAS to bed the night before surgery  Place CLEAN SHEETS on your bed the night before your surgery  DO NOT SLEEP WITH PETS.   Day of Surgery: Take a shower with CHG soap. Wear Clean/Comfortable clothing the morning of surgery Do not apply any deodorants/lotions.   Remember to brush your teeth WITH YOUR REGULAR TOOTHPASTE.   Please read over the following fact sheets that you were given.

## 2020-09-06 ENCOUNTER — Encounter (HOSPITAL_COMMUNITY)
Admission: RE | Admit: 2020-09-06 | Discharge: 2020-09-06 | Disposition: A | Discharge status: Home / Self Care | Payer: BC Managed Care – PPO | Source: Ambulatory Visit | Attending: Orthopedic Surgery | Admitting: Orthopedic Surgery

## 2020-09-06 ENCOUNTER — Encounter (HOSPITAL_COMMUNITY): Payer: Self-pay

## 2020-09-06 ENCOUNTER — Other Ambulatory Visit: Payer: Self-pay

## 2020-09-06 DIAGNOSIS — M5412 Radiculopathy, cervical region: Secondary | ICD-10-CM | POA: Diagnosis not present

## 2020-09-06 DIAGNOSIS — Z20822 Contact with and (suspected) exposure to covid-19: Secondary | ICD-10-CM | POA: Diagnosis not present

## 2020-09-06 DIAGNOSIS — Z888 Allergy status to other drugs, medicaments and biological substances status: Secondary | ICD-10-CM | POA: Diagnosis not present

## 2020-09-06 DIAGNOSIS — Z01818 Encounter for other preprocedural examination: Secondary | ICD-10-CM | POA: Insufficient documentation

## 2020-09-06 DIAGNOSIS — Z79899 Other long term (current) drug therapy: Secondary | ICD-10-CM | POA: Diagnosis not present

## 2020-09-06 DIAGNOSIS — M4802 Spinal stenosis, cervical region: Secondary | ICD-10-CM | POA: Diagnosis not present

## 2020-09-06 DIAGNOSIS — Z885 Allergy status to narcotic agent status: Secondary | ICD-10-CM | POA: Diagnosis not present

## 2020-09-06 DIAGNOSIS — F17290 Nicotine dependence, other tobacco product, uncomplicated: Secondary | ICD-10-CM | POA: Diagnosis not present

## 2020-09-06 HISTORY — DX: Personal history of urinary calculi: Z87.442

## 2020-09-06 HISTORY — DX: Prediabetes: R73.03

## 2020-09-06 HISTORY — DX: Headache, unspecified: R51.9

## 2020-09-06 HISTORY — DX: Gastro-esophageal reflux disease without esophagitis: K21.9

## 2020-09-06 LAB — COMPREHENSIVE METABOLIC PANEL
ALT: 27 U/L (ref 0–44)
AST: 23 U/L (ref 15–41)
Albumin: 4.5 g/dL (ref 3.5–5.0)
Alkaline Phosphatase: 44 U/L (ref 38–126)
Anion gap: 12 (ref 5–15)
BUN: 7 mg/dL (ref 6–20)
CO2: 24 mmol/L (ref 22–32)
Calcium: 9.3 mg/dL (ref 8.9–10.3)
Chloride: 103 mmol/L (ref 98–111)
Creatinine, Ser: 1 mg/dL (ref 0.61–1.24)
GFR, Estimated: >60 mL/min (ref >60)
Glucose, Bld: 90 mg/dL (ref 70–99)
Potassium: 3.4 mmol/L — ABNORMAL LOW (ref 3.5–5.1)
Sodium: 139 mmol/L (ref 135–145)
Total Bilirubin: 1.1 mg/dL (ref 0.3–1.2)
Total Protein: 7.4 g/dL (ref 6.5–8.1)

## 2020-09-06 LAB — URINALYSIS, ROUTINE W REFLEX MICROSCOPIC
Bilirubin Urine: NEGATIVE
Glucose, UA: NEGATIVE mg/dL
Hgb urine dipstick: NEGATIVE
Ketones, ur: NEGATIVE mg/dL
Leukocytes,Ua: NEGATIVE
Nitrite: NEGATIVE
Protein, ur: NEGATIVE mg/dL
Specific Gravity, Urine: 1.01 (ref 1.005–1.030)
pH: 6 (ref 5.0–8.0)

## 2020-09-06 LAB — APTT: aPTT: 34 seconds (ref 24–36)

## 2020-09-06 LAB — CBC WITH DIFFERENTIAL/PLATELET
Abs Immature Granulocytes: 0.05 10*3/uL (ref 0.00–0.07)
Basophils Absolute: 0 10*3/uL (ref 0.0–0.1)
Basophils Relative: 1 %
Eosinophils Absolute: 0.1 10*3/uL (ref 0.0–0.5)
Eosinophils Relative: 1 %
HCT: 48.2 % (ref 39.0–52.0)
Hemoglobin: 15.5 g/dL (ref 13.0–17.0)
Immature Granulocytes: 1 %
Lymphocytes Relative: 26 %
Lymphs Abs: 1.6 10*3/uL (ref 0.7–4.0)
MCH: 29.5 pg (ref 26.0–34.0)
MCHC: 32.2 g/dL (ref 30.0–36.0)
MCV: 91.6 fL (ref 80.0–100.0)
Monocytes Absolute: 0.4 10*3/uL (ref 0.1–1.0)
Monocytes Relative: 6 %
Neutro Abs: 4.1 10*3/uL (ref 1.7–7.7)
Neutrophils Relative %: 65 %
Platelets: 442 10*3/uL — ABNORMAL HIGH (ref 150–400)
RBC: 5.26 MIL/uL (ref 4.22–5.81)
RDW: 13.2 % (ref 11.5–15.5)
WBC: 6.3 10*3/uL (ref 4.0–10.5)
nRBC: 0 % (ref 0.0–0.2)

## 2020-09-06 LAB — TYPE AND SCREEN
ABO/RH(D): O POS
Antibody Screen: NEGATIVE

## 2020-09-06 LAB — SARS CORONAVIRUS 2 (TAT 6-24 HRS): SARS Coronavirus 2: NEGATIVE

## 2020-09-06 LAB — PROTIME-INR
INR: 1 (ref 0.8–1.2)
Prothrombin Time: 12.9 seconds (ref 11.4–15.2)

## 2020-09-06 LAB — SURGICAL PCR SCREEN
MRSA, PCR: NEGATIVE
Staphylococcus aureus: POSITIVE — AB

## 2020-09-06 LAB — GLUCOSE, CAPILLARY: Glucose-Capillary: 91 mg/dL (ref 70–99)

## 2020-09-06 NOTE — Progress Notes (Addendum)
Patient stated he tested positive for Covid on May 20th using home test.  PCP - Weston Settle Cardiologist - Dr. Curt Bears saw last in 2018 for palpitations doesn't see anymore  Chest x-ray - Not indicated EKG - 09/06/20 Stress Test - 12/08/15 ECHO - 12/07/15  Sleep Study - No OSA  Prediabetic CBG at PAT appt 91 Checks Blood Sugar once every other month  ERAS Protcol -Yes  PRE-SURGERY G2-   COVID TEST- 09/06/20  Anesthesia review: No  Patient denies shortness of breath, fever, cough and chest pain at PAT appointment   All instructions explained to the patient, with a verbal understanding of the material. Patient agrees to go over the instructions while at home for a better understanding. Patient also instructed to wear a mask in public after being tested for COVID-19. The opportunity to ask questions was provided.

## 2020-09-08 ENCOUNTER — Ambulatory Visit (HOSPITAL_COMMUNITY): Payer: BC Managed Care – PPO | Admitting: Certified Registered"

## 2020-09-08 ENCOUNTER — Other Ambulatory Visit: Payer: Self-pay

## 2020-09-08 ENCOUNTER — Ambulatory Visit (HOSPITAL_COMMUNITY)
Admission: RE | Admit: 2020-09-08 | Discharge: 2020-09-08 | Disposition: A | Discharge status: Home / Self Care | Payer: BC Managed Care – PPO | Attending: Orthopedic Surgery | Admitting: Orthopedic Surgery

## 2020-09-08 ENCOUNTER — Ambulatory Visit (HOSPITAL_COMMUNITY)
Admission: RE | Disposition: A | Discharge status: Home / Self Care | Payer: Self-pay | Source: Home / Self Care | Attending: Orthopedic Surgery

## 2020-09-08 ENCOUNTER — Encounter (HOSPITAL_COMMUNITY): Payer: Self-pay | Admitting: Orthopedic Surgery

## 2020-09-08 ENCOUNTER — Ambulatory Visit (HOSPITAL_COMMUNITY): Payer: BC Managed Care – PPO

## 2020-09-08 DIAGNOSIS — F17290 Nicotine dependence, other tobacco product, uncomplicated: Secondary | ICD-10-CM | POA: Insufficient documentation

## 2020-09-08 DIAGNOSIS — Z888 Allergy status to other drugs, medicaments and biological substances status: Secondary | ICD-10-CM | POA: Diagnosis not present

## 2020-09-08 DIAGNOSIS — M50122 Cervical disc disorder at C5-C6 level with radiculopathy: Secondary | ICD-10-CM | POA: Diagnosis not present

## 2020-09-08 DIAGNOSIS — M4802 Spinal stenosis, cervical region: Secondary | ICD-10-CM | POA: Diagnosis not present

## 2020-09-08 DIAGNOSIS — Z885 Allergy status to narcotic agent status: Secondary | ICD-10-CM | POA: Diagnosis not present

## 2020-09-08 DIAGNOSIS — M5412 Radiculopathy, cervical region: Secondary | ICD-10-CM | POA: Diagnosis not present

## 2020-09-08 DIAGNOSIS — Z419 Encounter for procedure for purposes other than remedying health state, unspecified: Secondary | ICD-10-CM

## 2020-09-08 DIAGNOSIS — M5030 Other cervical disc degeneration, unspecified cervical region: Secondary | ICD-10-CM | POA: Diagnosis not present

## 2020-09-08 DIAGNOSIS — M50121 Cervical disc disorder at C4-C5 level with radiculopathy: Secondary | ICD-10-CM | POA: Diagnosis not present

## 2020-09-08 DIAGNOSIS — Z20822 Contact with and (suspected) exposure to covid-19: Secondary | ICD-10-CM | POA: Insufficient documentation

## 2020-09-08 DIAGNOSIS — Z79899 Other long term (current) drug therapy: Secondary | ICD-10-CM | POA: Diagnosis not present

## 2020-09-08 DIAGNOSIS — I10 Essential (primary) hypertension: Secondary | ICD-10-CM | POA: Diagnosis not present

## 2020-09-08 DIAGNOSIS — R7303 Prediabetes: Secondary | ICD-10-CM | POA: Diagnosis not present

## 2020-09-08 DIAGNOSIS — K219 Gastro-esophageal reflux disease without esophagitis: Secondary | ICD-10-CM | POA: Diagnosis not present

## 2020-09-08 DIAGNOSIS — M4322 Fusion of spine, cervical region: Secondary | ICD-10-CM | POA: Diagnosis not present

## 2020-09-08 DIAGNOSIS — Z981 Arthrodesis status: Secondary | ICD-10-CM | POA: Diagnosis not present

## 2020-09-08 HISTORY — PX: ANTERIOR CERVICAL DECOMP/DISCECTOMY FUSION: SHX1161

## 2020-09-08 LAB — GLUCOSE, CAPILLARY
Glucose-Capillary: 100 mg/dL — ABNORMAL HIGH (ref 70–99)
Glucose-Capillary: 102 mg/dL — ABNORMAL HIGH (ref 70–99)

## 2020-09-08 LAB — ABO/RH: ABO/RH(D): O POS

## 2020-09-08 IMAGING — RF DG CERVICAL SPINE 1V
1 series · 3 of 3 positions shown · non-contrast
Comparison: MRI cervical spine [DATE].

CLINICAL DATA: ACDF C4-C5, C5-C6.

EXAM:
DG C-ARM 1-60 MIN; DG CERVICAL SPINE - 1 VIEW
FLUOROSCOPY TIME:  Fluoroscopy Time:  10 seconds.
Radiation Exposure Index (if provided by the fluoroscopic device):
2.10 mGy.
Number of Acquired Spot Images: 3

[Series 1: run · 3 of 3 slices shown]
[im 1/3]
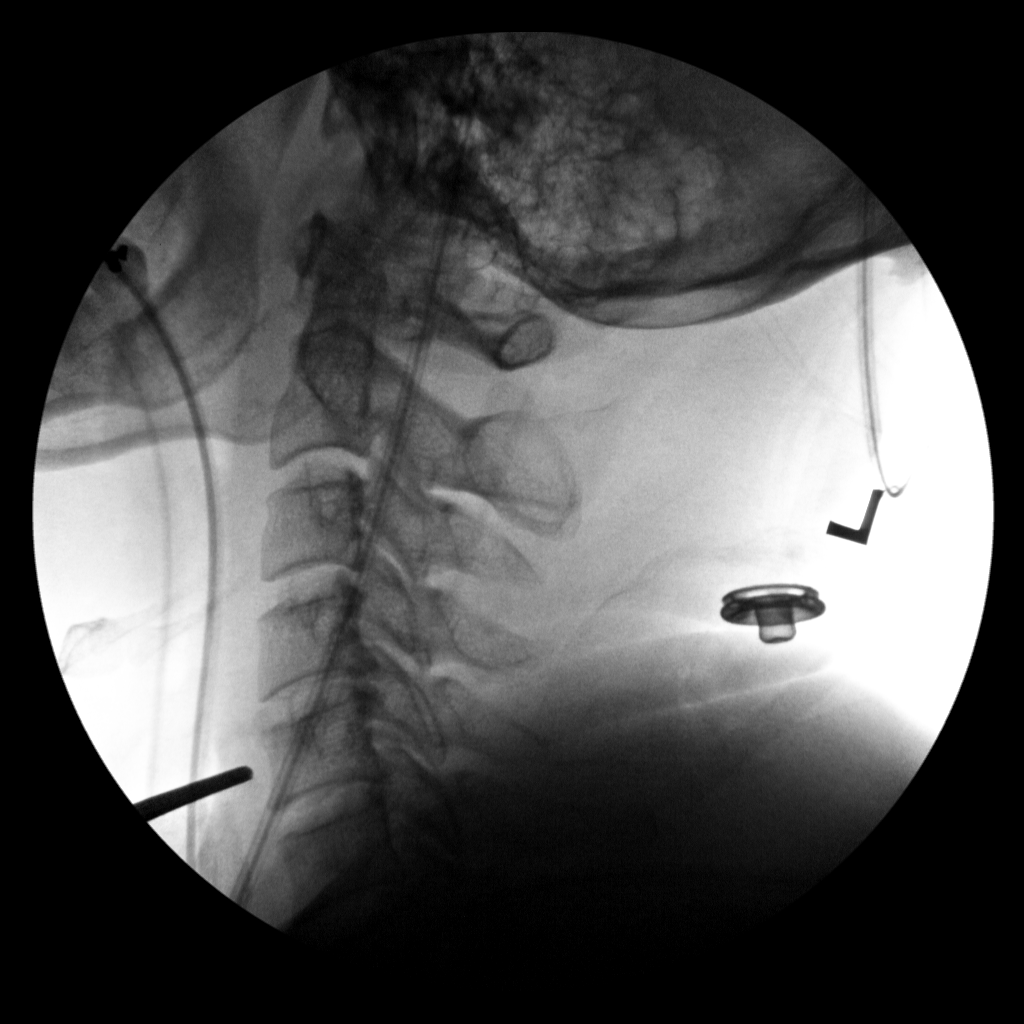
[im 2/3]
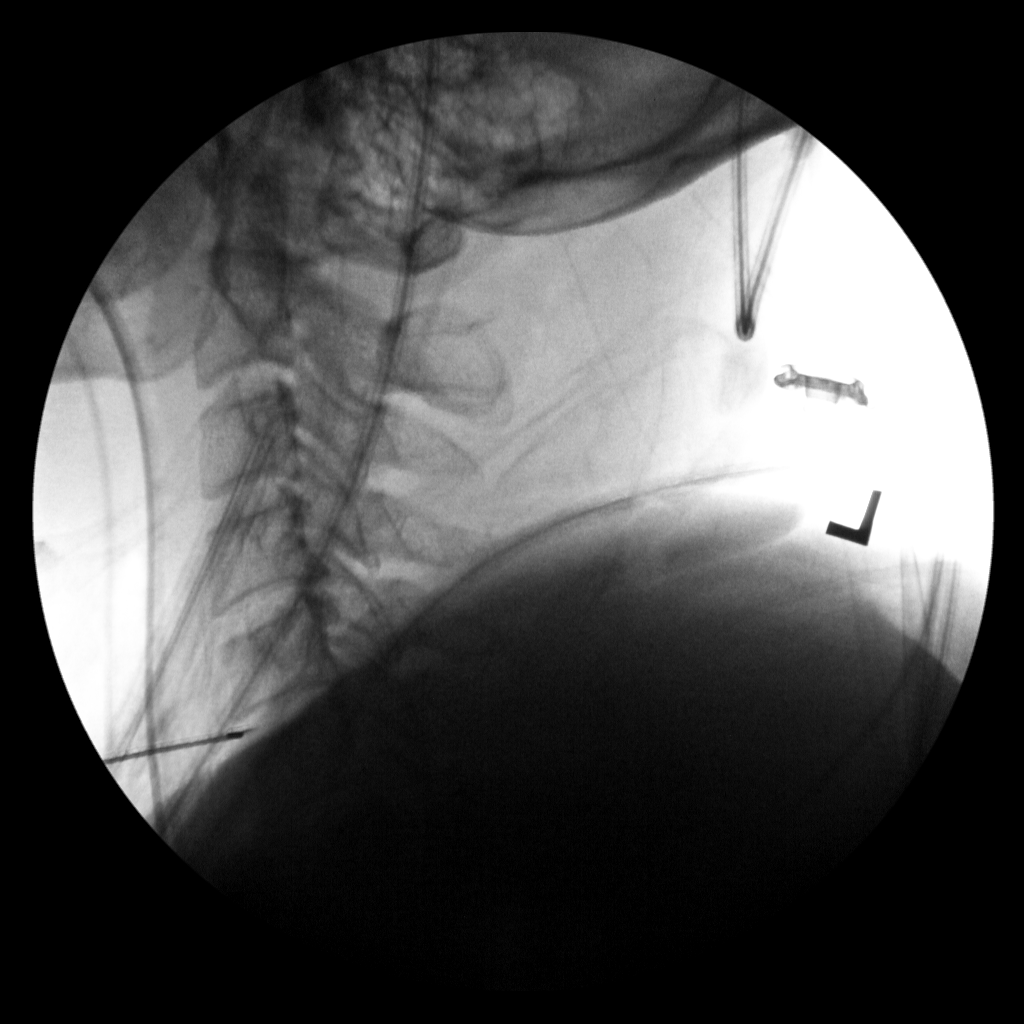
[im 3/3]
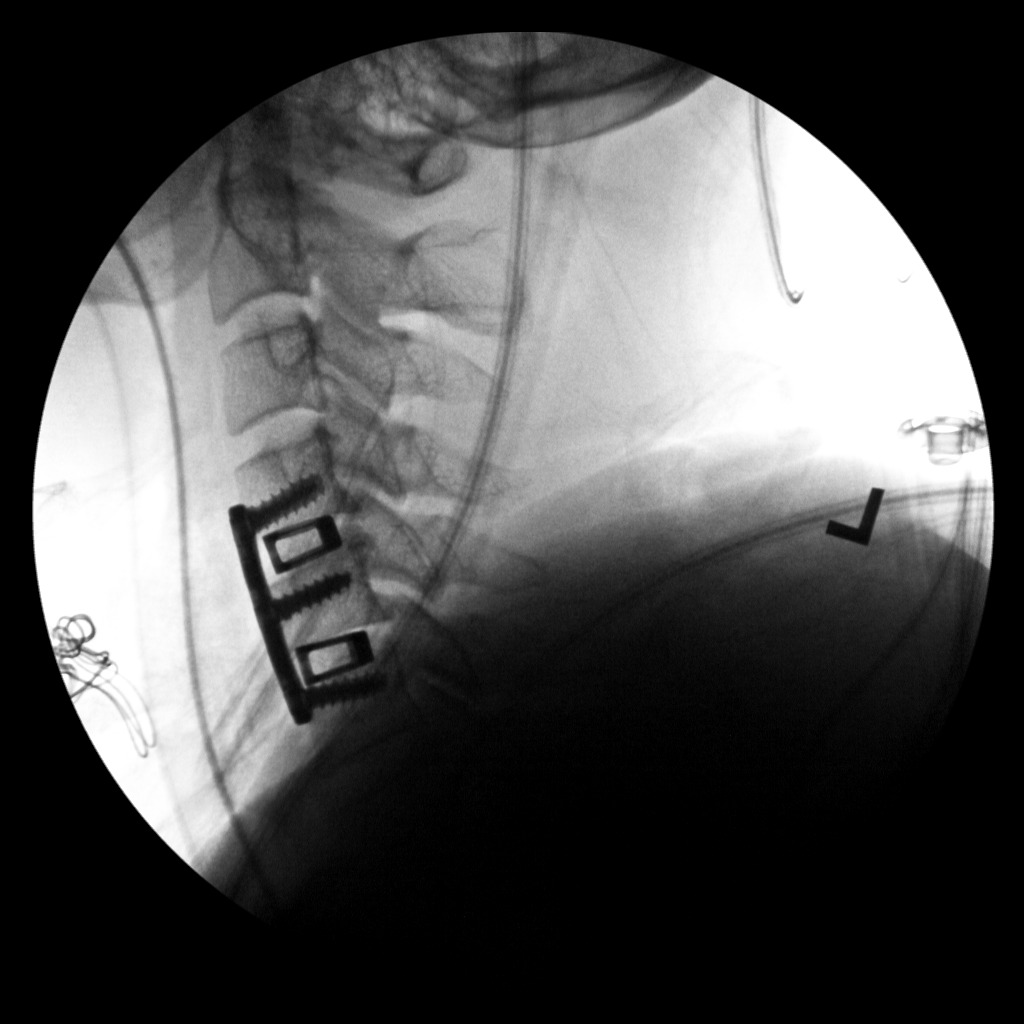

[3 of 3 positions shown; findings below may reference images not displayed]

FINDINGS: Three C-arm fluoroscopic images were obtained intraoperatively and
submitted for post operative interpretation. The first image
demonstrates surgical probe projecting anteriorly at the C5 level.
The second image demonstrates a needle tip projecting anteriorly at
the C5-C6 interspace. Final image demonstrates anterior plate and
screw fixation spanning C4-C6 with intervening C4-C5 and C5-C6
spacers. No unexpected findings. Please see the performing
provider's procedural report for further detail.
IMPRESSION: Intraoperative imaging during C4-C6 ACDF.

## 2020-09-08 IMAGING — RF DG C-ARM 1-60 MIN
1 series · 3 of 3 positions shown · non-contrast
Comparison: MRI cervical spine [DATE].

CLINICAL DATA: ACDF C4-C5, C5-C6.

EXAM:
DG C-ARM 1-60 MIN; DG CERVICAL SPINE - 1 VIEW
FLUOROSCOPY TIME:  Fluoroscopy Time:  10 seconds.
Radiation Exposure Index (if provided by the fluoroscopic device):
2.10 mGy.
Number of Acquired Spot Images: 3

[Series 1: run · 3 of 3 slices shown]
[im 1/3]
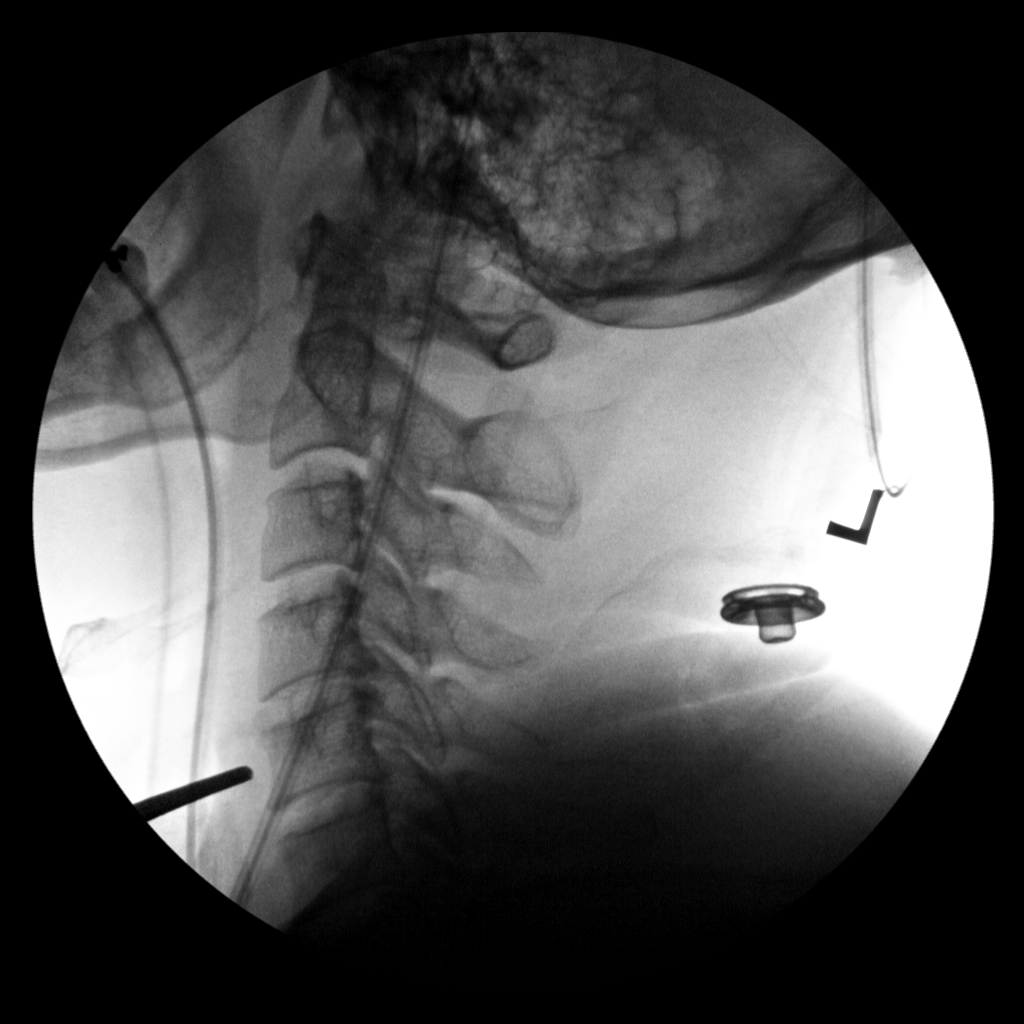
[im 2/3]
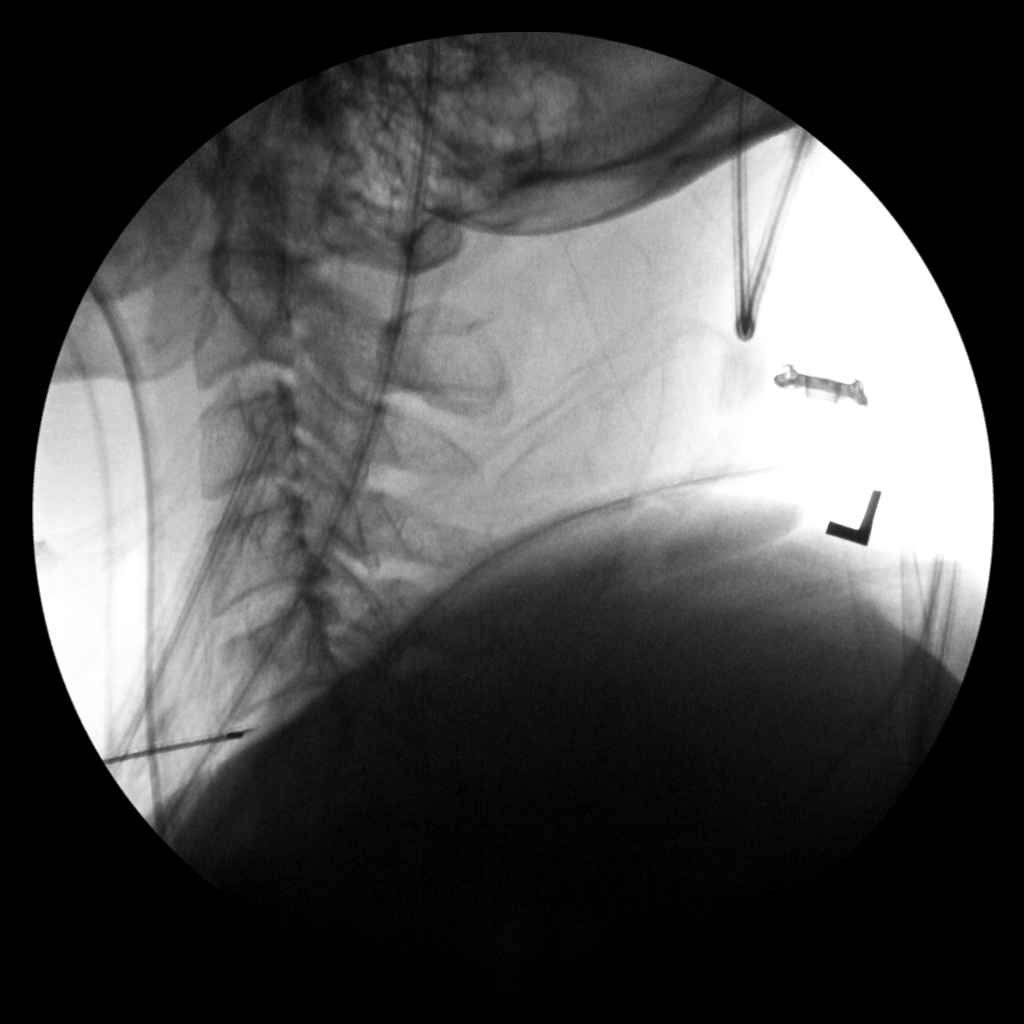
[im 3/3]
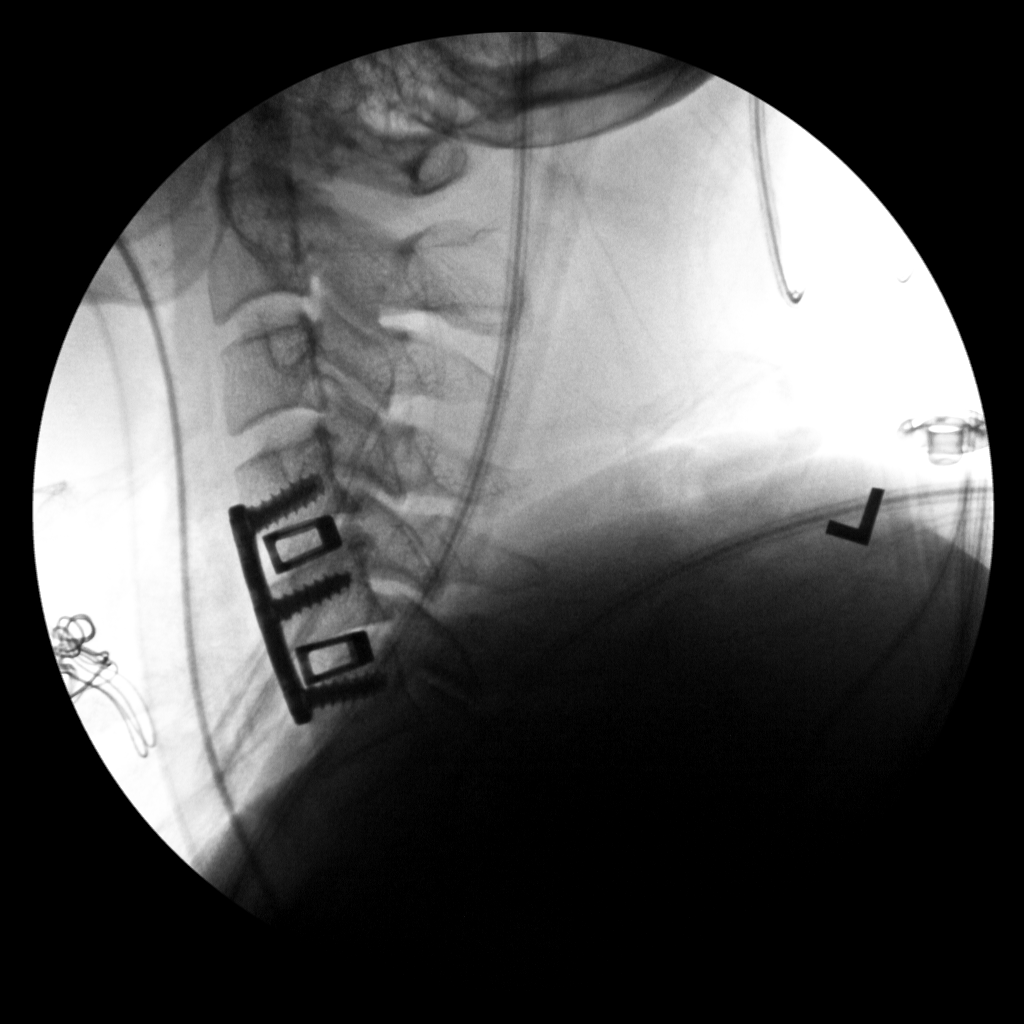

[3 of 3 positions shown; findings below may reference images not displayed]

FINDINGS: Three C-arm fluoroscopic images were obtained intraoperatively and
submitted for post operative interpretation. The first image
demonstrates surgical probe projecting anteriorly at the C5 level.
The second image demonstrates a needle tip projecting anteriorly at
the C5-C6 interspace. Final image demonstrates anterior plate and
screw fixation spanning C4-C6 with intervening C4-C5 and C5-C6
spacers. No unexpected findings. Please see the performing
provider's procedural report for further detail.
IMPRESSION: Intraoperative imaging during C4-C6 ACDF.

## 2020-09-08 SURGERY — ANTERIOR CERVICAL DECOMPRESSION/DISCECTOMY FUSION 2 LEVELS
Anesthesia: General | Site: Spine Cervical

## 2020-09-08 MED ORDER — BUPIVACAINE-EPINEPHRINE 0.25% -1:200000 IJ SOLN
INTRAMUSCULAR | Status: DC | PRN
  Administered 2020-09-08: 4 mL

## 2020-09-08 MED ORDER — FENTANYL CITRATE (PF) 100 MCG/2ML IJ SOLN: INTRAMUSCULAR | Status: DC | PRN

## 2020-09-08 MED ORDER — PHENYLEPHRINE 40 MCG/ML (10ML) SYRINGE FOR IV PUSH (FOR BLOOD PRESSURE SUPPORT)
PREFILLED_SYRINGE | INTRAVENOUS | Status: DC | PRN
  Administered 2020-09-08: 80 ug via INTRAVENOUS

## 2020-09-08 MED ORDER — CEFAZOLIN SODIUM-DEXTROSE 2-4 GM/100ML-% IV SOLN
2.0000 g | INTRAVENOUS | Status: AC
  Administered 2020-09-08: 2 g via INTRAVENOUS

## 2020-09-08 MED ORDER — ONDANSETRON HCL 4 MG/2ML IJ SOLN
INTRAMUSCULAR | Status: AC
  Filled 2020-09-08: qty 2

## 2020-09-08 MED ORDER — KETOROLAC TROMETHAMINE 15 MG/ML IJ SOLN
INTRAMUSCULAR | Status: DC | PRN
  Administered 2020-09-08: 15 mg via INTRAVENOUS

## 2020-09-08 MED ORDER — PHENYLEPHRINE 40 MCG/ML (10ML) SYRINGE FOR IV PUSH (FOR BLOOD PRESSURE SUPPORT)
PREFILLED_SYRINGE | INTRAVENOUS | Status: AC
  Filled 2020-09-08: qty 10

## 2020-09-08 MED ORDER — PROMETHAZINE HCL 25 MG/ML IJ SOLN
INTRAMUSCULAR | Status: AC
  Filled 2020-09-08: qty 1

## 2020-09-08 MED ORDER — CEFAZOLIN SODIUM-DEXTROSE 2-4 GM/100ML-% IV SOLN
INTRAVENOUS | Status: AC
  Filled 2020-09-08: qty 100

## 2020-09-08 MED ORDER — LACTATED RINGERS IV SOLN: INTRAVENOUS | Status: DC

## 2020-09-08 MED ORDER — PROMETHAZINE HCL 25 MG/ML IJ SOLN
6.2500 mg | INTRAMUSCULAR | Status: DC | PRN
  Administered 2020-09-08: 6.25 mg via INTRAVENOUS

## 2020-09-08 MED ORDER — FENTANYL CITRATE (PF) 250 MCG/5ML IJ SOLN
INTRAMUSCULAR | Status: AC
  Filled 2020-09-08: qty 5

## 2020-09-08 MED ORDER — ONDANSETRON HCL 4 MG/2ML IJ SOLN
INTRAMUSCULAR | Status: DC | PRN
  Administered 2020-09-08: 4 mg via INTRAVENOUS

## 2020-09-08 MED ORDER — LIDOCAINE 2% (20 MG/ML) 5 ML SYRINGE
INTRAMUSCULAR | Status: AC
  Filled 2020-09-08: qty 5

## 2020-09-08 MED ORDER — PHENYLEPHRINE HCL-NACL 10-0.9 MG/250ML-% IV SOLN
INTRAVENOUS | Status: DC | PRN
  Administered 2020-09-08: 20 ug/min via INTRAVENOUS

## 2020-09-08 MED ORDER — DEXAMETHASONE SODIUM PHOSPHATE 10 MG/ML IJ SOLN
INTRAMUSCULAR | Status: DC | PRN
  Administered 2020-09-08: 10 mg via INTRAVENOUS

## 2020-09-08 MED ORDER — POVIDONE-IODINE 7.5 % EX SOLN
Freq: Once | CUTANEOUS | Status: DC
  Filled 2020-09-08: qty 118

## 2020-09-08 MED ORDER — THROMBIN 20000 UNITS EX KIT
PACK | CUTANEOUS | Status: AC
  Filled 2020-09-08: qty 1

## 2020-09-08 MED ORDER — PROPOFOL 10 MG/ML IV BOLUS
INTRAVENOUS | Status: DC | PRN
  Administered 2020-09-08: 200 mg via INTRAVENOUS

## 2020-09-08 MED ORDER — FENTANYL CITRATE (PF) 100 MCG/2ML IJ SOLN
25.0000 ug | INTRAMUSCULAR | Status: DC | PRN
  Administered 2020-09-08: 50 ug via INTRAVENOUS

## 2020-09-08 MED ORDER — CHLORHEXIDINE GLUCONATE 0.12 % MT SOLN: 15.0000 mL | Freq: Once | OROMUCOSAL | Status: AC

## 2020-09-08 MED ORDER — FENTANYL CITRATE (PF) 250 MCG/5ML IJ SOLN
INTRAMUSCULAR | Status: DC | PRN
  Administered 2020-09-08 (×5): 50 ug via INTRAVENOUS

## 2020-09-08 MED ORDER — FENTANYL CITRATE (PF) 100 MCG/2ML IJ SOLN
INTRAMUSCULAR | Status: AC
  Filled 2020-09-08: qty 2

## 2020-09-08 MED ORDER — ROCURONIUM BROMIDE 10 MG/ML (PF) SYRINGE
PREFILLED_SYRINGE | INTRAVENOUS | Status: DC | PRN
  Administered 2020-09-08: 60 mg via INTRAVENOUS

## 2020-09-08 MED ORDER — METHOCARBAMOL 500 MG PO TABS: 500.0000 mg | ORAL_TABLET | Freq: Four times a day (QID) | ORAL | 0 refills | Status: DC | PRN

## 2020-09-08 MED ORDER — ORAL CARE MOUTH RINSE: 15.0000 mL | Freq: Once | OROMUCOSAL | Status: AC

## 2020-09-08 MED ORDER — 0.9 % SODIUM CHLORIDE (POUR BTL) OPTIME
TOPICAL | Status: DC | PRN
  Administered 2020-09-08: 1000 mL

## 2020-09-08 MED ORDER — MIDAZOLAM HCL 2 MG/2ML IJ SOLN
INTRAMUSCULAR | Status: AC
  Filled 2020-09-08: qty 2

## 2020-09-08 MED ORDER — MIDAZOLAM HCL 2 MG/2ML IJ SOLN
INTRAMUSCULAR | Status: DC | PRN
  Administered 2020-09-08: 2 mg via INTRAVENOUS

## 2020-09-08 MED ORDER — LIDOCAINE 2% (20 MG/ML) 5 ML SYRINGE
INTRAMUSCULAR | Status: DC | PRN
  Administered 2020-09-08: 60 mg via INTRAVENOUS

## 2020-09-08 MED ORDER — THROMBIN 20000 UNITS EX SOLR
CUTANEOUS | Status: DC | PRN
  Administered 2020-09-08: 20000 [IU] via TOPICAL

## 2020-09-08 MED ORDER — MIDAZOLAM HCL 5 MG/5ML IJ SOLN: INTRAMUSCULAR | Status: DC | PRN

## 2020-09-08 MED ORDER — ROCURONIUM BROMIDE 10 MG/ML (PF) SYRINGE
PREFILLED_SYRINGE | INTRAVENOUS | Status: AC
  Filled 2020-09-08: qty 10

## 2020-09-08 MED ORDER — ACETAMINOPHEN 10 MG/ML IV SOLN
INTRAVENOUS | Status: DC | PRN
  Administered 2020-09-08: 1000 mg via INTRAVENOUS

## 2020-09-08 MED ORDER — BUPIVACAINE-EPINEPHRINE (PF) 0.25% -1:200000 IJ SOLN
INTRAMUSCULAR | Status: AC
  Filled 2020-09-08: qty 30

## 2020-09-08 MED ORDER — DEXAMETHASONE SODIUM PHOSPHATE 10 MG/ML IJ SOLN
INTRAMUSCULAR | Status: AC
  Filled 2020-09-08: qty 1

## 2020-09-08 MED ORDER — HYDROCODONE-ACETAMINOPHEN 5-325 MG PO TABS: 1.0000 | ORAL_TABLET | Freq: Four times a day (QID) | ORAL | 0 refills | Status: DC | PRN

## 2020-09-08 MED ORDER — CHLORHEXIDINE GLUCONATE 0.12 % MT SOLN
OROMUCOSAL | Status: AC
  Administered 2020-09-08: 15 mL via OROMUCOSAL
  Filled 2020-09-08: qty 15

## 2020-09-08 MED ORDER — SUGAMMADEX SODIUM 200 MG/2ML IV SOLN
INTRAVENOUS | Status: DC | PRN
  Administered 2020-09-08: 180 mg via INTRAVENOUS

## 2020-09-08 MED ORDER — ESMOLOL HCL 100 MG/10ML IV SOLN
INTRAVENOUS | Status: DC | PRN
  Administered 2020-09-08: 20 mg via INTRAVENOUS

## 2020-09-08 SURGICAL SUPPLY — 75 items
AGENT HMST KT MTR STRL THRMB (HEMOSTASIS)
APL SKNCLS STERI-STRIP NONHPOA (GAUZE/BANDAGES/DRESSINGS) ×1
BAG COUNTER SPONGE SURGICOUNT (BAG) ×2 IMPLANT
BAG SPNG CNTER NS LX DISP (BAG) ×1
BENZOIN TINCTURE PRP APPL 2/3 (GAUZE/BANDAGES/DRESSINGS) ×2 IMPLANT
BIT DRILL NEURO 2X3.1 SFT TUCH (MISCELLANEOUS) ×1 IMPLANT
BIT DRILL SRG 14X2.2XFLT CHK (BIT) ×1 IMPLANT
BIT DRL SRG 14X2.2XFLT CHK (BIT) ×1
BLADE CLIPPER SURG (BLADE) ×2 IMPLANT
BLADE SURG 15 STRL LF DISP TIS (BLADE) ×1 IMPLANT
BLADE SURG 15 STRL SS (BLADE) ×2
BONE VIVIGEN FORMABLE 1.3CC (Bone Implant) ×4 IMPLANT
CARTRIDGE OIL MAESTRO DRILL (MISCELLANEOUS) ×1 IMPLANT
CLOSURE STERI-STRIP 1/4X4 (GAUZE/BANDAGES/DRESSINGS) ×2 IMPLANT
COLLAR CERV LO CONTOUR FIRM DE (SOFTGOODS) IMPLANT
CORD BIPOLAR FORCEPS 12FT (ELECTRODE) ×2 IMPLANT
COVER SURGICAL LIGHT HANDLE (MISCELLANEOUS) ×2 IMPLANT
DECANTER SPIKE VIAL GLASS SM (MISCELLANEOUS) ×2 IMPLANT
DIFFUSER DRILL AIR PNEUMATIC (MISCELLANEOUS) ×2 IMPLANT
DRAIN JACKSON RD 7FR 3/32 (WOUND CARE) IMPLANT
DRAPE C-ARM 42X72 X-RAY (DRAPES) ×2 IMPLANT
DRAPE POUCH INSTRU U-SHP 10X18 (DRAPES) ×2 IMPLANT
DRAPE SURG 17X23 STRL (DRAPES) ×8 IMPLANT
DRILL BIT SKYLINE 14MM (BIT) ×2
DRILL NEURO 2X3.1 SOFT TOUCH (MISCELLANEOUS) ×2
DURAPREP 26ML APPLICATOR (WOUND CARE) ×2 IMPLANT
ELECT COATED BLADE 2.86 ST (ELECTRODE) ×2 IMPLANT
ELECT REM PT RETURN 9FT ADLT (ELECTROSURGICAL) ×2
ELECTRODE REM PT RTRN 9FT ADLT (ELECTROSURGICAL) ×1 IMPLANT
EVACUATOR SILICONE 100CC (DRAIN) IMPLANT
GAUZE 4X4 16PLY ~~LOC~~+RFID DBL (SPONGE) ×2 IMPLANT
GAUZE SPONGE 4X4 12PLY STRL (GAUZE/BANDAGES/DRESSINGS) ×2 IMPLANT
GLOVE SRG 8 PF TXTR STRL LF DI (GLOVE) ×1 IMPLANT
GLOVE SURG ENC MOIS LTX SZ7 (GLOVE) ×2 IMPLANT
GLOVE SURG ENC MOIS LTX SZ8 (GLOVE) ×2 IMPLANT
GLOVE SURG UNDER POLY LF SZ7 (GLOVE) ×4 IMPLANT
GLOVE SURG UNDER POLY LF SZ8 (GLOVE) ×2
GOWN STRL REUS W/ TWL LRG LVL3 (GOWN DISPOSABLE) ×1 IMPLANT
GOWN STRL REUS W/ TWL XL LVL3 (GOWN DISPOSABLE) ×1 IMPLANT
GOWN STRL REUS W/TWL LRG LVL3 (GOWN DISPOSABLE) ×2
GOWN STRL REUS W/TWL XL LVL3 (GOWN DISPOSABLE) ×2
INTERLOCK LRDTC CRVCL VBR 7MM (Bone Implant) ×2 IMPLANT
IV CATH 14GX2 1/4 (CATHETERS) ×2 IMPLANT
KIT BASIN OR (CUSTOM PROCEDURE TRAY) ×2 IMPLANT
KIT TURNOVER KIT B (KITS) ×2 IMPLANT
LORDOTIC CERVICAL VBR 7MM SM (Bone Implant) ×4 IMPLANT
MANIFOLD NEPTUNE II (INSTRUMENTS) ×2 IMPLANT
NEEDLE PRECISIONGLIDE 27X1.5 (NEEDLE) ×2 IMPLANT
NEEDLE SPNL 20GX3.5 QUINCKE YW (NEEDLE) ×2 IMPLANT
NS IRRIG 1000ML POUR BTL (IV SOLUTION) ×2 IMPLANT
OIL CARTRIDGE MAESTRO DRILL (MISCELLANEOUS) ×2
PACK ORTHO CERVICAL (CUSTOM PROCEDURE TRAY) ×2 IMPLANT
PAD ARMBOARD 7.5X6 YLW CONV (MISCELLANEOUS) ×4 IMPLANT
PATTIES SURGICAL .5 X.5 (GAUZE/BANDAGES/DRESSINGS) IMPLANT
PATTIES SURGICAL .5 X1 (DISPOSABLE) ×2 IMPLANT
PIN DISTRACTION 14 (PIN) ×4 IMPLANT
PLATE TWO LEVEL SKYLINE 30MM (Plate) ×2 IMPLANT
POSITIONER HEAD DONUT 9IN (MISCELLANEOUS) ×2 IMPLANT
SCREW SKYLINE VAR OS 14MM (Screw) ×12 IMPLANT
SPONGE INTESTINAL PEANUT (DISPOSABLE) ×2 IMPLANT
SPONGE SURGIFOAM ABS GEL 100 (HEMOSTASIS) ×2 IMPLANT
STRIP CLOSURE SKIN 1/2X4 (GAUZE/BANDAGES/DRESSINGS) ×2 IMPLANT
SURGIFLO W/THROMBIN 8M KIT (HEMOSTASIS) IMPLANT
SUT MNCRL AB 4-0 PS2 18 (SUTURE) ×2 IMPLANT
SUT SILK 4 0 (SUTURE)
SUT SILK 4-0 18XBRD TIE 12 (SUTURE) IMPLANT
SUT VIC AB 2-0 CT2 18 VCP726D (SUTURE) ×2 IMPLANT
SYR BULB IRRIG 60ML STRL (SYRINGE) ×2 IMPLANT
SYR CONTROL 10ML LL (SYRINGE) ×6 IMPLANT
TAPE CLOTH 4X10 WHT NS (GAUZE/BANDAGES/DRESSINGS) ×2 IMPLANT
TAPE UMBILICAL COTTON 1/8X30 (MISCELLANEOUS) ×2 IMPLANT
TOWEL GREEN STERILE (TOWEL DISPOSABLE) ×2 IMPLANT
TOWEL GREEN STERILE FF (TOWEL DISPOSABLE) ×2 IMPLANT
WATER STERILE IRR 1000ML POUR (IV SOLUTION) ×2 IMPLANT
YANKAUER SUCT BULB TIP NO VENT (SUCTIONS) ×2 IMPLANT

## 2020-09-08 NOTE — Op Note (Signed)
PATIENT NAME: Nijee Heatwole   MEDICAL RECORD NO.:   [id number]    DATE OF BIRTH: [birth year]   DATE OF PROCEDURE: 09/08/2020                               OPERATIVE REPORT     PREOPERATIVE DIAGNOSES: 1. Right-sided cervical radiculopathy. 2. Spinal stenosis spanning C4-C6.   POSTOPERATIVE DIAGNOSES: 1. Right-sided cervical radiculopathy. 2. Spinal stenosis spanning C4-C6.   PROCEDURE: 1. Anterior cervical decompression and fusion C4/5, C5/6 2. Placement of anterior instrumentation, C4-C6 3. Insertion of interbody device x 2 (Titan intervertebral spacers). 4. Intraoperative use of fluoroscopy. 5. Use of morselized allograft - ViviGen.   SURGEON:  Phylliss Bob, MD   ASSISTANT:  Pricilla Holm, PA-C.   ANESTHESIA:  General endotracheal anesthesia.   COMPLICATIONS:  None.   DISPOSITION:  Stable.   ESTIMATED BLOOD LOSS:  Minimal.   INDICATIONS FOR SURGERY:  Briefly, Mr. Speranza is a pleasant 45 y.o. year- old patient, who did present to me with severe pain in the neck and right arm.  The patient's MRI did reveal the findings noted above.  Given the patient's ongoing rather debilitating pain and lack of improvement with appropriate treatment measures, we did discuss proceeding with the procedure noted above.  The patient was fully aware of the risks and limitations of surgery as outlined in my preoperative note.   OPERATIVE DETAILS:  On 09/08/2020, the patient was brought to surgery and general endotracheal anesthesia was administered.  The patient was placed supine on the hospital bed. The neck was gently extended.  All bony prominences were meticulously padded.  The neck was prepped and draped in the usual sterile fashion.  At this point, I did make a left-sided transverse incision.  The platysma was incised.  A Smith-Robinson approach was used and the anterior spine was identified. A self-retaining retractor was placed.  I then subperiosteally exposed the vertebral bodies  from C4-C6.  Caspar pins were then placed into the C5 and C6 vertebral bodies and distraction was applied.  A thorough and complete C5-6 intervertebral diskectomy was performed.  The posterior longitudinal ligament was identified and entered using a nerve hook.  I then used #1 followed by #2 Kerrison to perform a thorough and complete intervertebral diskectomy.  The spinal canal was thoroughly decompressed, as was the right neuroforamen.  The endplates were then prepared and the appropriate-sized intervertebral spacer was then packed with ViviGen and tamped into position in the usual fashion.  The lower Caspar pin was then removed and placed into the C4 vertebral body and once again, distraction was applied across the C4-5 intervertebral space.  I then again performed a thorough and complete diskectomy, thoroughly decompressing the spinal canal and right neuroforamen.  After preparing the endplates, the appropriate-sized intervertebral spacer was packed with ViviGen and tamped into position.  The Caspar pins then were removed and bone wax was placed in their place.  The appropriate-sized anterior cervical plate was placed over the anterior spine.  14 mm variable angle screws were placed, 2 in each vertebral body from C4-C6 for a total of 6 vertebral body screws.  The screws were then locked to the plate using the Cam locking mechanism.  I was very pleased with the final fluoroscopic images.  The wound was then irrigated.  The wound was then explored for any undue bleeding and there was no bleeding noted. The wound was then closed in  layers using 2-0 Vicryl, followed by 4-0 Monocryl.  Benzoin and Steri-Strips were applied, followed by sterile dressing.  All instrument counts were correct at the termination of the procedure.   Of note, Pricilla Holm, PA-C, was my assistant throughout surgery, and did aid in retraction, suctioning, placement of the hardware, and closure from start to  finish.       Phylliss Bob, MD

## 2020-09-08 NOTE — Anesthesia Procedure Notes (Signed)
Procedure Name: Intubation Date/Time: 09/08/2020 1:48 PM Performed by: Reeves Dam, CRNA Pre-anesthesia Checklist: Patient identified, Patient being monitored, Timeout performed, Emergency Drugs available and Suction available Patient Re-evaluated:Patient Re-evaluated prior to induction Oxygen Delivery Method: Circle system utilized Preoxygenation: Pre-oxygenation with 100% oxygen Induction Type: IV induction Ventilation: Mask ventilation without difficulty Laryngoscope Size: Glidescope and 4 Grade View: Grade I Tube type: Oral Tube size: 7.5 mm Number of attempts: 1 Airway Equipment and Method: Video-laryngoscopy Placement Confirmation: ETT inserted through vocal cords under direct vision, positive ETCO2 and breath sounds checked- equal and bilateral Secured at: 21 cm Tube secured with: Tape Dental Injury: Teeth and Oropharynx as per pre-operative assessment

## 2020-09-08 NOTE — Anesthesia Preprocedure Evaluation (Signed)
Anesthesia Evaluation  Patient identified by MRN, date of birth, ID band Patient awake    Reviewed: Allergy & Precautions, NPO status , Patient's Chart, lab work & pertinent test results  Airway Mallampati: II  TM Distance: >3 FB Neck ROM: Full    Dental  (+) Dental Advisory Given   Pulmonary sleep apnea , Current Smoker,    breath sounds clear to auscultation       Cardiovascular hypertension, Pt. on medications  Rhythm:Regular Rate:Normal     Neuro/Psych  Headaches,    GI/Hepatic Neg liver ROS, GERD  ,  Endo/Other  negative endocrine ROS  Renal/GU Renal disease     Musculoskeletal  (+) Arthritis ,   Abdominal   Peds  Hematology negative hematology ROS (+)   Anesthesia Other Findings   Reproductive/Obstetrics                             Anesthesia Physical Anesthesia Plan  ASA: 2  Anesthesia Plan: General   Post-op Pain Management:    Induction: Intravenous  PONV Risk Score and Plan: 1 and Dexamethasone, Treatment may vary due to age or medical condition and Ondansetron  Airway Management Planned: Oral ETT  Additional Equipment:   Intra-op Plan:   Post-operative Plan: Extubation in OR  Informed Consent: I have reviewed the patients History and Physical, chart, labs and discussed the procedure including the risks, benefits and alternatives for the proposed anesthesia with the patient or authorized representative who has indicated his/her understanding and acceptance.     Dental advisory given  Plan Discussed with: CRNA  Anesthesia Plan Comments:         Anesthesia Quick Evaluation

## 2020-09-08 NOTE — H&P (Signed)
PREOPERATIVE H&P  Chief Complaint: Right arm pain  HPI: Justin Ashley is a 45 y.o. male who presents with ongoing pain in the right arm  MRI reveals spinal stenosis spanning C4-C6  Patient has failed multiple forms of conservative care and continues to have pain (see office notes for additional details regarding the patient's full course of treatment)  Past Medical History:  Diagnosis Date   Dislocation of metatarsophalangeal joint of right great toe    Erectile dysfunction 12/24/2012   BCBS will not cover Cialis    Essential hypertension 06/30/2014   GERD (gastroesophageal reflux disease)    Headache    History of kidney stones    History of thrombocytosis 03/09/2013   Hypogonadism in male 12/15/2013   Recheck therapy January 2016    Kidney stone    Pneumonia age 14   Pre-diabetes    Sleep apnea    self report   Suicide attempt ([place]) 12/10/2013   Past Surgical History:  Procedure Laterality Date   DENTAL SURGERY Left 2018   Has metal maybe titanium   NO PAST SURGERIES     Social History   Socioeconomic History   Marital status: Significant Other    Spouse name: Not on file   Number of children: Not on file   Years of education: Not on file   Highest education level: Not on file  Occupational History   Not on file  Tobacco Use   Smoking status: Every Day    Pack years: 0.00    Types: E-cigarettes   Smokeless tobacco: Never  Vaping Use   Vaping Use: Every day  Substance and Sexual Activity   Alcohol use: No   Drug use: No   Sexual activity: Yes  Other Topics Concern   Not on file  Social History Narrative   Not on file   Social Determinants of Health   Financial Resource Strain: Not on file  Food Insecurity: Not on file  Transportation Needs: Not on file  Physical Activity: Not on file  Stress: Not on file  Social Connections: Not on file   Family History  Problem Relation Age of Onset   Cancer Mother        lung   Colon polyps Mother     Cancer Father        stomach, deceased @  33yrs   Hypertension Father    Colon cancer Father 76   Stomach cancer Father    Other Paternal Uncle        intestional cancer   Colon cancer Paternal Uncle 81   Stomach cancer Cousin    Colon cancer Cousin 40   Heart attack Cousin    Esophageal cancer Neg Hx    Allergies  Allergen Reactions   Hydrocodone-Acetaminophen Nausea And Vomiting   Lisinopril Cough    Cough   Oxycodone-Acetaminophen Nausea And Vomiting   Zoloft [Sertraline Hcl]     Suicide attempt   Prior to Admission medications   Medication Sig Start Date End Date Taking? Authorizing Provider  acetaminophen (TYLENOL) 500 MG tablet Take 1,500 mg by mouth 2 (two) times daily as needed for moderate pain.   Yes [provider]  albuterol (VENTOLIN HFA) 108 (90 Base) MCG/ACT inhaler INHALE 2 PUFFS INTO THE LUNGS EVERY 6 HOURS AS NEEDED FOR WHEEZING OR SHORTNESS OF BREATH Patient taking differently: Inhale 2 puffs into the lungs every 6 (six) hours as needed for wheezing or shortness of breath. 07/14/20  Yes  Luetta Nutting, DO  clonazePAM (KLONOPIN) 0.5 MG tablet Take 1 tablet (0.5 mg total) by mouth 2 (two) times daily as needed for anxiety. Patient taking differently: Take 0.5 mg by mouth daily as needed for anxiety. 03/17/20  Yes Matthews, Cody, DO  fluticasone (FLONASE) 50 MCG/ACT nasal spray INHALE 1 SPRAY IN EACH NOSTRIL TWICE DAILY( USE LEFT HAND FOR RIGHT NOSTRIL AND RIGHT HAND FOR LEFT NOSTRIL) Patient taking differently: Place 1 spray into both nostrils 2 (two) times daily as needed for allergies. 02/12/18  Yes Gregor Hams, MD  losartan (COZAAR) 50 MG tablet TAKE 1 TABLET(50 MG) BY MOUTH DAILY Patient taking differently: Take 50 mg by mouth at bedtime. 07/04/20  Yes Luetta Nutting, DO  methocarbamol (ROBAXIN) 500 MG tablet Take 500 mg by mouth every 6 (six) hours as needed for muscle spasms. 08/24/20  Yes [provider]  pantoprazole (PROTONIX) 40 MG tablet  Take 1 tablet (40 mg total) by mouth daily. Patient taking differently: Take 40 mg by mouth daily as needed (acid reflux). 10/13/19  Yes Jackquline Denmark, MD  pregabalin (LYRICA) 75 MG capsule 1 capsule p.o. twice daily to 3 times daily Patient taking differently: Take 75 mg by mouth 3 (three) times daily as needed (pain). 08/19/20  Yes Silverio Decamp, MD  tadalafil (CIALIS) 20 MG tablet Take 0.5-1 tablets (10-20 mg total) by mouth every other day as needed for erectile dysfunction. 03/23/20  Yes Silverio Decamp, MD  testosterone cypionate (DEPOTESTOSTERONE CYPIONATE) 200 MG/ML injection Inject 1.5 mLs (300 mg total) into the muscle every 14 (fourteen) days. 03/24/20  Yes Matthews, Cody, DO  Ubrogepant (UBRELVY) 50 MG TABS TAKE 50 MG INITIALLY AT ONSET OF MIGRAINE. MAY REPEAT AFTER 2 HOURS IF NEEDED Patient taking differently: Take 50 mg by mouth daily as needed (migraine). MAY REPEAT AFTER 2 HOURS IF NEEDED 07/25/20  Yes Luetta Nutting, DO  AMBULATORY NON FORMULARY MEDICATION BD 54mL syringe and BD 22G 1 and 1/2" needle, use to inject testosterone every two weeks.  BD 18G 1 and 1/2" needle used to draw up testosterone. Dx: Hypogonadism 06/30/14   Marcial Pacas, DO  AMBULATORY NON FORMULARY MEDICATION ProAir RespiClick.  Inhale two puffs every four hours only as needed for shortness of breath or wheezing. Use savings voucher. Patient not taking: No sig reported 10/25/14   Hommel, Hilliard Clark, DO  lidocaine (LIDODERM) 5 % Place 1 patch onto the skin daily. Remove & Discard patch within 12 hours or as directed by MD Patient not taking: Reported on 08/31/2020 03/31/20   Silverio Decamp, MD  traMADol (ULTRAM) 50 MG tablet Take 1-2 tablets (50-100 mg total) by mouth every 12 (twelve) hours as needed for moderate pain. Maximum 6 tabs per day. 09/02/20   Silverio Decamp, MD     All other systems have been reviewed and were otherwise negative with the exception of those mentioned in the HPI and as  above.  Physical Exam: There were no vitals filed for this visit.  There is no height or weight on file to calculate BMI.  General: Alert, no acute distress Cardiovascular: No pedal edema Respiratory: No cyanosis, no use of accessory musculature Skin: No lesions in the area of chief complaint Neurologic: Sensation intact distally Psychiatric: Patient is competent for consent with normal mood and affect Lymphatic: No axillary or cervical lymphadenopathy   Assessment/Plan: CHRONIC RIGHT-SIDED CERVICAL RADICULOPATHY Plan for Procedure(s): ANTERIOR CERVICAL DECOMPRESSION FUSION CERVICAL 4- CERVICAL5 , CERVICAL 5 - CERVICAL 6 WITH INSTRUMENTATION AND  ALLOGRAFT   Norva Karvonen, MD 09/08/2020 6:41 AM

## 2020-09-08 NOTE — Transfer of Care (Signed)
Immediate Anesthesia Transfer of Care Note  Patient: Justin Ashley  Procedure(s) Performed: ANTERIOR CERVICAL DECOMPRESSION FUSION CERVICAL 4- CERVICAL5 , CERVICAL 5 - CERVICAL 6 WITH INSTRUMENTATION AND ALLOGRAFT (Spine Cervical)  Patient Location: PACU  Anesthesia Type:General  Level of Consciousness: awake, alert  and oriented  Airway & Oxygen Therapy: Patient Spontanous Breathing and Patient connected to face mask oxygen  Post-op Assessment: Report given to RN and Post -op Vital signs reviewed and stable  Post vital signs: Reviewed and stable  Last Vitals:  Vitals Value Taken Time  BP 134/91 09/08/20 1619  Temp 36.8 C 09/08/20 1619  Pulse 93 09/08/20 1626  Resp 15 09/08/20 1626  SpO2 95 % 09/08/20 1626  Vitals shown include unvalidated device data.  Last Pain:  Vitals:   09/08/20 1619  TempSrc:   PainSc: Asleep      Patients Stated Pain Goal: 2 (76/14/70 [id number])  Complications: No notable events documented.

## 2020-09-09 ENCOUNTER — Encounter (HOSPITAL_COMMUNITY): Payer: Self-pay | Admitting: Orthopedic Surgery

## 2020-09-09 MED FILL — Thrombin For Soln Kit 20000 Unit: CUTANEOUS | Qty: 1 | Status: AC

## 2020-09-09 NOTE — Anesthesia Postprocedure Evaluation (Signed)
Anesthesia Post Note  Patient: Edrei Norgaard  Procedure(s) Performed: ANTERIOR CERVICAL DECOMPRESSION FUSION CERVICAL 4- CERVICAL5 , CERVICAL 5 - CERVICAL 6 WITH INSTRUMENTATION AND ALLOGRAFT (Spine Cervical)     Patient location during evaluation: PACU Anesthesia Type: General Level of consciousness: awake and alert Pain management: pain level controlled Vital Signs Assessment: post-procedure vital signs reviewed and stable Respiratory status: spontaneous breathing, nonlabored ventilation, respiratory function stable and patient connected to nasal cannula oxygen Cardiovascular status: blood pressure returned to baseline and stable Postop Assessment: no apparent nausea or vomiting Anesthetic complications: no   No notable events documented.  Last Vitals:  Vitals:   09/08/20 1650 09/08/20 1705  BP: 132/87 126/86  Pulse: 85 88  Resp: 16 15  Temp:  36.7 C  SpO2: 95% 92%    Last Pain:  Vitals:   09/08/20 1650  TempSrc:   PainSc: Asleep                 Tiajuana Amass

## 2020-09-10 ENCOUNTER — Other Ambulatory Visit: Payer: Self-pay

## 2020-09-10 ENCOUNTER — Encounter (HOSPITAL_BASED_OUTPATIENT_CLINIC_OR_DEPARTMENT_OTHER): Payer: Self-pay | Admitting: Emergency Medicine

## 2020-09-10 ENCOUNTER — Emergency Department (HOSPITAL_BASED_OUTPATIENT_CLINIC_OR_DEPARTMENT_OTHER)
Admission: EM | Admit: 2020-09-10 | Discharge: 2020-09-10 | Disposition: A | Discharge status: Home / Self Care | Payer: BC Managed Care – PPO | Attending: Emergency Medicine | Admitting: Emergency Medicine

## 2020-09-10 ENCOUNTER — Emergency Department (HOSPITAL_BASED_OUTPATIENT_CLINIC_OR_DEPARTMENT_OTHER): Payer: BC Managed Care – PPO

## 2020-09-10 DIAGNOSIS — F17290 Nicotine dependence, other tobacco product, uncomplicated: Secondary | ICD-10-CM | POA: Diagnosis not present

## 2020-09-10 DIAGNOSIS — R0789 Other chest pain: Secondary | ICD-10-CM | POA: Diagnosis not present

## 2020-09-10 DIAGNOSIS — Z79899 Other long term (current) drug therapy: Secondary | ICD-10-CM | POA: Diagnosis not present

## 2020-09-10 DIAGNOSIS — R0602 Shortness of breath: Secondary | ICD-10-CM | POA: Diagnosis not present

## 2020-09-10 DIAGNOSIS — J90 Pleural effusion, not elsewhere classified: Secondary | ICD-10-CM

## 2020-09-10 DIAGNOSIS — I10 Essential (primary) hypertension: Secondary | ICD-10-CM | POA: Diagnosis not present

## 2020-09-10 DIAGNOSIS — R079 Chest pain, unspecified: Secondary | ICD-10-CM | POA: Diagnosis not present

## 2020-09-10 LAB — CBC WITH DIFFERENTIAL/PLATELET
Abs Immature Granulocytes: 0.02 10*3/uL (ref 0.00–0.07)
Basophils Absolute: 0 10*3/uL (ref 0.0–0.1)
Basophils Relative: 0 %
Eosinophils Absolute: 0.1 10*3/uL (ref 0.0–0.5)
Eosinophils Relative: 1 %
HCT: 43.7 % (ref 39.0–52.0)
Hemoglobin: 14.6 g/dL (ref 13.0–17.0)
Immature Granulocytes: 0 %
Lymphocytes Relative: 26 %
Lymphs Abs: 2.4 10*3/uL (ref 0.7–4.0)
MCH: 30.1 pg (ref 26.0–34.0)
MCHC: 33.4 g/dL (ref 30.0–36.0)
MCV: 90.1 fL (ref 80.0–100.0)
Monocytes Absolute: 0.8 10*3/uL (ref 0.1–1.0)
Monocytes Relative: 8 %
Neutro Abs: 5.9 10*3/uL (ref 1.7–7.7)
Neutrophils Relative %: 65 %
Platelets: 410 10*3/uL — ABNORMAL HIGH (ref 150–400)
RBC: 4.85 MIL/uL (ref 4.22–5.81)
RDW: 13.2 % (ref 11.5–15.5)
WBC: 9.2 10*3/uL (ref 4.0–10.5)
nRBC: 0 % (ref 0.0–0.2)

## 2020-09-10 LAB — COMPREHENSIVE METABOLIC PANEL
ALT: 21 U/L (ref 0–44)
AST: 16 U/L (ref 15–41)
Albumin: 4.1 g/dL (ref 3.5–5.0)
Alkaline Phosphatase: 47 U/L (ref 38–126)
Anion gap: 8 (ref 5–15)
BUN: 12 mg/dL (ref 6–20)
CO2: 25 mmol/L (ref 22–32)
Calcium: 8.8 mg/dL — ABNORMAL LOW (ref 8.9–10.3)
Chloride: 105 mmol/L (ref 98–111)
Creatinine, Ser: 0.96 mg/dL (ref 0.61–1.24)
GFR, Estimated: >60 mL/min (ref >60)
Glucose, Bld: 115 mg/dL — ABNORMAL HIGH (ref 70–99)
Potassium: 3.7 mmol/L (ref 3.5–5.1)
Sodium: 138 mmol/L (ref 135–145)
Total Bilirubin: 0.8 mg/dL (ref 0.3–1.2)
Total Protein: 6.9 g/dL (ref 6.5–8.1)

## 2020-09-10 LAB — TROPONIN I (HIGH SENSITIVITY): Troponin I (High Sensitivity): 5 ng/L (ref <18)

## 2020-09-10 IMAGING — DX DG CHEST 2V
2 series · 2 of 2 positions shown · non-contrast
Comparison: Chest radiographs [DATE] and earlier.

CLINICAL DATA: 45-year-old male with chest pain and shortness of
breath for 1 day. Recent cervical spine surgery.

EXAM:
CHEST - 2 VIEW

[chest pa]
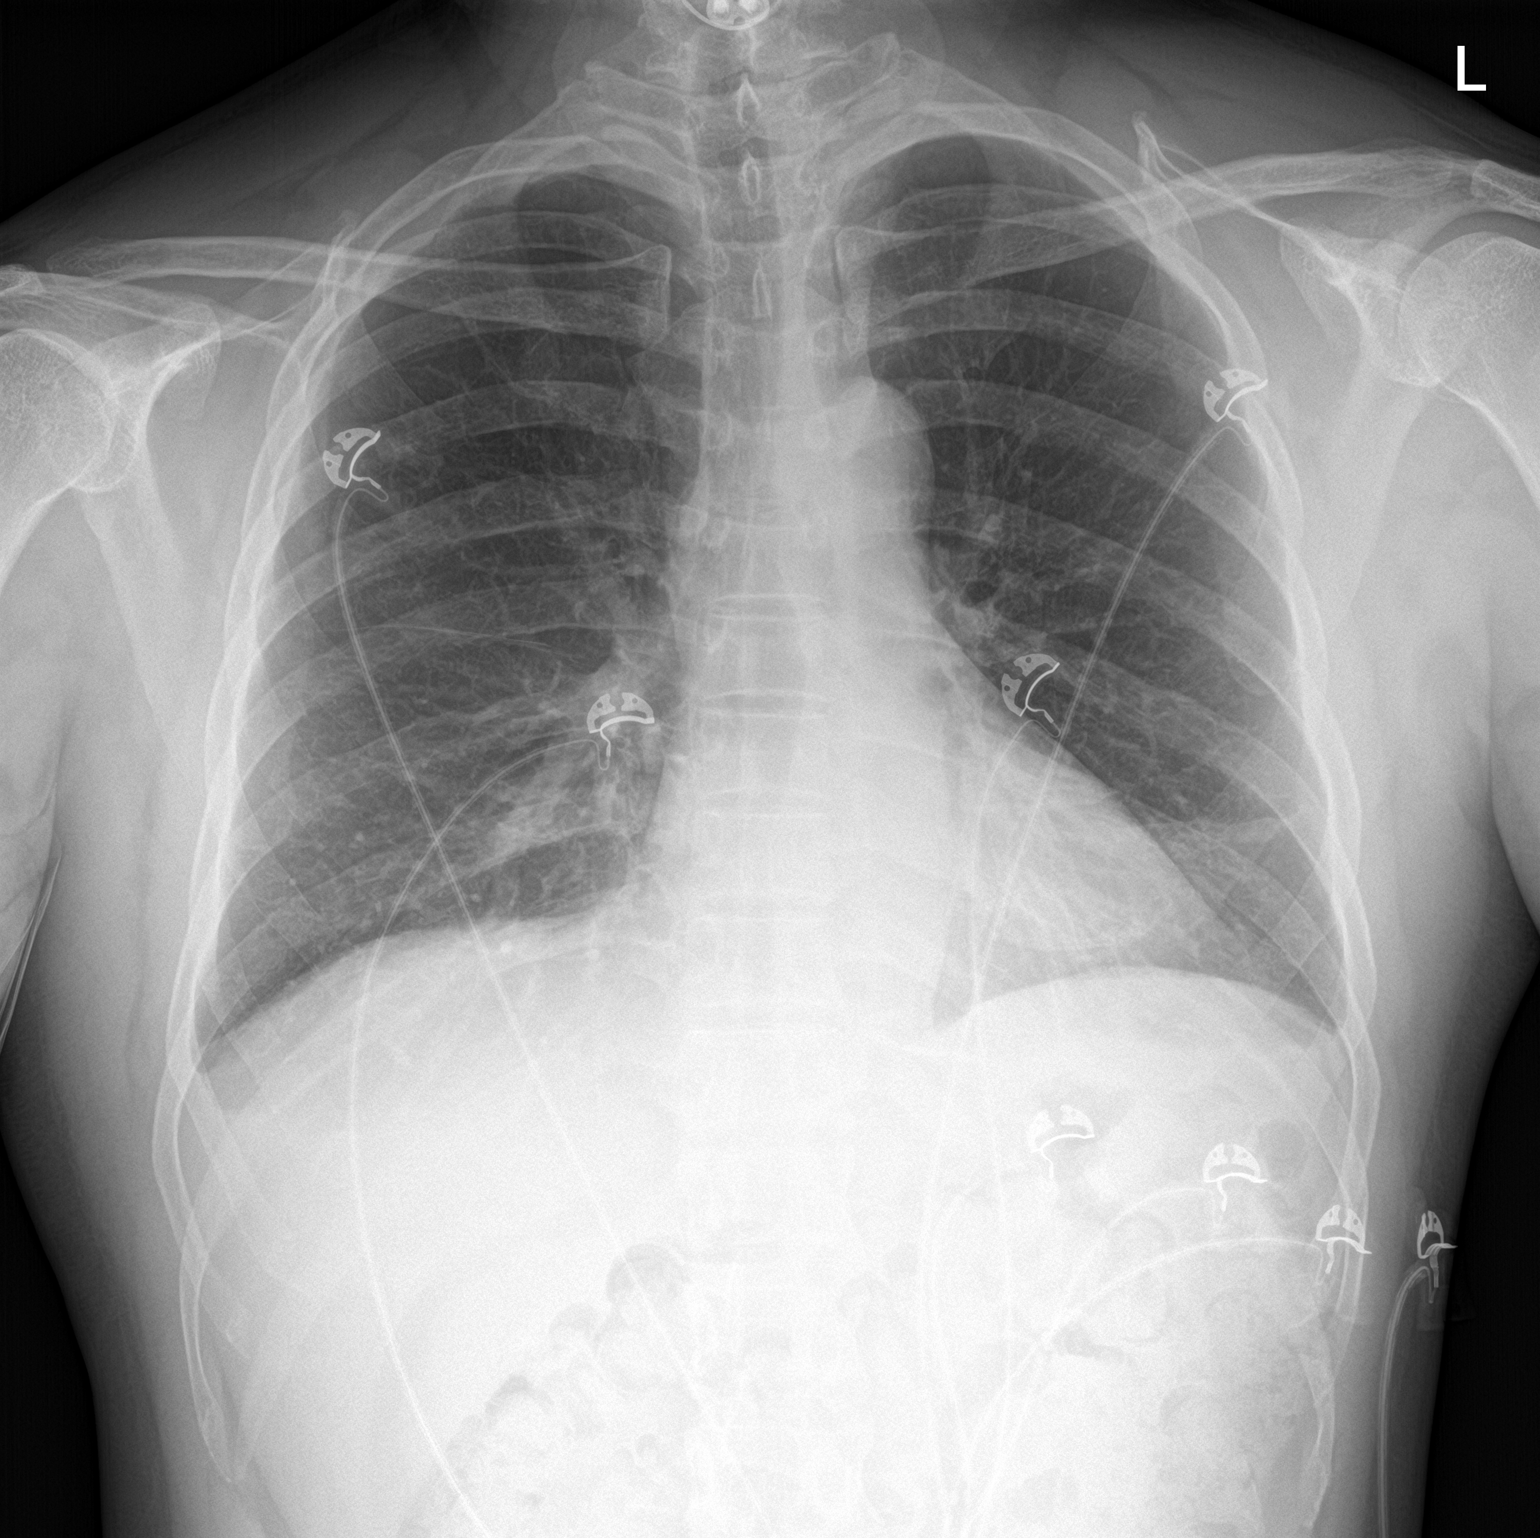

[chest lat]
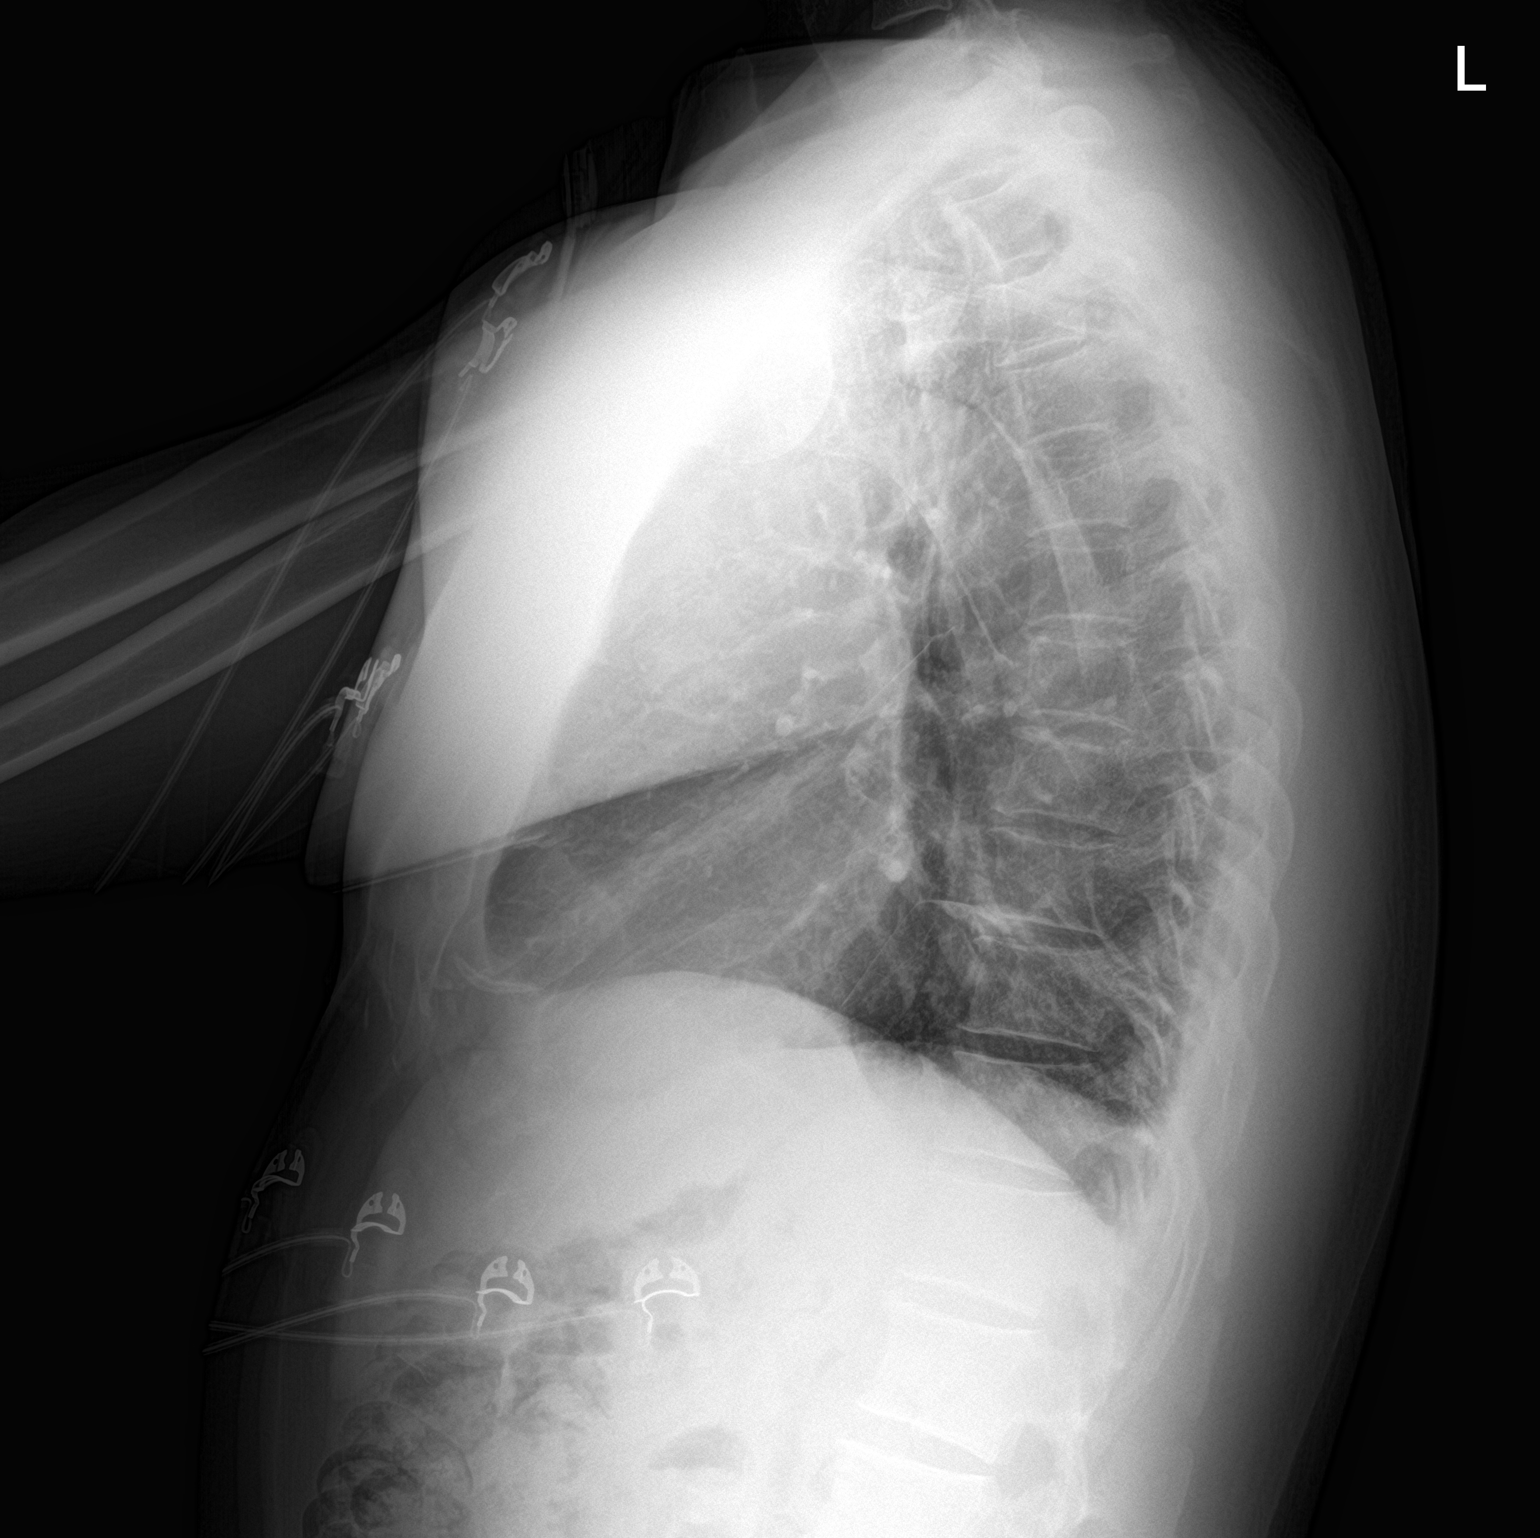

[2 of 2 positions shown; findings below may reference images not displayed]

FINDINGS: PA and lateral views. Lower lung volumes with small bilateral
pleural effusions. No pulmonary edema. No pneumothorax. Cardiac and
mediastinal contours remain normal. Visualized tracheal air column
is within normal limits. No consolidation. No acute osseous
abnormality identified. Partially visible ACDF. Negative visible
bowel gas pattern.
IMPRESSION: Small bilateral pleural effusions and lower lung volumes compared to
priors. No other acute cardiopulmonary abnormality.

## 2020-09-10 NOTE — ED Provider Notes (Signed)
[facility] DE[facility] Provider Note: [name], MD, FACEP  CSN: [id number] MRN: [id number] ARRIVAL: 09/10/20 at 0435 ROOM: [facility]06/[facility]06   CHIEF COMPLAINT  Chest Pain   HISTORY OF PRESENT ILLNESS  09/10/20 4:56 AM Justin Ashley is a 45 y.o. male who underwent an anterior cervical decompression and fusion on 09/08/2020 by Dr. Lynann Bologna.  He remains in a postoperative cervical collar.  Since yesterday he has had pain in his left upper chest (in the region of the pectoralis muscle).  This pain comes and goes without provocation.  When the pain comes he describes it as dull and it is worse with deep breathing.  He rates it as an 8 out of 10 when it occurs.  He is having difficulty breathing by which she means he feels like he is working harder to take a breath.  This is especially true at night when he keeps waking up with a sensation that he cannot breathe but also in part because of his postoperative pain.  His girlfriend, who has been sleeping in the same bedroom, states he has not had unusual snoring or obstructive sleep apnea type symptoms.  She says he does seem to be breathing a little harder than usual.   Past Medical History:  Diagnosis Date   Dislocation of metatarsophalangeal joint of right great toe    Erectile dysfunction 12/24/2012   BCBS will not cover Cialis    Essential hypertension 06/30/2014   GERD (gastroesophageal reflux disease)    Headache    History of kidney stones    History of thrombocytosis 03/09/2013   Hypogonadism in male 12/15/2013   Recheck therapy January 2016    Kidney stone    Pneumonia age 53   Pre-diabetes    Sleep apnea    self report   Suicide attempt ([place]) 12/10/2013    Past Surgical History:  Procedure Laterality Date   ANTERIOR CERVICAL DECOMP/DISCECTOMY FUSION N/A 09/08/2020   Procedure: ANTERIOR CERVICAL DECOMPRESSION FUSION CERVICAL 4- CERVICAL5 , CERVICAL 5 - CERVICAL 6 WITH INSTRUMENTATION AND ALLOGRAFT;  Surgeon: Phylliss Bob, MD;   Location: [facility];  Service: Orthopedics;  Laterality: N/A;   DENTAL SURGERY Left 2018   Has metal maybe titanium   NO PAST SURGERIES      Family History  Problem Relation Age of Onset   Cancer Mother        lung   Colon polyps Mother    Cancer Father        stomach, deceased @  75yrs   Hypertension Father    Colon cancer Father 44   Stomach cancer Father    Other Paternal Uncle        intestional cancer   Colon cancer Paternal Uncle 36   Stomach cancer Cousin    Colon cancer Cousin 46   Heart attack Cousin    Esophageal cancer Neg Hx     Social History   Tobacco Use   Smoking status: Every Day    Pack years: 0.00    Types: E-cigarettes   Smokeless tobacco: Never  Vaping Use   Vaping Use: Every day  Substance Use Topics   Alcohol use: No   Drug use: No    Prior to Admission medications   Medication Sig Start Date End Date Taking? Authorizing Provider  acetaminophen (TYLENOL) 500 MG tablet Take 1,500 mg by mouth 2 (two) times daily as needed for moderate pain.    [provider]  albuterol (VENTOLIN HFA) 108 (90 Base)  MCG/ACT inhaler INHALE 2 PUFFS INTO THE LUNGS EVERY 6 HOURS AS NEEDED FOR WHEEZING OR SHORTNESS OF BREATH Patient taking differently: Inhale 2 puffs into the lungs every 6 (six) hours as needed for wheezing or shortness of breath. 07/14/20   Luetta Nutting, DO  AMBULATORY NON FORMULARY MEDICATION BD 74mL syringe and BD 22G 1 and 1/2" needle, use to inject testosterone every two weeks.  BD 18G 1 and 1/2" needle used to draw up testosterone. Dx: Hypogonadism 06/30/14   Marcial Pacas, DO  clonazePAM (KLONOPIN) 0.5 MG tablet Take 1 tablet (0.5 mg total) by mouth 2 (two) times daily as needed for anxiety. Patient taking differently: Take 0.5 mg by mouth daily as needed for anxiety. 03/17/20   Luetta Nutting, DO  fluticasone (FLONASE) 50 MCG/ACT nasal spray INHALE 1 SPRAY IN EACH NOSTRIL TWICE DAILY( USE LEFT HAND FOR RIGHT NOSTRIL AND RIGHT HAND FOR LEFT  NOSTRIL) Patient taking differently: Place 1 spray into both nostrils 2 (two) times daily as needed for allergies. 02/12/18   Gregor Hams, MD  HYDROcodone-acetaminophen (NORCO/VICODIN) 5-325 MG tablet Take 1 tablet by mouth every 6 (six) hours as needed for moderate pain or severe pain. 09/08/20 09/08/21  McKenzie, Lennie Muckle, PA-C  losartan (COZAAR) 50 MG tablet TAKE 1 TABLET(50 MG) BY MOUTH DAILY Patient taking differently: Take 50 mg by mouth at bedtime. 07/04/20   Luetta Nutting, DO  methocarbamol (ROBAXIN) 500 MG tablet Take 500 mg by mouth every 6 (six) hours as needed for muscle spasms. 08/24/20   [provider]  methocarbamol (ROBAXIN) 500 MG tablet Take 1 tablet (500 mg total) by mouth every 6 (six) hours as needed for muscle spasms. 09/08/20   McKenzie, Lennie Muckle, PA-C  pantoprazole (PROTONIX) 40 MG tablet Take 1 tablet (40 mg total) by mouth daily. Patient taking differently: Take 40 mg by mouth daily as needed (acid reflux). 10/13/19   Jackquline Denmark, MD  pregabalin (LYRICA) 75 MG capsule 1 capsule p.o. twice daily to 3 times daily Patient taking differently: Take 75 mg by mouth 3 (three) times daily as needed (pain). 08/19/20   Silverio Decamp, MD  tadalafil (CIALIS) 20 MG tablet Take 0.5-1 tablets (10-20 mg total) by mouth every other day as needed for erectile dysfunction. 03/23/20   Silverio Decamp, MD  testosterone cypionate (DEPOTESTOSTERONE CYPIONATE) 200 MG/ML injection Inject 1.5 mLs (300 mg total) into the muscle every 14 (fourteen) days. 03/24/20   Luetta Nutting, DO  traMADol (ULTRAM) 50 MG tablet Take 1-2 tablets (50-100 mg total) by mouth every 12 (twelve) hours as needed for moderate pain. Maximum 6 tabs per day. 09/02/20   Silverio Decamp, MD  Ubrogepant (UBRELVY) 50 MG TABS TAKE 50 MG INITIALLY AT ONSET OF MIGRAINE. MAY REPEAT AFTER 2 HOURS IF NEEDED Patient taking differently: Take 50 mg by mouth daily as needed (migraine). MAY REPEAT AFTER 2 HOURS IF  NEEDED 07/25/20   Luetta Nutting, DO    Allergies Hydrocodone-acetaminophen, Lisinopril, Oxycodone-acetaminophen, and Zoloft [sertraline hcl]   REVIEW OF SYSTEMS  Negative except as noted here or in the History of Present Illness.   PHYSICAL EXAMINATION  Initial Vital Signs Blood pressure (!) 154/91, pulse 82, temperature 98.1 F (36.7 C), temperature source Oral, resp. rate 14, height 5\' 8"  (1.727 m), weight 80.3 kg, SpO2 96 %.  Examination General: Well-developed, well-nourished male in no acute distress; appearance consistent with age of record HENT: normocephalic; atraumatic Eyes: pupils equal, round and reactive to light; extraocular muscles intact  Neck: Immobilized in cervical collar Heart: regular rate and rhythm Lungs: clear to auscultation bilaterally Chest: Nontender Abdomen: soft; nondistended; nontender; bowel sounds present Extremities: No deformity; full range of motion; pulses normal Neurologic: Awake, alert and oriented; motor function intact in all extremities and symmetric; no facial droop Skin: Warm and dry Psychiatric: Normal mood and affect   RESULTS  Summary of this visit's results, reviewed and interpreted by myself:   EKG Interpretation  Date/Time:  Saturday September 10 2020 04:44:47 EDT Ventricular Rate:  74 PR Interval:  174 QRS Duration: 91 QT Interval:  364 QTC Calculation: 404 R Axis:   51 Text Interpretation: Sinus rhythm Normal ECG No significant change was found Confirmed by Jakari Jacot, Jenny Reichmann [phone]) on 09/10/2020 4:56:02 AM        Laboratory Studies: Results for orders placed or performed during the hospital encounter of 09/10/20 (from the past 24 hour(s))  Troponin I (High Sensitivity)     Status: None   Collection Time: 09/10/20  4:48 AM  Result Value Ref Range   Troponin I (High Sensitivity) 5 <18 ng/L  CBC with Differential     Status: Abnormal   Collection Time: 09/10/20  4:48 AM  Result Value Ref Range   WBC 9.2 4.0 - 10.5 K/uL    RBC 4.85 4.22 - 5.81 MIL/uL   Hemoglobin 14.6 13.0 - 17.0 g/dL   HCT 43.7 39.0 - 52.0 %   MCV 90.1 80.0 - 100.0 fL   MCH 30.1 26.0 - 34.0 pg   MCHC 33.4 30.0 - 36.0 g/dL   RDW 13.2 11.5 - 15.5 %   Platelets 410 (H) 150 - 400 K/uL   nRBC 0.0 0.0 - 0.2 %   Neutrophils Relative % 65 %   Neutro Abs 5.9 1.7 - 7.7 K/uL   Lymphocytes Relative 26 %   Lymphs Abs 2.4 0.7 - 4.0 K/uL   Monocytes Relative 8 %   Monocytes Absolute 0.8 0.1 - 1.0 K/uL   Eosinophils Relative 1 %   Eosinophils Absolute 0.1 0.0 - 0.5 K/uL   Basophils Relative 0 %   Basophils Absolute 0.0 0.0 - 0.1 K/uL   Immature Granulocytes 0 %   Abs Immature Granulocytes 0.02 0.00 - 0.07 K/uL  Comprehensive metabolic panel     Status: Abnormal   Collection Time: 09/10/20  4:48 AM  Result Value Ref Range   Sodium 138 135 - 145 mmol/L   Potassium 3.7 3.5 - 5.1 mmol/L   Chloride 105 98 - 111 mmol/L   CO2 25 22 - 32 mmol/L   Glucose, Bld 115 (H) 70 - 99 mg/dL   BUN 12 6 - 20 mg/dL   Creatinine, Ser 0.96 0.61 - 1.24 mg/dL   Calcium 8.8 (L) 8.9 - 10.3 mg/dL   Total Protein 6.9 6.5 - 8.1 g/dL   Albumin 4.1 3.5 - 5.0 g/dL   AST 16 15 - 41 U/L   ALT 21 0 - 44 U/L   Alkaline Phosphatase 47 38 - 126 U/L   Total Bilirubin 0.8 0.3 - 1.2 mg/dL   GFR, Estimated >60 >60 mL/min   Anion gap 8 5 - 15   Imaging Studies: DG Chest 2 View  Result Date: 09/10/2020 CLINICAL DATA:  45 year old male with chest pain and shortness of breath for 1 day. Recent cervical spine surgery. EXAM: CHEST - 2 VIEW COMPARISON:  Chest radiographs 03/10/2018 and earlier. FINDINGS: PA and lateral views. Lower lung volumes with small bilateral pleural effusions. No pulmonary edema.  No pneumothorax. Cardiac and mediastinal contours remain normal. Visualized tracheal air column is within normal limits. No consolidation. No acute osseous abnormality identified. Partially visible ACDF. Negative visible bowel gas pattern. IMPRESSION: Small bilateral pleural effusions and  lower lung volumes compared to priors. No other acute cardiopulmonary abnormality. Electronically Signed   By: Genevie Ann M.D.   On: 09/10/2020 06:11   DG Cervical Spine 1 View  Result Date: 09/08/2020 CLINICAL DATA:  ACDF C4-C5, C5-C6. EXAM: DG C-ARM 1-60 MIN; DG CERVICAL SPINE - 1 VIEW FLUOROSCOPY TIME:  Fluoroscopy Time:  10 seconds. Radiation Exposure Index (if provided by the fluoroscopic device): 2.10 mGy. Number of Acquired Spot Images: 3 COMPARISON:  MRI cervical spine 04/03/2020. FINDINGS: Three C-arm fluoroscopic images were obtained intraoperatively and submitted for post operative interpretation. The first image demonstrates surgical probe projecting anteriorly at the C5 level. The second image demonstrates a needle tip projecting anteriorly at the C5-C6 interspace. Final image demonstrates anterior plate and screw fixation spanning C4-C6 with intervening C4-C5 and C5-C6 spacers. No unexpected findings. Please see the performing provider's procedural report for further detail. IMPRESSION: Intraoperative imaging during C4-C6 ACDF. Electronically Signed   By: Margaretha Sheffield MD   On: 09/08/2020 16:29   DG C-Arm 1-60 Min  Result Date: 09/08/2020 CLINICAL DATA:  ACDF C4-C5, C5-C6. EXAM: DG C-ARM 1-60 MIN; DG CERVICAL SPINE - 1 VIEW FLUOROSCOPY TIME:  Fluoroscopy Time:  10 seconds. Radiation Exposure Index (if provided by the fluoroscopic device): 2.10 mGy. Number of Acquired Spot Images: 3 COMPARISON:  MRI cervical spine 04/03/2020. FINDINGS: Three C-arm fluoroscopic images were obtained intraoperatively and submitted for post operative interpretation. The first image demonstrates surgical probe projecting anteriorly at the C5 level. The second image demonstrates a needle tip projecting anteriorly at the C5-C6 interspace. Final image demonstrates anterior plate and screw fixation spanning C4-C6 with intervening C4-C5 and C5-C6 spacers. No unexpected findings. Please see the performing provider's  procedural report for further detail. IMPRESSION: Intraoperative imaging during C4-C6 ACDF. Electronically Signed   By: Margaretha Sheffield MD   On: 09/08/2020 16:29    ED COURSE and MDM  Nursing notes, initial and subsequent vitals signs, including pulse oximetry, reviewed and interpreted by myself.  Vitals:   09/10/20 0441 09/10/20 0448 09/10/20 0500  BP:  (!) 154/91 (!) 152/100  Pulse:  82 82  Resp:  14 17  Temp:  98.1 F (36.7 C)   TempSrc:  Oral   SpO2:  96% 97%  Weight: 80.3 kg    Height: 5\' 8"  (1.727 m)     Medications - No data to display  I have a low index of suspicion for PE given his oxygen saturation is 100% on room air and he is not tachycardic.  The pain in his chest is not sharp and is intermittent, coming in waves, which is atypical.  There is a pleuritic component but this may be related to the small effusions he has.  He has been ambulatory since the surgery and not been bed ridden.  I suspect he may be having difficulty taking deep breaths because of the constricting cervical collar he is in and he states that he had the same concerns.  We will provide him an incentive spirometer.  He was advised to return for worsening symptoms.  PROCEDURES  Procedures   ED DIAGNOSES     ICD-10-CM   1. Atypical chest pain  R07.89     2. Pleural effusion, bilateral  J90  Guiliana Shor, Jenny Reichmann, MD 09/10/20 [phone]

## 2020-09-10 NOTE — ED Triage Notes (Signed)
Pt states left side chest pain X 1 day, pt states unable to sleep when tries to lay down has hard time breathing.

## 2020-09-14 ENCOUNTER — Encounter: Payer: Self-pay | Admitting: Family Medicine

## 2020-09-14 ENCOUNTER — Other Ambulatory Visit: Payer: Self-pay

## 2020-09-14 ENCOUNTER — Ambulatory Visit: Payer: BC Managed Care – PPO | Admitting: Family Medicine

## 2020-09-14 VITALS — BP 119/86 | HR 96 | Ht 68.0 in | Wt 171.0 lb

## 2020-09-14 DIAGNOSIS — E7800 Pure hypercholesterolemia, unspecified: Secondary | ICD-10-CM

## 2020-09-14 DIAGNOSIS — G43809 Other migraine, not intractable, without status migrainosus: Secondary | ICD-10-CM

## 2020-09-14 DIAGNOSIS — E291 Testicular hypofunction: Secondary | ICD-10-CM | POA: Diagnosis not present

## 2020-09-14 DIAGNOSIS — F410 Panic disorder [episodic paroxysmal anxiety] without agoraphobia: Secondary | ICD-10-CM

## 2020-09-14 DIAGNOSIS — M5030 Other cervical disc degeneration, unspecified cervical region: Secondary | ICD-10-CM

## 2020-09-14 MED ORDER — TRAZODONE HCL 50 MG PO TABS: 50.0000 mg | ORAL_TABLET | Freq: Every evening | ORAL | 1 refills | Status: DC | PRN

## 2020-09-14 MED ORDER — TESTOSTERONE CYPIONATE 200 MG/ML IM SOLN
300.0000 mg | Freq: Once | INTRAMUSCULAR | Status: AC
  Administered 2020-09-14: 300 mg via INTRAMUSCULAR

## 2020-09-14 NOTE — Assessment & Plan Note (Signed)
He is doing well with Ubrelvy as needed.  We will plan to continue this

## 2020-09-14 NOTE — Progress Notes (Signed)
Justin Ashley - 45 y.o. male MRN [id number]  Date of birth: 12-Apr-1975  Subjective No chief complaint on file.   HPI Justin Ashley is a 45 year old male here today for follow-up visit.  He recently had anterior cervical decompression with fusion.  He was having some mild dyspnea shortly after was seen in the emergency room.  He did have small bilateral pleural effusions noted on x-ray.  This has resolved at this point.  He states overall he is doing well from this.  He is planning on weaning from hydrocodone to tramadol.  He reports that migraines have been doing well.  He does use Ubrelvy as needed.  No noticeable side effects from this.  He does have clonazepam that he uses as needed for episodes of anxiety or panic.  This continues to work well for him.  He has been using a little more frequently recently to help with sleep.  He would be interested in trying something else to help with his insomnia.  He feels like he is doing well with his current dosing of testosterone.  ROS:  A comprehensive ROS was completed and negative except as noted per HPI  Allergies  Allergen Reactions   Hydrocodone-Acetaminophen Nausea And Vomiting   Lisinopril Cough    Cough   Oxycodone-Acetaminophen Nausea And Vomiting   Zoloft [Sertraline Hcl]     Suicide attempt    Past Medical History:  Diagnosis Date   Dislocation of metatarsophalangeal joint of right great toe    Erectile dysfunction 12/24/2012   BCBS will not cover Cialis    Essential hypertension 06/30/2014   GERD (gastroesophageal reflux disease)    Headache    History of kidney stones    History of thrombocytosis 03/09/2013   Hypogonadism in male 12/15/2013   Recheck therapy January 2016    Kidney stone    Pneumonia age 69   Pre-diabetes    Sleep apnea    self report   Suicide attempt ([place]) 12/10/2013    Past Surgical History:  Procedure Laterality Date   ANTERIOR CERVICAL DECOMP/DISCECTOMY FUSION N/A 09/08/2020   Procedure: ANTERIOR  CERVICAL DECOMPRESSION FUSION CERVICAL 4- CERVICAL5 , CERVICAL 5 - CERVICAL 6 WITH INSTRUMENTATION AND ALLOGRAFT;  Surgeon: Phylliss Bob, MD;  Location: [facility];  Service: Orthopedics;  Laterality: N/A;   DENTAL SURGERY Left 2018   Has metal maybe titanium   NO PAST SURGERIES      Social History   Socioeconomic History   Marital status: Significant Other    Spouse name: Not on file   Number of children: Not on file   Years of education: Not on file   Highest education level: Not on file  Occupational History   Not on file  Tobacco Use   Smoking status: Every Day    Pack years: 0.00    Types: E-cigarettes   Smokeless tobacco: Never  Vaping Use   Vaping Use: Every day  Substance and Sexual Activity   Alcohol use: No   Drug use: No   Sexual activity: Yes  Other Topics Concern   Not on file  Social History Narrative   Not on file   Social Determinants of Health   Financial Resource Strain: Not on file  Food Insecurity: Not on file  Transportation Needs: Not on file  Physical Activity: Not on file  Stress: Not on file  Social Connections: Not on file    Family History  Problem Relation Age of Onset   Cancer Mother  lung   Colon polyps Mother    Cancer Father        stomach, deceased @  78yrs   Hypertension Father    Colon cancer Father 71   Stomach cancer Father    Other Paternal Uncle        intestional cancer   Colon cancer Paternal Uncle 63   Stomach cancer Cousin    Colon cancer Cousin 21   Heart attack Cousin    Esophageal cancer Neg Hx     Health Maintenance  Topic Date Due   Pneumococcal Vaccine 74-20 Years old (1 - PCV) Never done   Hepatitis C Screening  Never done   COVID-19 Vaccine (3 - Moderna risk series) 07/27/2019   INFLUENZA VACCINE  10/10/2020   COLONOSCOPY (Pts 45-24yrs Insurance coverage will need to be confirmed)  10/11/2024   TETANUS/TDAP  05/02/2026   HIV Screening  Completed   HPV VACCINES  Aged Out      ----------------------------------------------------------------------------------------------------------------------------------------------------------------------------------------------------------------- Physical Exam BP 119/86   Pulse 96   Ht 5\' 8"  (1.727 m)   Wt 171 lb (77.6 kg)   BMI 26.00 kg/m   Physical Exam Constitutional:      Appearance: Normal appearance.  Eyes:     General: No scleral icterus. Neck:     Comments: Surgical wound C/D/I. His dressing has come off and I replaced this today with sterile 2 x 2's and cloth tape. Cardiovascular:     Rate and Rhythm: Normal rate and regular rhythm.  Skin:    General: Skin is warm and dry.  Neurological:     General: No focal deficit present.     Mental Status: He is alert.  Psychiatric:        Mood and Affect: Mood normal.        Behavior: Behavior normal.    ------------------------------------------------------------------------------------------------------------------------------------------------------------------------------------------------------------------- Assessment and Plan  Migraine He is doing well with Justin Ashley as needed.  We will plan to continue this  Degenerative disc disease, cervical Status post decompression and fusion.  He seems to be doing well at this point.  He has follow-up with surgeon on July 20.  Hypogonadism in male Feels good with current dosing of testosterone.  Update testosterone levels and PSA in 1 week.  Injection given today.  Panic attacks Using clonazepam as needed.  He has been using recently for insomnia as well I am going to add trazodone in place of clonazepam for his insomnia.  He may continue to use this for panic.   Meds ordered this encounter  Medications   testosterone cypionate (DEPOTESTOSTERONE CYPIONATE) injection 300 mg   traZODone (DESYREL) 50 MG tablet    Sig: Take 1-2 tablets (50-100 mg total) by mouth at bedtime as needed for sleep.    Dispense:   90 tablet    Refill:  1    Return in about 1 week (around 09/21/2020) for lab visit.    This visit occurred during the SARS-CoV-2 public health emergency.  Safety protocols were in place, including screening questions prior to the visit, additional usage of staff PPE, and extensive cleaning of exam room while observing appropriate contact time as indicated for disinfecting solutions.

## 2020-09-14 NOTE — Assessment & Plan Note (Signed)
Using clonazepam as needed.  He has been using recently for insomnia as well I am going to add trazodone in place of clonazepam for his insomnia.  He may continue to use this for panic.

## 2020-09-14 NOTE — Assessment & Plan Note (Signed)
Feels good with current dosing of testosterone.  Update testosterone levels and PSA in 1 week.  Injection given today.

## 2020-09-14 NOTE — Patient Instructions (Addendum)
Great to see you today! Have labs completed in 1 week Let me know about trazodone after a few weeks.

## 2020-09-14 NOTE — Assessment & Plan Note (Signed)
Status post decompression and fusion.  He seems to be doing well at this point.  He has follow-up with surgeon on July 20.

## 2020-09-16 DIAGNOSIS — Z981 Arthrodesis status: Secondary | ICD-10-CM | POA: Diagnosis not present

## 2020-09-16 DIAGNOSIS — M5030 Other cervical disc degeneration, unspecified cervical region: Secondary | ICD-10-CM | POA: Diagnosis not present

## 2020-09-22 ENCOUNTER — Other Ambulatory Visit: Payer: BC Managed Care – PPO

## 2020-09-22 ENCOUNTER — Other Ambulatory Visit: Payer: Self-pay

## 2020-09-22 DIAGNOSIS — E291 Testicular hypofunction: Secondary | ICD-10-CM | POA: Diagnosis not present

## 2020-09-22 DIAGNOSIS — E7800 Pure hypercholesterolemia, unspecified: Secondary | ICD-10-CM | POA: Diagnosis not present

## 2020-09-23 LAB — TESTOSTERONE: Testosterone: 637 ng/dL (ref 250–827)

## 2020-09-23 LAB — LIPID PANEL W/REFLEX DIRECT LDL
Cholesterol: 202 mg/dL — ABNORMAL HIGH (ref <200)
HDL: 46 mg/dL (ref >=40)
LDL Cholesterol (Calc): 128 mg/dL (calc) — ABNORMAL HIGH
Non-HDL Cholesterol (Calc): 156 mg/dL (calc) — ABNORMAL HIGH (ref <130)
Total CHOL/HDL Ratio: 4.4 (calc) (ref <5.0)
Triglycerides: 165 mg/dL — ABNORMAL HIGH (ref <150)

## 2020-09-23 LAB — PSA: PSA: 1.77 ng/mL (ref <=4.00)

## 2020-09-26 NOTE — Telephone Encounter (Signed)
Nurse visit has been scheduled for Wednesday. AM

## 2020-09-28 ENCOUNTER — Ambulatory Visit (INDEPENDENT_AMBULATORY_CARE_PROVIDER_SITE_OTHER): Payer: BC Managed Care – PPO | Admitting: Family Medicine

## 2020-09-28 ENCOUNTER — Other Ambulatory Visit: Payer: Self-pay

## 2020-09-28 VITALS — BP 141/98 | HR 78 | Resp 20 | Ht 68.0 in | Wt 171.0 lb

## 2020-09-28 DIAGNOSIS — E291 Testicular hypofunction: Secondary | ICD-10-CM | POA: Diagnosis not present

## 2020-09-28 DIAGNOSIS — Z9889 Other specified postprocedural states: Secondary | ICD-10-CM | POA: Diagnosis not present

## 2020-09-28 MED ORDER — TESTOSTERONE CYPIONATE 200 MG/ML IM SOLN
300.0000 mg | Freq: Once | INTRAMUSCULAR | Status: AC
  Administered 2020-09-28: 300 mg via INTRAMUSCULAR

## 2020-09-28 NOTE — Progress Notes (Signed)
Established Patient Office Visit  Subjective:  Patient ID: Justin Ashley, male    DOB: 1975/11/26  Age: 45 y.o. MRN: [id number]  CC:  Chief Complaint  Patient presents with   Hypogonadism    HPI Justin Ashley presents for testosterone injection. Denies chest pain, shortness of breath, headaches, and problems with medication or mood changes.    Patient tolerated testosterone 300 mg injection to LUOQ well without complications. Patient advised to schedule next injection in 14 days if needed.  Past Medical History:  Diagnosis Date   Dislocation of metatarsophalangeal joint of right great toe    Erectile dysfunction 12/24/2012   BCBS will not cover Cialis    Essential hypertension 06/30/2014   GERD (gastroesophageal reflux disease)    Headache    History of kidney stones    History of thrombocytosis 03/09/2013   Hypogonadism in male 12/15/2013   Recheck therapy January 2016    Kidney stone    Pneumonia age 1   Pre-diabetes    Sleep apnea    self report   Suicide attempt ([place]) 12/10/2013    Past Surgical History:  Procedure Laterality Date   ANTERIOR CERVICAL DECOMP/DISCECTOMY FUSION N/A 09/08/2020   Procedure: ANTERIOR CERVICAL DECOMPRESSION FUSION CERVICAL 4- CERVICAL5 , CERVICAL 5 - CERVICAL 6 WITH INSTRUMENTATION AND ALLOGRAFT;  Surgeon: Phylliss Bob, MD;  Location: [facility];  Service: Orthopedics;  Laterality: N/A;   DENTAL SURGERY Left 2018   Has metal maybe titanium   NO PAST SURGERIES      Family History  Problem Relation Age of Onset   Cancer Mother        lung   Colon polyps Mother    Cancer Father        stomach, deceased @  35yrs   Hypertension Father    Colon cancer Father 6   Stomach cancer Father    Other Paternal Uncle        intestional cancer   Colon cancer Paternal Uncle 43   Stomach cancer Cousin    Colon cancer Cousin 77   Heart attack Cousin    Esophageal cancer Neg Hx     Social History   Socioeconomic History   Marital status:  Significant Other    Spouse name: Not on file   Number of children: Not on file   Years of education: Not on file   Highest education level: Not on file  Occupational History   Not on file  Tobacco Use   Smoking status: Every Day    Types: E-cigarettes   Smokeless tobacco: Never  Vaping Use   Vaping Use: Every day  Substance and Sexual Activity   Alcohol use: No   Drug use: No   Sexual activity: Yes  Other Topics Concern   Not on file  Social History Narrative   Not on file   Social Determinants of Health   Financial Resource Strain: Not on file  Food Insecurity: Not on file  Transportation Needs: Not on file  Physical Activity: Not on file  Stress: Not on file  Social Connections: Not on file  Intimate Partner Violence: Not on file    Outpatient Medications Prior to Visit  Medication Sig Dispense Refill   albuterol (VENTOLIN HFA) 108 (90 Base) MCG/ACT inhaler INHALE 2 PUFFS INTO THE LUNGS EVERY 6 HOURS AS NEEDED FOR WHEEZING OR SHORTNESS OF BREATH (Patient taking differently: Inhale 2 puffs into the lungs every 6 (six) hours as needed for wheezing or shortness of breath.)  6.7 g 0   AMBULATORY NON FORMULARY MEDICATION BD 38mL syringe and BD 22G 1 and 1/2" needle, use to inject testosterone every two weeks.  BD 18G 1 and 1/2" needle used to draw up testosterone. Dx: Hypogonadism 20 Units 11   clonazePAM (KLONOPIN) 0.5 MG tablet Take 1 tablet (0.5 mg total) by mouth 2 (two) times daily as needed for anxiety. (Patient taking differently: Take 0.5 mg by mouth daily as needed for anxiety.) 30 tablet 0   fluticasone (FLONASE) 50 MCG/ACT nasal spray INHALE 1 SPRAY IN EACH NOSTRIL TWICE DAILY( USE LEFT HAND FOR RIGHT NOSTRIL AND RIGHT HAND FOR LEFT NOSTRIL) (Patient taking differently: Place 1 spray into both nostrils 2 (two) times daily as needed for allergies.) 48 g 0   HYDROcodone-acetaminophen (NORCO/VICODIN) 5-325 MG tablet Take 1 tablet by mouth every 6 (six) hours as needed for  moderate pain or severe pain. 20 tablet 0   losartan (COZAAR) 50 MG tablet TAKE 1 TABLET(50 MG) BY MOUTH DAILY (Patient taking differently: Take 50 mg by mouth at bedtime.) 90 tablet 1   methocarbamol (ROBAXIN) 500 MG tablet Take 1 tablet (500 mg total) by mouth every 6 (six) hours as needed for muscle spasms. 60 tablet 0   pantoprazole (PROTONIX) 40 MG tablet Take 1 tablet (40 mg total) by mouth daily. (Patient taking differently: Take 40 mg by mouth daily as needed (acid reflux).) 90 tablet 3   pregabalin (LYRICA) 75 MG capsule 1 capsule p.o. twice daily to 3 times daily (Patient taking differently: Take 75 mg by mouth 3 (three) times daily as needed (pain).) 90 capsule 3   tadalafil (CIALIS) 20 MG tablet Take 0.5-1 tablets (10-20 mg total) by mouth every other day as needed for erectile dysfunction. 30 tablet 11   testosterone cypionate (DEPOTESTOSTERONE CYPIONATE) 200 MG/ML injection Inject 1.5 mLs (300 mg total) into the muscle every 14 (fourteen) days. 10 mL 3   traZODone (DESYREL) 50 MG tablet Take 1-2 tablets (50-100 mg total) by mouth at bedtime as needed for sleep. 90 tablet 1   Ubrogepant (UBRELVY) 50 MG TABS TAKE 50 MG INITIALLY AT ONSET OF MIGRAINE. MAY REPEAT AFTER 2 HOURS IF NEEDED (Patient taking differently: Take 50 mg by mouth daily as needed (migraine). MAY REPEAT AFTER 2 HOURS IF NEEDED) 12 tablet 3   Facility-Administered Medications Prior to Visit  Medication Dose Route Frequency Provider Last Rate Last Admin   0.9 %  sodium chloride infusion  500 mL Intravenous Once Jackquline Denmark, MD        Allergies  Allergen Reactions   Hydrocodone-Acetaminophen Nausea And Vomiting   Lisinopril Cough    Cough   Oxycodone-Acetaminophen Nausea And Vomiting   Zoloft [Sertraline Hcl]     Suicide attempt    ROS Review of Systems    Objective:    Physical Exam  BP (!) 141/98 (BP Location: Left Arm, Patient Position: Sitting, Cuff Size: Normal)   Pulse 78   Resp 20   Ht 5\' 8"   (1.727 m)   Wt 171 lb (77.6 kg)   SpO2 98%   BMI 26.00 kg/m  Wt Readings from Last 3 Encounters:  09/28/20 171 lb (77.6 kg)  09/14/20 171 lb (77.6 kg)  09/10/20 177 lb (80.3 kg)     Health Maintenance Due  Topic Date Due   Pneumococcal Vaccine 25-21 Years old (1 - PCV) Never done   Hepatitis C Screening  Never done   COVID-19 Vaccine (3 - Moderna risk series) 07/27/2019  There are no preventive care reminders to display for this patient.  Lab Results  Component Value Date   TSH 0.91 08/27/2019   Lab Results  Component Value Date   WBC 9.2 09/10/2020   HGB 14.6 09/10/2020   HCT 43.7 09/10/2020   MCV 90.1 09/10/2020   PLT 410 (H) 09/10/2020   Lab Results  Component Value Date   NA 138 09/10/2020   K 3.7 09/10/2020   CO2 25 09/10/2020   GLUCOSE 115 (H) 09/10/2020   BUN 12 09/10/2020   CREATININE 0.96 09/10/2020   BILITOT 0.8 09/10/2020   ALKPHOS 47 09/10/2020   AST 16 09/10/2020   ALT 21 09/10/2020   PROT 6.9 09/10/2020   ALBUMIN 4.1 09/10/2020   CALCIUM 8.8 (L) 09/10/2020   ANIONGAP 8 09/10/2020   GFR 70.56 08/27/2019   Lab Results  Component Value Date   CHOL 202 (H) 09/22/2020   Lab Results  Component Value Date   HDL 46 09/22/2020   Lab Results  Component Value Date   LDLCALC 128 (H) 09/22/2020   Lab Results  Component Value Date   TRIG 165 (H) 09/22/2020   Lab Results  Component Value Date   CHOLHDL 4.4 09/22/2020   Lab Results  Component Value Date   HGBA1C 5.6 01/12/2019      Assessment & Plan:  Patient tolerated testosterone 300 mg injection to LUOQ well without complications. Patient advised to schedule next injection in 14 days if needed. Problem List Items Addressed This Visit       Endocrine   Hypogonadism in male - Primary (Chronic)   Relevant Medications   testosterone cypionate (DEPOTESTOSTERONE CYPIONATE) injection 300 mg (Start on 09/28/2020 10:45 AM)    Meds ordered this encounter  Medications   testosterone  cypionate (DEPOTESTOSTERONE CYPIONATE) injection 300 mg    Follow-up: No follow-ups on file.    Ninfa Meeker, CMA

## 2020-09-28 NOTE — Progress Notes (Signed)
Medical screening examination/treatment was performed by qualified clinical staff member and as supervising physician I was immediately available for consultation/collaboration. I have reviewed documentation and agree with assessment and plan.  Dalton Mille, DO  

## 2020-10-13 ENCOUNTER — Other Ambulatory Visit: Payer: Self-pay | Admitting: Gastroenterology

## 2020-10-13 DIAGNOSIS — B9681 Helicobacter pylori [H. pylori] as the cause of diseases classified elsewhere: Secondary | ICD-10-CM

## 2020-10-13 DIAGNOSIS — K2970 Gastritis, unspecified, without bleeding: Secondary | ICD-10-CM

## 2020-10-21 DIAGNOSIS — Z9889 Other specified postprocedural states: Secondary | ICD-10-CM | POA: Diagnosis not present

## 2020-11-07 ENCOUNTER — Ambulatory Visit: Payer: BC Managed Care – PPO | Admitting: Rehabilitative and Restorative Service Providers"

## 2020-11-07 ENCOUNTER — Other Ambulatory Visit: Payer: Self-pay

## 2020-11-07 ENCOUNTER — Encounter: Payer: Self-pay | Admitting: Rehabilitative and Restorative Service Providers"

## 2020-11-07 DIAGNOSIS — M542 Cervicalgia: Secondary | ICD-10-CM | POA: Diagnosis not present

## 2020-11-07 DIAGNOSIS — R293 Abnormal posture: Secondary | ICD-10-CM | POA: Diagnosis not present

## 2020-11-07 DIAGNOSIS — M6281 Muscle weakness (generalized): Secondary | ICD-10-CM | POA: Diagnosis not present

## 2020-11-07 DIAGNOSIS — R29898 Other symptoms and signs involving the musculoskeletal system: Secondary | ICD-10-CM | POA: Diagnosis not present

## 2020-11-07 NOTE — Addendum Note (Signed)
Addended by: Everardo All on: 11/07/2020 04:18 PM   Modules accepted: Orders

## 2020-11-07 NOTE — Therapy (Signed)
[place], [place], [address] Phone: [phone]   Fax:  [phone]  Physical Therapy Evaluation  Patient Details  Name: Justin Ashley MRN: [id number] Date of Birth: 1975/11/30 Referring Provider (PT): Dr Phylliss Bob   Encounter Date: 11/07/2020   PT End of Session - 11/07/20 1601     Visit Number 1    Number of Visits 12    Date for PT Re-Evaluation 12/19/20    PT Start Time 1517    PT Stop Time 1600    PT Time Calculation (min) 43 min    Activity Tolerance Patient tolerated treatment well             Past Medical History:  Diagnosis Date   Dislocation of metatarsophalangeal joint of right great toe    Erectile dysfunction 12/24/2012   BCBS will not cover Cialis    Essential hypertension 06/30/2014   GERD (gastroesophageal reflux disease)    Headache    History of kidney stones    History of thrombocytosis 03/09/2013   Hypogonadism in male 12/15/2013   Recheck therapy January 2016    Kidney stone    Pneumonia age 45   Pre-diabetes    Sleep apnea    self report   Suicide attempt ([place]) 12/10/2013    Past Surgical History:  Procedure Laterality Date   ANTERIOR CERVICAL DECOMP/DISCECTOMY FUSION N/A 09/08/2020   Procedure: ANTERIOR CERVICAL DECOMPRESSION FUSION CERVICAL 4- CERVICAL5 , CERVICAL 5 - CERVICAL 6 WITH INSTRUMENTATION AND ALLOGRAFT;  Surgeon: Phylliss Bob, MD;  Location: [facility];  Service: Orthopedics;  Laterality: N/A;   DENTAL SURGERY Left 2018   Has metal maybe titanium   NO PAST SURGERIES      There were no vitals filed for this visit.    Subjective Assessment - 11/07/20 1522     Subjective Patient reports that he had neck pain for the past 7 years withprogressive symptoms, worse in the past year. He underwent anterior cervical decompression with fusion C4/5/6 on 09/08/20 with puleral effusion with treatment which resolved the symptoms in ~ 3-4 days. He reports  that his neck has felt better following surgery. Rt UE pain has resolved.    Pertinent History history of cervical dysfunction for years; HTN; LBP    Patient Stated Goals wants to be able to look up and increase mobility as well as get rid of the pain in the neck area    Currently in Pain? Yes    Pain Score 2     Pain Location Neck    Pain Orientation Right    Pain Descriptors / Indicators Nagging    Pain Type Chronic pain;Surgical pain    Pain Radiating Towards Rt UE radicular pain resolved with surgery    Pain Onset More than a month ago    Pain Frequency Intermittent    Aggravating Factors  looking up; reaching high    Pain Relieving Factors nothing - meds cover symptoms                [facility] PT Assessment - 11/07/20 0001       Assessment   Medical Diagnosis Cervial dysfunction    Referring Provider (PT) Dr Phylliss Bob    Onset Date/Surgical Date 09/08/20   pain x 7 yrs worse in the past year   Hand Dominance Left    Next MD Visit PRN    Prior Therapy here for 3 visits prior to surgery  Precautions   Precautions Cervical    Precaution Comments lifting restrictions but not sure how much lifting; carefful with incision in sun      Balance Screen   Has the patient fallen in the past 6 months No    Has the patient had a decrease in activity level because of a fear of falling?  No    Is the patient reluctant to leave their home because of a fear of falling?  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; bent forward at times; computer and phone work - x 25 yrs    Leisure cooking; sedentary      Sensation   Additional Comments denies any numbness or tingling      Posture/Postural Control   Posture Comments head forward; shoulders rounded; head of the  humerus anterior in orientation; rounded lumbar spine      AROM   Cervical Flexion 38    Cervical Extension 29    Cervical - Right Side Bend 18    Cervical - Left Side Bend 21    Cervical - Right Rotation 55    Cervical - Left Rotation 54      Strength   Overall Strength Comments WFL's - moves UE's well against gravity    Right Hand Grip (lbs) 45    Left Hand Grip (lbs) 60      Palpation   Spinal mobility hypomobile thoracic spine with PA mobs    Palpation comment significant muscular tightness through anterior/laterial/posterior cervical musculature; upper traps; leveator; pecs Rt > Lt                        Objective measurements completed on examination: See above findings.       [facility] PT Treatment/Exercise - 11/07/20 0001       Neuro Re-ed    Neuro Re-ed Details  working on posture and alignment - chest up; shoulders down and back; engaging posterior shoulder girdle      Shoulder Exercises: Standing   Other Standing Exercises axial extension 10 sec x 5; scap squeeze 10 sec x 5; L's x 10; W's x 10 with foam roll along spine    Other Standing Exercises posterior shoulder rolls                    PT Education - 11/07/20 1559     Education Details POC HEP posture and alignment    Person(s) Educated Patient    Methods Explanation;Demonstration;Tactile cues;Verbal cues;Handout    Comprehension Verbalized understanding;Returned demonstration;Verbal cues required;Tactile cues required                 PT Long Term Goals - 11/07/20 1610       PT LONG TERM GOAL #1   Title Improve posture and alignment with patient to demonstrate improved upright posture with posterior shoudler girdle engaged    Baseline -    Time 6    Period Weeks    Status New    Target Date 12/19/20      PT LONG TERM GOAL #2   Title Patient to report decrease of cervical pain with ROM by 75-100% allowing him to look up functionally with minimal  to no pain     Time 6    Period Weeks    Status New    Target Date 12/19/20      PT LONG TERM GOAL #3   Title Increase Rt grip strength to 55-60# (> 90% of grip strength Lt hand)    Baseline -    Time 6    Period Weeks    Status New    Target Date 12/19/20      PT LONG TERM GOAL #4   Title Independent in HEP    Baseline -    Time 6    Status New    Target Date 12/19/20                    Plan - 11/07/20 1605     Clinical Impression Statement Patient presents s/p ACDF of C4/5/6 on 09/08/20. He has poor posture and alignment; limited cervical and thoracic spinal mobility; decreased cervical ROM/mobility; pain with end ranges cervical mobility; UE weakness demonstrated by decreased grip strength Rt; limited functional activity level. He will benefit form PT to address problems identified.    Rehab Potential Good    PT Frequency 2x / week    PT Duration 6 weeks    PT Treatment/Interventions ADLs/Self Care Home Management;Aquatic Therapy;Cryotherapy;Electrical Stimulation;Iontophoresis '4mg'$ /ml Dexamethasone;Moist Heat;Ultrasound;Functional mobility training;Therapeutic activities;Therapeutic exercise;Neuromuscular re-education;Manual techniques;Passive range of motion;Dry needling;Taping    PT Next Visit Plan review HEP; progress with pec stretches; postural correction; posterior shoulder girdle strengthening; manual work cervical and thoracic musculature; modalities as indicated    PT Home Exercise Plan [phone]    Consulted and Agree with Plan of Care Patient             Patient will benefit from skilled therapeutic intervention in order to improve the following deficits and impairments:  Decreased range of motion, Increased fascial restricitons, Decreased activity tolerance, Pain, Hypomobility, Impaired flexibility, Improper body mechanics, Decreased mobility, Decreased strength, Postural dysfunction  Visit Diagnosis: Cervicalgia  Abnormal posture  Muscle weakness  (generalized)  Other symptoms and signs involving the musculoskeletal system     Problem List Patient Active Problem List   Diagnosis Date Noted   Iron deficiency anemia 01/06/2020   Midline low back pain without sciatica 07/09/2019   Prediabetes 01/09/2019   Migraine 09/09/2018   H. pylori infection 05/03/2016   Dyspepsia 05/02/2016   Palpitations 11/28/2015   Panic attacks 06/14/2015   Nicotine vapor product user 10/11/2014   Essential hypertension 06/30/2014   Hypogonadism in male 12/15/2013   History of suicide attempt 12/10/2013   Degenerative disc disease, cervical 03/09/2013   History of thrombocytosis 03/09/2013   Erectile dysfunction 12/24/2012    Elisa Sorlie Nilda Simmer PT, MPH  11/07/2020, 4:15 PM  [facility] [place], [place], [address] Phone: [phone]   Fax:  [phone]  Name: Merit Herpel MRN: [id number] Date of Birth: Aug 13, 1975

## 2020-11-07 NOTE — Patient Instructions (Signed)
Access Code: [email or web address] URL: [email or web address] Date: 11/07/2020 Prepared by: Gillermo Murdoch  Exercises Seated Cervical Retraction - 3 x daily - 7 x weekly - 10 reps - 1 sets Standing Scapular Retraction - 3 x daily - 7 x weekly - 10 reps - 1 sets - 10 hold Shoulder External Rotation and Scapular Retraction - 3 x daily - 7 x weekly - 10 reps - 1 sets - hold Shoulder External Rotation in 45 Degrees Abduction - 2 x daily - 7 x weekly - 1-2 sets - 10 reps - 3 sec hold Standing Backward Shoulder Rolls - 2 x daily - 7 x weekly - 1 sets - 10 reps - 1-2 sec hold

## 2020-11-10 ENCOUNTER — Encounter: Payer: BC Managed Care – PPO | Admitting: Rehabilitative and Restorative Service Providers"

## 2020-11-14 ENCOUNTER — Encounter: Payer: Self-pay | Admitting: Family Medicine

## 2020-11-14 DIAGNOSIS — E291 Testicular hypofunction: Secondary | ICD-10-CM

## 2020-11-15 MED ORDER — TESTOSTERONE CYPIONATE 200 MG/ML IM SOLN: 300.0000 mg | INTRAMUSCULAR | 3 refills | Status: DC

## 2020-11-16 ENCOUNTER — Other Ambulatory Visit: Payer: Self-pay | Admitting: Family Medicine

## 2020-11-16 ENCOUNTER — Other Ambulatory Visit: Payer: Self-pay

## 2020-11-16 ENCOUNTER — Encounter: Payer: Self-pay | Admitting: Rehabilitative and Restorative Service Providers"

## 2020-11-16 ENCOUNTER — Ambulatory Visit: Payer: BC Managed Care – PPO | Admitting: Rehabilitative and Restorative Service Providers"

## 2020-11-16 DIAGNOSIS — R29898 Other symptoms and signs involving the musculoskeletal system: Secondary | ICD-10-CM | POA: Diagnosis not present

## 2020-11-16 DIAGNOSIS — M542 Cervicalgia: Secondary | ICD-10-CM | POA: Diagnosis not present

## 2020-11-16 DIAGNOSIS — G8929 Other chronic pain: Secondary | ICD-10-CM

## 2020-11-16 DIAGNOSIS — E291 Testicular hypofunction: Secondary | ICD-10-CM

## 2020-11-16 DIAGNOSIS — R293 Abnormal posture: Secondary | ICD-10-CM | POA: Diagnosis not present

## 2020-11-16 DIAGNOSIS — M6281 Muscle weakness (generalized): Secondary | ICD-10-CM | POA: Diagnosis not present

## 2020-11-16 DIAGNOSIS — M25511 Pain in right shoulder: Secondary | ICD-10-CM

## 2020-11-16 NOTE — Patient Instructions (Signed)
Access Code: [email or web address] URL: [email or web address] Date: 11/16/2020 Prepared by: Gillermo Murdoch  Exercises Seated Cervical Retraction - 3 x daily - 7 x weekly - 10 reps - 1 sets Standing Scapular Retraction - 3 x daily - 7 x weekly - 10 reps - 1 sets - 10 hold Shoulder External Rotation and Scapular Retraction - 3 x daily - 7 x weekly - 10 reps - 1 sets - hold Shoulder External Rotation in 45 Degrees Abduction - 2 x daily - 7 x weekly - 1-2 sets - 10 reps - 3 sec hold Standing Backward Shoulder Rolls - 2 x daily - 7 x weekly - 1 sets - 10 reps - 1-2 sec hold Doorway Pec Stretch at 60 Degrees Abduction - 3 x daily - 7 x weekly - 3 reps - 1 sets Doorway Pec Stretch at 90 Degrees Abduction - 3 x daily - 7 x weekly - 3 reps - 1 sets - 30 seconds hold Doorway Pec Stretch at 120 Degrees Abduction - 3 x daily - 7 x weekly - 3 reps - 1 sets - 30 second hold hold Shoulder External Rotation and Scapular Retraction with Resistance - 2 x daily - 7 x weekly - 1 sets - 10 reps - 3-5 sec hold

## 2020-11-16 NOTE — Therapy (Signed)
[facility] [place], [place], [address] Phone: [phone]   Fax:  [phone]  Physical Therapy Treatment  Patient Details  Name: Justin Ashley MRN: [id number] Date of Birth: 1975-08-12 Referring Provider (PT): Dr Phylliss Bob   Encounter Date: 11/16/2020   PT End of Session - 11/16/20 1522     Visit Number 2    Number of Visits 12    Date for PT Re-Evaluation 12/19/20    PT Start Time 1520    PT Stop Time 1605    PT Time Calculation (min) 45 min    Activity Tolerance Patient tolerated treatment well             Past Medical History:  Diagnosis Date   Dislocation of metatarsophalangeal joint of right great toe    Erectile dysfunction 12/24/2012   BCBS will not cover Cialis    Essential hypertension 06/30/2014   GERD (gastroesophageal reflux disease)    Headache    History of kidney stones    History of thrombocytosis 03/09/2013   Hypogonadism in male 12/15/2013   Recheck therapy January 2016    Kidney stone    Pneumonia age 81   Pre-diabetes    Sleep apnea    self report   Suicide attempt ([place]) 12/10/2013    Past Surgical History:  Procedure Laterality Date   ANTERIOR CERVICAL DECOMP/DISCECTOMY FUSION N/A 09/08/2020   Procedure: ANTERIOR CERVICAL DECOMPRESSION FUSION CERVICAL 4- CERVICAL5 , CERVICAL 5 - CERVICAL 6 WITH INSTRUMENTATION AND ALLOGRAFT;  Surgeon: Phylliss Bob, MD;  Location: [facility];  Service: Orthopedics;  Laterality: N/A;   DENTAL SURGERY Left 2018   Has metal maybe titanium   NO PAST SURGERIES      There were no vitals filed for this visit.   Subjective Assessment - 11/16/20 1524     Subjective Patient reports that he has been working on exercises some since last visit. 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    Currently in Pain? No/denies    Pain Score 0-No pain    Pain Location Neck                [facility] PT Assessment - 11/16/20 0001       Assessment    Medical Diagnosis Cervial dysfunction    Referring Provider (PT) Dr Phylliss Bob    Onset Date/Surgical Date 09/08/20   pain x 7 yrs worse in the past year   Hand Dominance Left    Next MD Visit PRN    Prior Therapy here for 3 visits prior to surgery      Palpation   Palpation comment persistent muscular tightness through anterior/laterial/posterior cervical musculature; upper traps; leveator; pecs Rt > Lt                           [facility] Adult PT Treatment/Exercise - 11/16/20 0001       Self-Care   Other Self-Care Comments  demonstrated and practices holding cell phone with decresed forwardhead posture      Therapeutic Activites    Therapeutic Activities Other Therapeutic Activities    Other Therapeutic Activities occipital release 1-2 min with gold balls      Neuro Re-ed    Neuro Re-ed Details  working on posture and alignment - chest up; shoulders down and back; engaging posterior shoulder girdle      Shoulder Exercises: Supine   Other Supine Exercises nodding yes  and no x 10-15 reps      Shoulder Exercises: Standing   External Rotation Strengthening;Both;10 reps;Theraband   3 sec hold   Theraband Level (Shoulder External Rotation) Level 1 (Yellow)    Other Standing Exercises axial extension 10 sec x 5; scap squeeze 10 sec x 5; L's x 10; W's x 10 with foam roll along spine    Other Standing Exercises posterior shoulder rolls      Shoulder Exercises: Stretch   Other Shoulder Stretches doorway stretch 3 positions 30 sec x 2 reps each                  Upper Extremity Functional Index Score :   /80   PT Education - 11/16/20 1534     Education Details HEP    Person(s) Educated Patient    Methods Explanation;Demonstration;Tactile cues;Verbal cues;Handout    Comprehension Verbalized understanding;Returned demonstration;Verbal cues required;Tactile cues required                 PT Long Term Goals - 11/07/20 1610       PT LONG TERM  GOAL #1   Title Improve posture and alignment with patient to demonstrate improved upright posture with posterior shoudler girdle engaged    Baseline -    Time 6    Period Weeks    Status New    Target Date 12/19/20      PT LONG TERM GOAL #2   Title Patient to report decrease of cervical pain with ROM by 75-100% allowing him to look up functionally with minimal to no pain    Time 6    Period Weeks    Status New    Target Date 12/19/20      PT LONG TERM GOAL #3   Title Increase Rt grip strength to 55-60# (> 90% of grip strength Lt hand)    Baseline -    Time 6    Period Weeks    Status New    Target Date 12/19/20      PT LONG TERM GOAL #4   Title Independent in HEP    Baseline -    Time 6    Status New    Target Date 12/19/20                   Plan - 11/16/20 1529     Clinical Impression Statement Patient has done some of the exercises but has been busy some days and unable to do exercises. He bought a "posture brace" which he has used some as well. Discussed use to external bracing vs neuromuscular correction available online. Reviewed exercises and progressed with pec stretches and started posterior shoulder girdle strengthening. Offered alternative way to hold cell phone.    Rehab Potential Good    PT Frequency 2x / week    PT Duration 6 weeks    PT Treatment/Interventions ADLs/Self Care Home Management;Aquatic Therapy;Cryotherapy;Electrical Stimulation;Iontophoresis '4mg'$ /ml Dexamethasone;Moist Heat;Ultrasound;Functional mobility training;Therapeutic activities;Therapeutic exercise;Neuromuscular re-education;Manual techniques;Passive range of motion;Dry needling;Taping    PT Next Visit Plan review HEP; progress with postural correction; posterior shoulder girdle strengthening; manual work cervical and thoracic musculature; modalities as indicated    PT Home Exercise Plan [phone]    Consulted and Agree with Plan of Care Patient             Patient will  benefit from skilled therapeutic intervention in order to improve the following deficits and impairments:     Visit Diagnosis: Cervicalgia  Abnormal posture  Muscle weakness (generalized)  Other symptoms and signs involving the musculoskeletal system  Chronic right shoulder pain     Problem List Patient Active Problem List   Diagnosis Date Noted   Iron deficiency anemia 01/06/2020   Midline low back pain without sciatica 07/09/2019   Prediabetes 01/09/2019   Migraine 09/09/2018   H. pylori infection 05/03/2016   Dyspepsia 05/02/2016   Palpitations 11/28/2015   Panic attacks 06/14/2015   Nicotine vapor product user 10/11/2014   Essential hypertension 06/30/2014   Hypogonadism in male 12/15/2013   History of suicide attempt 12/10/2013   Degenerative disc disease, cervical 03/09/2013   History of thrombocytosis 03/09/2013   Erectile dysfunction 12/24/2012    Selinda Korzeniewski Nilda Simmer, PT MPH 11/16/2020, 3:59 PM  [facility] [place], [place], [address] Phone: [phone]   Fax:  [phone]  Name: Justin Ashley MRN: [id number] Date of Birth: 01/10/1976

## 2020-11-18 ENCOUNTER — Encounter: Payer: BC Managed Care – PPO | Admitting: Physical Therapy

## 2020-11-18 ENCOUNTER — Encounter: Payer: Self-pay | Admitting: Rehabilitative and Restorative Service Providers"

## 2020-11-21 ENCOUNTER — Other Ambulatory Visit: Payer: Self-pay | Admitting: Family Medicine

## 2020-11-21 DIAGNOSIS — E291 Testicular hypofunction: Secondary | ICD-10-CM

## 2020-11-21 MED ORDER — TESTOSTERONE CYPIONATE 200 MG/ML IM SOLN: 300.0000 mg | INTRAMUSCULAR | 2 refills | Status: DC

## 2020-11-25 ENCOUNTER — Ambulatory Visit: Payer: BC Managed Care – PPO | Admitting: Physical Therapy

## 2020-11-25 ENCOUNTER — Other Ambulatory Visit: Payer: Self-pay

## 2020-11-25 DIAGNOSIS — R29898 Other symptoms and signs involving the musculoskeletal system: Secondary | ICD-10-CM

## 2020-11-25 DIAGNOSIS — R293 Abnormal posture: Secondary | ICD-10-CM

## 2020-11-25 DIAGNOSIS — M6281 Muscle weakness (generalized): Secondary | ICD-10-CM

## 2020-11-25 DIAGNOSIS — M542 Cervicalgia: Secondary | ICD-10-CM | POA: Diagnosis not present

## 2020-11-25 NOTE — Therapy (Signed)
[place]  [place], [place], [address] Phone: [phone]   Fax:  [phone]  Physical Therapy Treatment  Patient Details  Name: Justin Ashley MRN: [id number] Date of Birth: 1975/05/06 Referring Provider (PT): Dr Phylliss Bob   Encounter Date: 11/25/2020   PT End of Session - 11/25/20 1614     Visit Number 3    Number of Visits 12    Date for PT Re-Evaluation 12/19/20    PT Start Time [id number]    PT Stop Time [id number]    PT Time Calculation (min) 44 min    Activity Tolerance Patient tolerated treatment well    Behavior During Therapy [facility] for tasks assessed/performed             Past Medical History:  Diagnosis Date   Dislocation of metatarsophalangeal joint of right great toe    Erectile dysfunction 12/24/2012   BCBS will not cover Cialis    Essential hypertension 06/30/2014   GERD (gastroesophageal reflux disease)    Headache    History of kidney stones    History of thrombocytosis 03/09/2013   Hypogonadism in male 12/15/2013   Recheck therapy January 2016    Kidney stone    Pneumonia age 49   Pre-diabetes    Sleep apnea    self report   Suicide attempt ([place]) 12/10/2013    Past Surgical History:  Procedure Laterality Date   ANTERIOR CERVICAL DECOMP/DISCECTOMY FUSION N/A 09/08/2020   Procedure: ANTERIOR CERVICAL DECOMPRESSION FUSION CERVICAL 4- CERVICAL5 , CERVICAL 5 - CERVICAL 6 WITH INSTRUMENTATION AND ALLOGRAFT;  Surgeon: Phylliss Bob, MD;  Location: [facility];  Service: Orthopedics;  Laterality: N/A;   DENTAL SURGERY Left 2018   Has metal maybe titanium   NO PAST SURGERIES      There were no vitals filed for this visit.   Subjective Assessment - 11/25/20 1537     Subjective Pt states his garage door broke yesterday and he had to lift it so he has increased pain on Rt side today    Patient Stated Goals wants to be able to look up and increase mobility as well as get rid of the pain in the  neck area    Currently in Pain? Yes    Pain Score 2     Pain Location Neck    Pain Orientation Right    Pain Descriptors / Indicators Sore                [facility] PT Assessment - 11/25/20 0001       Assessment   Medical Diagnosis Cervial dysfunction    Referring Provider (PT) Dr Phylliss Bob    Onset Date/Surgical Date 09/08/20   pain x 7 yrs worse in the past year   Hand Dominance Left    Next MD Visit PRN      AROM   Cervical - Right Side Bend 25    Cervical - Left Side Bend 24                           [facility] Adult PT Treatment/Exercise - 11/25/20 0001       Shoulder Exercises: Standing   External Rotation Strengthening;Both;10 reps;Theraband   3 sec hold   Theraband Level (Shoulder External Rotation) Level 1 (Yellow)    Diagonals Right;Left;10 reps    Theraband Level (Shoulder Diagonals) Level 1 (Yellow)    Other Standing Exercises axial extension 10 sec  x 5; scap squeeze 10 sec x 5; L's x 10; W's x 10 with foam roll along spine    Other Standing Exercises posterior shoulder rolls      Shoulder Exercises: ROM/Strengthening   UBE (Upper Arm Bike) L3 x 4 min alt fwd/bkwd      Shoulder Exercises: Stretch   Other Shoulder Stretches doorway stretch 3 positions 30 sec x 2 reps each      Manual Therapy   Manual Therapy Soft tissue mobilization;Passive ROM    Soft tissue mobilization bilat cervical paraspinals, upper traps, levator    Passive ROM cervical sidebending and rotation bilat to tolerance                          PT Long Term Goals - 11/07/20 1610       PT LONG TERM GOAL #1   Title Improve posture and alignment with patient to demonstrate improved upright posture with posterior shoudler girdle engaged    Baseline -    Time 6    Period Weeks    Status New    Target Date 12/19/20      PT LONG TERM GOAL #2   Title Patient to report decrease of cervical pain with ROM by 75-100% allowing him to look up functionally with  minimal to no pain    Time 6    Period Weeks    Status New    Target Date 12/19/20      PT LONG TERM GOAL #3   Title Increase Rt grip strength to 55-60# (> 90% of grip strength Lt hand)    Baseline -    Time 6    Period Weeks    Status New    Target Date 12/19/20      PT LONG TERM GOAL #4   Title Independent in HEP    Baseline -    Time 6    Status New    Target Date 12/19/20                   Plan - 11/25/20 1614     Clinical Impression Statement Pt responds well to passive stretching and ROM, continues to require cuing for posture during exercises    PT Next Visit Plan progress HEP; progress with postural correction; posterior shoulder girdle strengthening; manual work cervical and thoracic musculature; modalities as indicated    PT Home Exercise Plan [phone]    Consulted and Agree with Plan of Care Patient             Patient will benefit from skilled therapeutic intervention in order to improve the following deficits and impairments:     Visit Diagnosis: Cervicalgia  Abnormal posture  Muscle weakness (generalized)  Other symptoms and signs involving the musculoskeletal system     Problem List Patient Active Problem List   Diagnosis Date Noted   Iron deficiency anemia 01/06/2020   Midline low back pain without sciatica 07/09/2019   Prediabetes 01/09/2019   Migraine 09/09/2018   H. pylori infection 05/03/2016   Dyspepsia 05/02/2016   Palpitations 11/28/2015   Panic attacks 06/14/2015   Nicotine vapor product user 10/11/2014   Essential hypertension 06/30/2014   Hypogonadism in male 12/15/2013   History of suicide attempt 12/10/2013   Degenerative disc disease, cervical 03/09/2013   History of thrombocytosis 03/09/2013   Erectile dysfunction 12/24/2012    Jaidy Cottam, PT 11/25/2020, 4:17 PM  [facility] [address]  [place], [place], [address] Phone: [phone]   Fax:   [phone]  Name: Justin Ashley MRN: [id number] Date of Birth: 1975/08/15

## 2020-12-02 DIAGNOSIS — Z9889 Other specified postprocedural states: Secondary | ICD-10-CM | POA: Diagnosis not present

## 2020-12-08 ENCOUNTER — Ambulatory Visit (INDEPENDENT_AMBULATORY_CARE_PROVIDER_SITE_OTHER): Payer: BC Managed Care – PPO | Admitting: Physical Therapy

## 2020-12-08 ENCOUNTER — Other Ambulatory Visit: Payer: Self-pay

## 2020-12-08 DIAGNOSIS — R29898 Other symptoms and signs involving the musculoskeletal system: Secondary | ICD-10-CM

## 2020-12-08 DIAGNOSIS — M542 Cervicalgia: Secondary | ICD-10-CM | POA: Diagnosis not present

## 2020-12-08 DIAGNOSIS — R293 Abnormal posture: Secondary | ICD-10-CM

## 2020-12-08 DIAGNOSIS — M6281 Muscle weakness (generalized): Secondary | ICD-10-CM | POA: Diagnosis not present

## 2020-12-08 NOTE — Therapy (Signed)
[place], [place], [address] Phone: [phone]   Fax:  [phone]  Physical Therapy Treatment  Patient Details  Name: Justin Ashley MRN: [id number] Date of Birth: 10/05/1975 Referring Provider (PT): Dr Phylliss Bob   Encounter Date: 12/08/2020   PT End of Session - 12/08/20 1451     Visit Number 4    Number of Visits 12    Date for PT Re-Evaluation 12/19/20    PT Start Time [id number]    PT Stop Time 1529    PT Time Calculation (min) 38 min    Activity Tolerance Patient tolerated treatment well    Behavior During Therapy [facility] for tasks assessed/performed             Past Medical History:  Diagnosis Date   Dislocation of metatarsophalangeal joint of right great toe    Erectile dysfunction 12/24/2012   BCBS will not cover Cialis    Essential hypertension 06/30/2014   GERD (gastroesophageal reflux disease)    Headache    History of kidney stones    History of thrombocytosis 03/09/2013   Hypogonadism in male 12/15/2013   Recheck therapy January 2016    Kidney stone    Pneumonia age 46   Pre-diabetes    Sleep apnea    self report   Suicide attempt ([place]) 12/10/2013    Past Surgical History:  Procedure Laterality Date   ANTERIOR CERVICAL DECOMP/DISCECTOMY FUSION N/A 09/08/2020   Procedure: ANTERIOR CERVICAL DECOMPRESSION FUSION CERVICAL 4- CERVICAL5 , CERVICAL 5 - CERVICAL 6 WITH INSTRUMENTATION AND ALLOGRAFT;  Surgeon: Phylliss Bob, MD;  Location: [place];  Service: Orthopedics;  Laterality: N/A;   DENTAL SURGERY Left 2018   Has metal maybe titanium   NO PAST SURGERIES      There were no vitals filed for this visit.   Subjective Assessment - 12/08/20 1503     Subjective Pt reports that he did not get the posture corrector; no longer sold on [employer]. He does the neck retraction exercise regularly.  He started running this week, 2.5 mile - went ok, but bothered lower back.    Pertinent  History history of cervical dysfunction for years; HTN; LBP    Patient Stated Goals wants to be able to look up and increase mobility as well as get rid of the pain in the neck area    Currently in Pain? No/denies    Pain Score --   just stiffness               [facility] PT Assessment - 12/08/20 0001       Assessment   Medical Diagnosis Cervial dysfunction    Referring Provider (PT) Dr Phylliss Bob    Onset Date/Surgical Date 09/08/20   pain x 7 yrs worse in the past year   Hand Dominance Left    Next MD Visit PRN      Strength   Right Hand Grip (lbs) 59    Left Hand Grip (lbs) 60               [facility] Adult PT Treatment/Exercise - 12/08/20 0001       Shoulder Exercises: Supine   Horizontal ABduction Strengthening;Both;10 reps   3 sec hold   Theraband Level (Shoulder Horizontal ABduction) Level 3 (Green)    External Rotation Strengthening;Both;10 reps   2 sets, one with red, one with green   Flexion Strengthening;Both;10 reps    Theraband Level (Shoulder Flexion) Level  3 (Green) -overhead reach.   Diagonals Strengthening;Right;Left;10 reps    Theraband Level (Shoulder Diagonals) Level 3 (Green)    Other Supine Exercises chin tucks x 5 sec x 10, cervical rotation with head nods x 5 reps (3 nods each side)    Other Supine Exercises supine hamstring stretch with strap, knee to chest x 15 sec - 2 sets.  Arms in T with LTR for pec stretch x5-10sec x 5 reps, with head rotating opp direciton     Manual Therapy   Soft tissue mobilization bilat cervical paraspinals, upper traps, levator.  suboccipital release.    Passive ROM cervical sidebending and rotation bilat to tolerance                     PT Education - 12/08/20 1641     Education Details HEP - issued red/green band.  updated HEP (exercise issued in standing, but told can do it in supine)    Person(s) Educated Patient    Methods Explanation;Demonstration;Handout;Tactile cues    Comprehension Verbalized  understanding;Returned demonstration                 PT Long Term Goals - 12/08/20 1456       PT LONG TERM GOAL #1   Title Improve posture and alignment with patient to demonstrate improved upright posture with posterior shoudler girdle engaged    Baseline -    Time 6    Period Weeks    Status On-going      PT LONG TERM GOAL #2   Title Patient to report decrease of cervical pain with ROM by 75-100% allowing him to look up functionally with minimal to no pain    Baseline 50% improvement : 12/08/20    Time 6    Period Weeks    Status On-going      PT LONG TERM GOAL #3   Title Increase Rt grip strength to 55-60# (> 90% of grip strength Lt hand)    Baseline 59% : 12/08/20    Time 6    Period Weeks    Status Achieved      PT LONG TERM GOAL #4   Title Independent in HEP    Baseline -    Time 6    Status On-going                   Plan - 12/08/20 1459     Clinical Impression Statement Pt demonstrated improved Rt grip strength; has met strength goal. He tolerated supine exercises well, reporting decreased stiffness at the end of treatment. Introduced LE stretches to add to running program to assist with return to sport with less back/neck pain.  Progressing well towards goals.    Rehab Potential Good    PT Frequency 2x / week    PT Duration 6 weeks    PT Treatment/Interventions ADLs/Self Care Home Management;Aquatic Therapy;Cryotherapy;Electrical Stimulation;Iontophoresis 73m/ml Dexamethasone;Moist Heat;Ultrasound;Functional mobility training;Therapeutic activities;Therapeutic exercise;Neuromuscular re-education;Manual techniques;Passive range of motion;Dry needling;Taping    PT Next Visit Plan progress HEP; progress with postural correction; posterior shoulder girdle strengthening; manual work cervical and thoracic musculature; modalities as indicated    PT Home Exercise Plan [phone]            Patient will benefit from skilled therapeutic intervention in  order to improve the following deficits and impairments:  Decreased range of motion, Increased fascial restricitons, Decreased activity tolerance, Pain, Hypomobility, Impaired flexibility, Improper body mechanics, Decreased mobility, Decreased strength, Postural  dysfunction  Visit Diagnosis: Cervicalgia  Abnormal posture  Muscle weakness (generalized)  Other symptoms and signs involving the musculoskeletal system     Problem List Patient Active Problem List   Diagnosis Date Noted   Iron deficiency anemia 01/06/2020   Midline low back pain without sciatica 07/09/2019   Prediabetes 01/09/2019   Migraine 09/09/2018   H. pylori infection 05/03/2016   Dyspepsia 05/02/2016   Palpitations 11/28/2015   Panic attacks 06/14/2015   Nicotine vapor product user 10/11/2014   Essential hypertension 06/30/2014   Hypogonadism in male 12/15/2013   History of suicide attempt 12/10/2013   Degenerative disc disease, cervical 03/09/2013   History of thrombocytosis 03/09/2013   Erectile dysfunction 12/24/2012   Kerin Perna, PTA 12/08/20 4:42 PM  [place], [place], [address] Phone: [phone]   Fax:  [phone]  Name: Justin Ashley MRN: [id number] Date of Birth: October 24, 1975

## 2020-12-08 NOTE — Patient Instructions (Signed)
Access Code: [email or web address] URL: [email or web address] Date: 12/08/2020 Prepared by: [employer]  Exercises Seated Cervical Retraction - 3 x daily - 7 x weekly - 10 reps - 1 sets Standing Scapular Retraction - 3 x daily - 7 x weekly - 10 reps - 1 sets - 10 hold Shoulder External Rotation and Scapular Retraction - 3 x daily - 7 x weekly - 10 reps - 1 sets - hold Shoulder External Rotation in 45 Degrees Abduction - 2 x daily - 7 x weekly - 1-2 sets - 10 reps - 3 sec hold Standing Backward Shoulder Rolls - 2 x daily - 7 x weekly - 1 sets - 10 reps - 1-2 sec hold Doorway Pec Stretch at 60 Degrees Abduction - 3 x daily - 7 x weekly - 3 reps - 1 sets Doorway Pec Stretch at 90 Degrees Abduction - 3 x daily - 7 x weekly - 3 reps - 1 sets - 30 seconds hold Doorway Pec Stretch at 120 Degrees Abduction - 3 x daily - 7 x weekly - 3 reps - 1 sets - 30 second hold hold Shoulder External Rotation and Scapular Retraction with Resistance - 2 x daily - 7 x weekly - 1 sets - 10 reps - 3-5 sec hold Standing Shoulder Horizontal and Diagonal Pulls with Resistance - 1 x daily - 7 x weekly - 1 sets - 10 reps

## 2020-12-15 ENCOUNTER — Ambulatory Visit: Payer: BC Managed Care – PPO | Admitting: Physical Therapy

## 2020-12-15 ENCOUNTER — Other Ambulatory Visit: Payer: Self-pay

## 2020-12-15 ENCOUNTER — Encounter: Payer: Self-pay | Admitting: Physical Therapy

## 2020-12-15 DIAGNOSIS — M542 Cervicalgia: Secondary | ICD-10-CM | POA: Diagnosis not present

## 2020-12-15 DIAGNOSIS — M6281 Muscle weakness (generalized): Secondary | ICD-10-CM

## 2020-12-15 DIAGNOSIS — R29898 Other symptoms and signs involving the musculoskeletal system: Secondary | ICD-10-CM | POA: Diagnosis not present

## 2020-12-15 DIAGNOSIS — R293 Abnormal posture: Secondary | ICD-10-CM

## 2020-12-15 NOTE — Therapy (Addendum)
[place], [place], [address] Phone: [phone]   Fax:  [phone]  Physical Therapy Treatment and Discharge Summary  PHYSICAL THERAPY DISCHARGE SUMMARY  Visits from Start of Care: 5  Current functional level related to goals / functional outcomes: See progress note for discharge status    Remaining deficits: Unknown   Education / Equipment: HEP    Patient agrees to discharge. Patient goals were partially met. Patient is being discharged due to not returning since the last visit.  Justin Ashley PT, MPH 03/28/21 12:17 PM   Patient Details  Name: Justin Ashley MRN: [id number] Date of Birth: Jan 13, 1976 Referring Provider (PT): Dr Phylliss Bob   Encounter Date: 12/15/2020   PT End of Session - 12/15/20 1449     Visit Number 5    Number of Visits 12    Date for PT Re-Evaluation 12/19/20    PT Start Time 9179    PT Stop Time 1527    PT Time Calculation (min) 38 min    Activity Tolerance Patient tolerated treatment well    Behavior During Therapy [facility] for tasks assessed/performed             Past Medical History:  Diagnosis Date   Dislocation of metatarsophalangeal joint of right great toe    Erectile dysfunction 12/24/2012   BCBS will not cover Cialis    Essential hypertension 06/30/2014   GERD (gastroesophageal reflux disease)    Headache    History of kidney stones    History of thrombocytosis 03/09/2013   Hypogonadism in male 12/15/2013   Recheck therapy January 2016    Kidney stone    Pneumonia age 80   Pre-diabetes    Sleep apnea    self report   Suicide attempt ([place]) 12/10/2013    Past Surgical History:  Procedure Laterality Date   ANTERIOR CERVICAL DECOMP/DISCECTOMY FUSION N/A 09/08/2020   Procedure: ANTERIOR CERVICAL DECOMPRESSION FUSION CERVICAL 4- CERVICAL5 , CERVICAL 5 - CERVICAL 6 WITH INSTRUMENTATION AND ALLOGRAFT;  Surgeon: Phylliss Bob, MD;  Location: [place];  Service: Orthopedics;  Laterality: N/A;   DENTAL SURGERY Left 2018   Has metal maybe titanium   NO PAST SURGERIES      There were no vitals filed for this visit.   Subjective Assessment - 12/15/20 1452     Subjective "I feel like I'm getting better and better".  He ran 2.5 miles and had some soreness afterwards, but it has resolved. He does his exercises every other day.    Pertinent History history of cervical dysfunction for years; HTN; LBP    Patient Stated Goals wants to be able to look up and increase mobility as well as get rid of the pain in the neck area    Currently in Pain? No/denies    Pain Score 0-No pain                [facility] PT Assessment - 12/15/20 0001       Assessment   Medical Diagnosis Cervial dysfunction    Referring Provider (PT) Dr Phylliss Bob    Onset Date/Surgical Date 09/08/20   pain x 7 yrs worse in the past year   Hand Dominance Left    Next MD Visit PRN      AROM   Cervical Flexion 50    Cervical Extension 47    Cervical - Right Side Bend 40    Cervical - Left Side Bend 40  Cervical - Right Rotation 53    Cervical - Left Rotation 62              [facility] Adult PT Treatment/Exercise - 12/15/20 0001       Neck Exercises: Prone   Other Prone Exercise cervical flexion to/from neutral x 3 reps, then diagonals for ROM; repeated in sitting      Shoulder Exercises: Supine   Horizontal ABduction Strengthening;Both;10 reps   3 sec hold   Theraband Level (Shoulder Horizontal ABduction) Level 3 (Green)    External Rotation Strengthening;Both;10 reps    Theraband Level (Shoulder External Rotation) Level 3 (Green)    Flexion Strengthening;Both;10 reps    Theraband Level (Shoulder Flexion) Level 3 (Green)    Diagonals Strengthening;Right;Left;10 reps    Theraband Level (Shoulder Diagonals) Level 3 (Green)    Other Supine Exercises chin tucks x 5 sec x 10, cervical rotation with head nods x 5 reps (3 nods each side)    Other Supine  Exercises supine hamstring stretch with strap, piriformis stretch x 15 sec - 2 sets.  Arms in T with LTR for pec stretch x 15 sec x 2 reps      Shoulder Exercises: ROM/Strengthening   UBE (Upper Arm Bike) L4: 3 min alternating directions in standing      Shoulder Exercises: Stretch   Other Shoulder Stretches mid doorway stretch x 20 sec x 2, bilat and unilateral high doorway stretch x 20 sec x 2               PT Long Term Goals - 12/15/20 1458       PT LONG TERM GOAL #1   Title Improve posture and alignment with patient to demonstrate improved upright posture with posterior shoudler girdle engaged    Baseline -    Time 6    Period Weeks    Status Achieved      PT LONG TERM GOAL #2   Title Patient to report decrease of cervical pain with ROM by 75-100% allowing him to look up functionally with minimal to no pain    Baseline 75% improvement.    Time 6    Period Weeks    Status Achieved      PT LONG TERM GOAL #3   Title Increase Rt grip strength to 55-60# (> 90% of grip strength Lt hand)    Baseline 59% : 12/08/20    Time 6    Period Weeks    Status Achieved      PT LONG TERM GOAL #4   Title Independent in HEP    Baseline -    Time 6    Status Achieved                   Plan - 12/15/20 1501     Clinical Impression Statement Pt reporting improved pain with cervical ROM; has met this goal.  Pt reporting improved compliance with HEP exercises.  Pt's cervical ROM improved.  Pt has met all goals and verbalized desire to hold therapy while he continues to work on [employer].    Rehab Potential Good    PT Frequency 2x / week    PT Duration 6 weeks    PT Treatment/Interventions ADLs/Self Care Home Management;Aquatic Therapy;Cryotherapy;Electrical Stimulation;Iontophoresis 64m/ml Dexamethasone;Moist Heat;Ultrasound;Functional mobility training;Therapeutic activities;Therapeutic exercise;Neuromuscular re-education;Manual techniques;Passive range of motion;Dry needling;Taping     PT Next Visit Plan hold PT until 01/05/21; if pt doesn't return prior to this date - will d/c.  PT Home Exercise Plan [phone]             Patient will benefit from skilled therapeutic intervention in order to improve the following deficits and impairments:  Decreased range of motion, Increased fascial restricitons, Decreased activity tolerance, Pain, Hypomobility, Impaired flexibility, Improper body mechanics, Decreased mobility, Decreased strength, Postural dysfunction  Visit Diagnosis: Cervicalgia  Abnormal posture  Muscle weakness (generalized)  Other symptoms and signs involving the musculoskeletal system     Problem List Patient Active Problem List   Diagnosis Date Noted   Iron deficiency anemia 01/06/2020   Midline low back pain without sciatica 07/09/2019   Prediabetes 01/09/2019   Migraine 09/09/2018   H. pylori infection 05/03/2016   Dyspepsia 05/02/2016   Palpitations 11/28/2015   Panic attacks 06/14/2015   Nicotine vapor product user 10/11/2014   Essential hypertension 06/30/2014   Hypogonadism in male 12/15/2013   History of suicide attempt 12/10/2013   Degenerative disc disease, cervical 03/09/2013   History of thrombocytosis 03/09/2013   Erectile dysfunction 12/24/2012   Kerin Perna, PTA 12/15/20 3:30 PM  [place], [place], [address] Phone: [phone]   Fax:  [phone]  Name: Lucien Budney MRN: [id number] Date of Birth: 09/22/1975

## 2020-12-16 ENCOUNTER — Telehealth: Payer: Self-pay

## 2020-12-16 NOTE — Telephone Encounter (Signed)
Medication: Ubrogepant (UBRELVY) 50 MG TABS Prior authorization determination received Medication has been approved Approval dates: 11/21/2020-11/20/2021  Patient aware via: [place] aware: Yes Provider aware via this encounter

## 2020-12-23 ENCOUNTER — Emergency Department (INDEPENDENT_AMBULATORY_CARE_PROVIDER_SITE_OTHER): Payer: BC Managed Care – PPO

## 2020-12-23 ENCOUNTER — Other Ambulatory Visit: Payer: Self-pay

## 2020-12-23 ENCOUNTER — Emergency Department
Admission: EM | Admit: 2020-12-23 | Discharge: 2020-12-23 | Disposition: A | Discharge status: Home / Self Care | Payer: BC Managed Care – PPO | Source: Home / Self Care | Attending: Family Medicine | Admitting: Family Medicine

## 2020-12-23 DIAGNOSIS — R0600 Dyspnea, unspecified: Secondary | ICD-10-CM | POA: Diagnosis not present

## 2020-12-23 IMAGING — DX DG CHEST 2V
2 series · 2 of 2 positions shown · non-contrast
Comparison: [DATE]

CLINICAL DATA: Dyspnea.

EXAM:
CHEST - 2 VIEW

[chest pa]
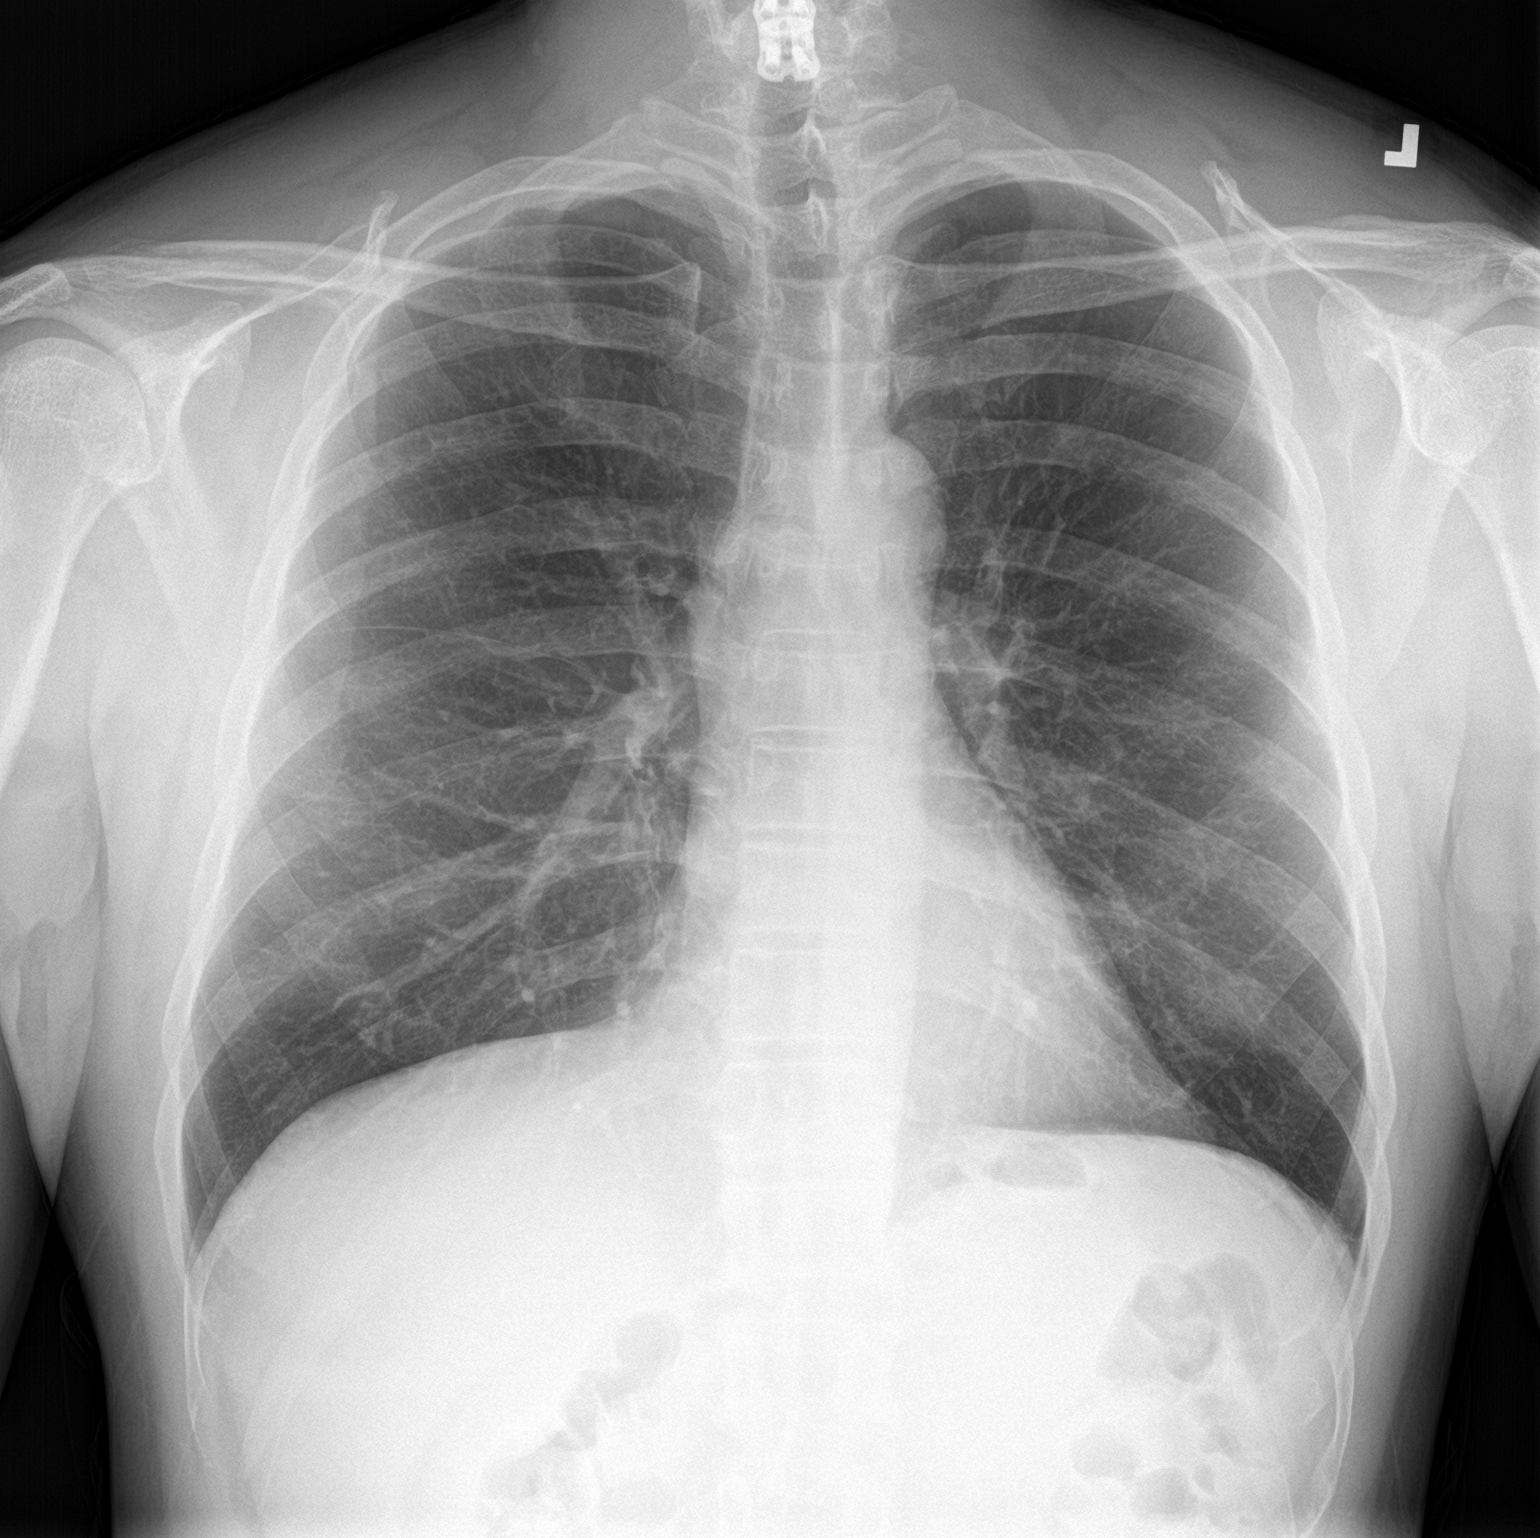

[chest lat]
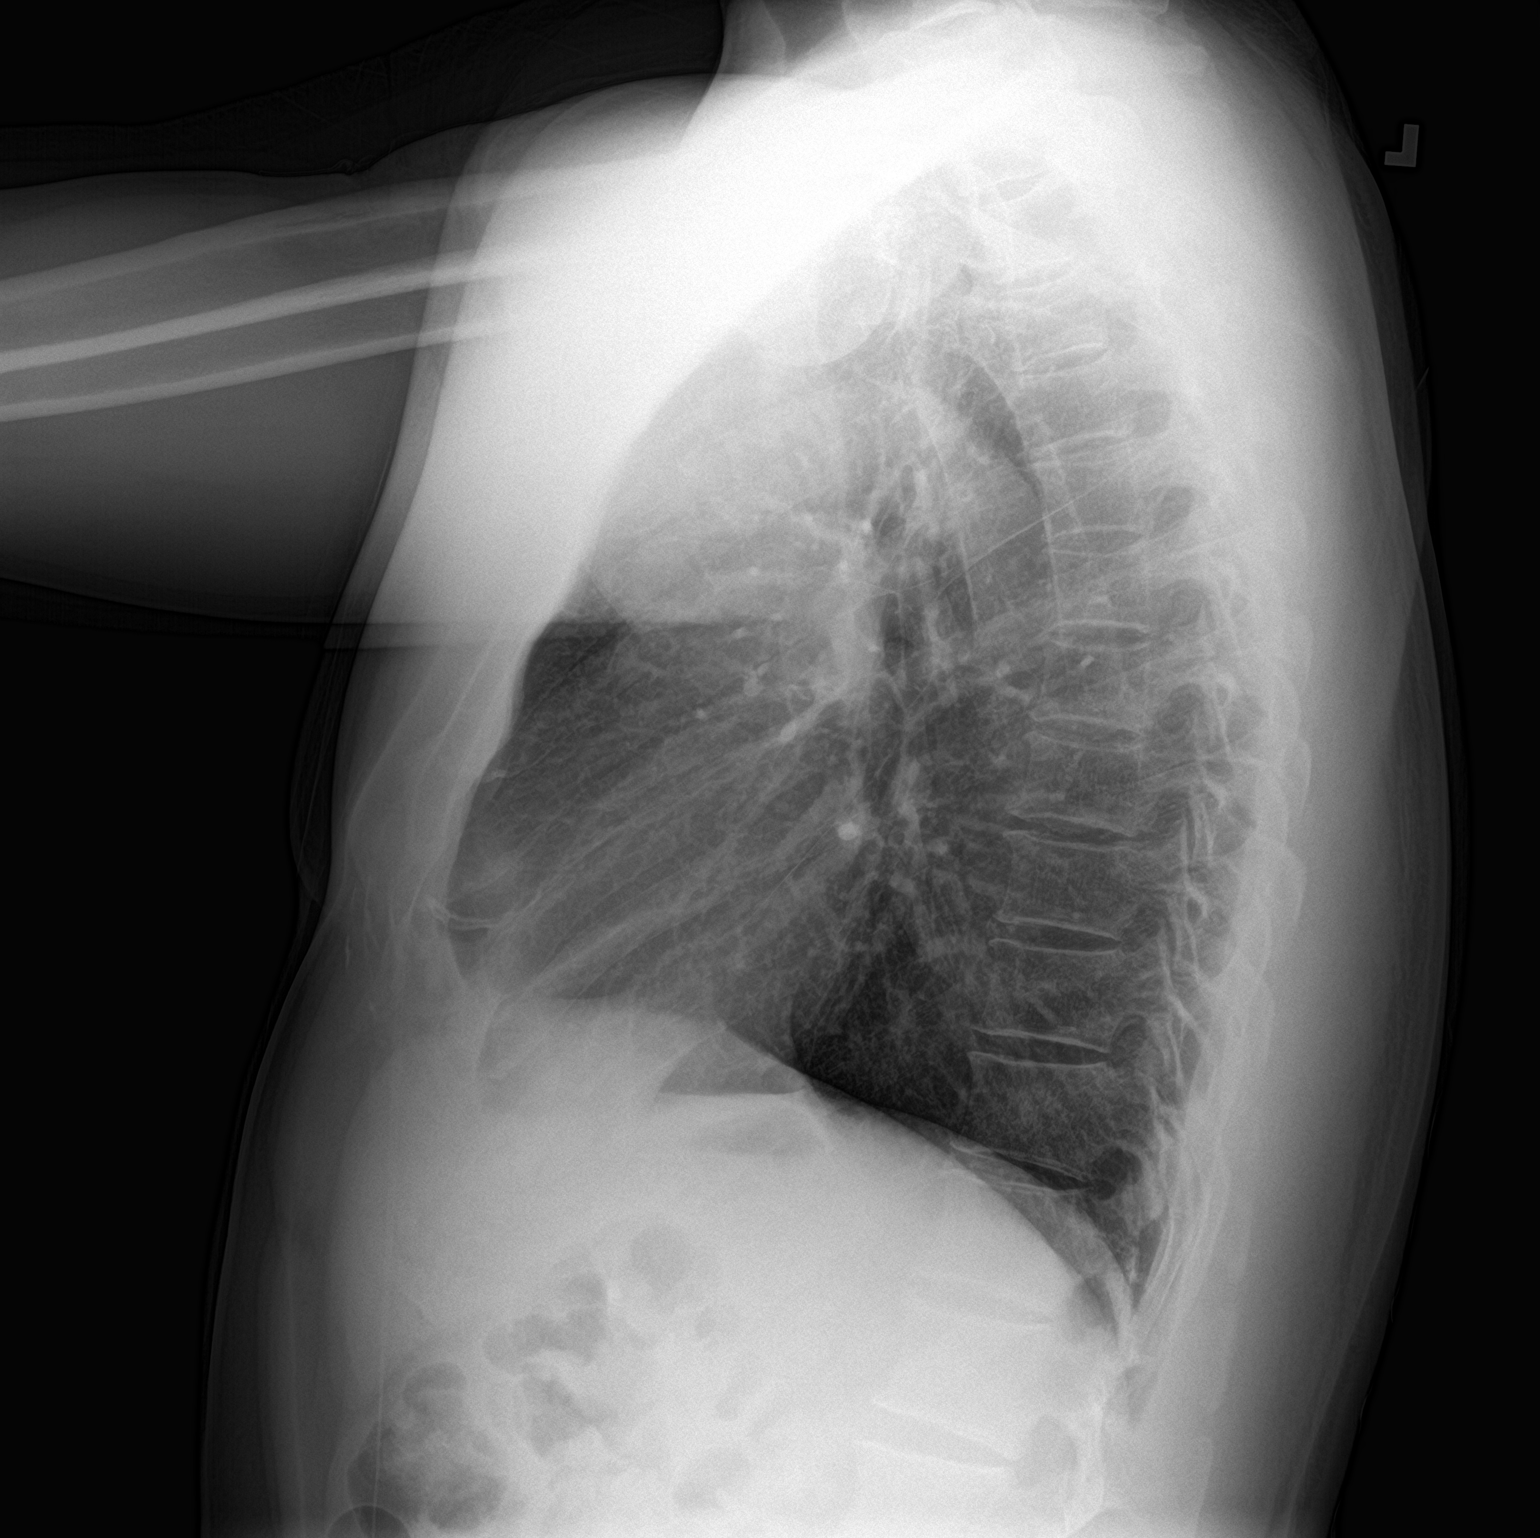

[2 of 2 positions shown; findings below may reference images not displayed]

FINDINGS: Lower cervical spine fixation. Midline trachea. Normal heart size
and mediastinal contours. No pleural effusion or pneumothorax. Clear
lungs.
IMPRESSION: Normal chest.

## 2020-12-23 NOTE — ED Triage Notes (Addendum)
Pt presents with sob, pt st this episode started 3 am after taking a migraine medication. Pt st this has been on and off for about 3 weeks. Pt had neck surgery 3 months ago and he is concerned this could be affecting his airway. Pt st he uses an incentive spirometer but it does not seem to be helping. Pt st that he has been checking his pulse ox and it has been normal at home.

## 2020-12-23 NOTE — ED Provider Notes (Signed)
[name]    CSN: [id number] Arrival date & time: 12/23/20  1534      History   Chief Complaint Chief Complaint  Patient presents with   Shortness of Breath    HPI Justin Ashley is a 45 y.o. male.   Patient complains of intermittently recurring shortness of breath when supine at night for about 3 weeks.  He awoke today at 3am with shortness of breath, but his SpO2 was 97% at that time.  He denies shortness of breath during the day and with physical activity.  He has a history of GERD, but has never had symptoms at night. Past has a history of right side cervical radiculopathy and spinal stenosis C4 through C6, having undergone anterior cervical decompression/fusion of C4/5 and C5/6 on 09/08/20.  He recovered without problems and was recently advised to resume exercise (running).  Patient is concerned that his recent surgery may be affecting his airway.  He has been checking his SpO2 with a home pulse oximeter, admitting that his measurements have been normal.  He notes that he has been using an incentive spirometer without improvement.  He admits to using e-cigarettes.  He has an albuterol inhaler, but has not tried using it when symptomatic at night (however, his symptoms resolve upon arising).  The history is provided by the patient.  Shortness of Breath Severity:  Mild Onset quality:  Gradual Duration:  3 weeks Timing:  Sporadic Progression:  Unchanged Chronicity:  New Context comment:  At night when supine Relieved by:  Sitting up Worsened by:  Nothing Ineffective treatments: incentive spirometer. Associated symptoms: PND   Associated symptoms: no chest pain, no cough, no diaphoresis, no fever and no wheezing   Risk factors: recent surgery   Risk factors: no hx of PE/DVT    Past Medical History:  Diagnosis Date   Dislocation of metatarsophalangeal joint of right great toe    Erectile dysfunction 12/24/2012   BCBS will not cover Cialis    Essential  hypertension 06/30/2014   GERD (gastroesophageal reflux disease)    Headache    History of kidney stones    History of thrombocytosis 03/09/2013   Hypogonadism in male 12/15/2013   Recheck therapy January 2016    Kidney stone    Pneumonia age 25   Pre-diabetes    Sleep apnea    self report   Suicide attempt ([place]) 12/10/2013    Patient Active Problem List   Diagnosis Date Noted   Iron deficiency anemia 01/06/2020   Midline low back pain without sciatica 07/09/2019   Prediabetes 01/09/2019   Migraine 09/09/2018   H. pylori infection 05/03/2016   Dyspepsia 05/02/2016   Palpitations 11/28/2015   Panic attacks 06/14/2015   Nicotine vapor product user 10/11/2014   Essential hypertension 06/30/2014   Hypogonadism in male 12/15/2013   History of suicide attempt 12/10/2013   Degenerative disc disease, cervical 03/09/2013   History of thrombocytosis 03/09/2013   Erectile dysfunction 12/24/2012    Past Surgical History:  Procedure Laterality Date   ANTERIOR CERVICAL DECOMP/DISCECTOMY FUSION N/A 09/08/2020   Procedure: ANTERIOR CERVICAL DECOMPRESSION FUSION CERVICAL 4- CERVICAL5 , CERVICAL 5 - CERVICAL 6 WITH INSTRUMENTATION AND ALLOGRAFT;  Surgeon: Phylliss Bob, MD;  Location: [facility];  Service: Orthopedics;  Laterality: N/A;   DENTAL SURGERY Left 2018   Has metal maybe titanium   NO PAST SURGERIES         Home Medications    Prior to Admission medications   Medication Sig  Start Date End Date Taking? Authorizing Provider  albuterol (VENTOLIN HFA) 108 (90 Base) MCG/ACT inhaler INHALE 2 PUFFS INTO THE LUNGS EVERY 6 HOURS AS NEEDED FOR WHEEZING OR SHORTNESS OF BREATH Patient taking differently: Inhale 2 puffs into the lungs every 6 (six) hours as needed for wheezing or shortness of breath. 07/14/20   Luetta Nutting, DO  AMBULATORY NON FORMULARY MEDICATION BD 78mL syringe and BD 22G 1 and 1/2" needle, use to inject testosterone every two weeks.  BD 18G 1 and 1/2" needle used to draw  up testosterone. Dx: Hypogonadism 06/30/14   Marcial Pacas, DO  clonazePAM (KLONOPIN) 0.5 MG tablet Take 1 tablet (0.5 mg total) by mouth 2 (two) times daily as needed for anxiety. Patient taking differently: Take 0.5 mg by mouth daily as needed for anxiety. 03/17/20   Luetta Nutting, DO  fluticasone (FLONASE) 50 MCG/ACT nasal spray INHALE 1 SPRAY IN EACH NOSTRIL TWICE DAILY( USE LEFT HAND FOR RIGHT NOSTRIL AND RIGHT HAND FOR LEFT NOSTRIL) Patient taking differently: Place 1 spray into both nostrils 2 (two) times daily as needed for allergies. 02/12/18   Gregor Hams, MD  HYDROcodone-acetaminophen (NORCO/VICODIN) 5-325 MG tablet Take 1 tablet by mouth every 6 (six) hours as needed for moderate pain or severe pain. Patient not taking: No sig reported 09/08/20 09/08/21  McKenzie, Lennie Muckle, PA-C  losartan (COZAAR) 50 MG tablet TAKE 1 TABLET(50 MG) BY MOUTH DAILY Patient taking differently: Take 50 mg by mouth at bedtime. 07/04/20   Luetta Nutting, DO  methocarbamol (ROBAXIN) 500 MG tablet Take 1 tablet (500 mg total) by mouth every 6 (six) hours as needed for muscle spasms. 09/08/20   McKenzie, Lennie Muckle, PA-C  pantoprazole (PROTONIX) 40 MG tablet Take 1 tablet (40 mg total) by mouth daily. Patient taking differently: Take 40 mg by mouth daily as needed (acid reflux). 10/13/19   Jackquline Denmark, MD  pregabalin (LYRICA) 75 MG capsule 1 capsule p.o. twice daily to 3 times daily Patient taking differently: Take 75 mg by mouth 3 (three) times daily as needed (pain). 08/19/20   Silverio Decamp, MD  tadalafil (CIALIS) 20 MG tablet Take 0.5-1 tablets (10-20 mg total) by mouth every other day as needed for erectile dysfunction. 03/23/20   Silverio Decamp, MD  testosterone cypionate (DEPOTESTOSTERONE CYPIONATE) 200 MG/ML injection Inject 1.5 mLs (300 mg total) into the muscle every 14 (fourteen) days. 11/21/20   Luetta Nutting, DO  traZODone (DESYREL) 50 MG tablet Take 1-2 tablets (50-100 mg total) by mouth at  bedtime as needed for sleep. 09/14/20   Luetta Nutting, DO  Ubrogepant (UBRELVY) 50 MG TABS TAKE 50 MG INITIALLY AT ONSET OF MIGRAINE. MAY REPEAT AFTER 2 HOURS IF NEEDED Patient taking differently: Take 50 mg by mouth daily as needed (migraine). MAY REPEAT AFTER 2 HOURS IF NEEDED 07/25/20   Luetta Nutting, DO    Family History Family History  Problem Relation Age of Onset   Cancer Mother        lung   Colon polyps Mother    Cancer Father        stomach, deceased @  70yrs   Hypertension Father    Colon cancer Father 76   Stomach cancer Father    Other Paternal Uncle        intestional cancer   Colon cancer Paternal Uncle 37   Stomach cancer Cousin    Colon cancer Cousin 25   Heart attack Cousin    Esophageal cancer Neg Hx  Social History Social History   Tobacco Use   Smoking status: Every Day    Types: E-cigarettes   Smokeless tobacco: Never  Vaping Use   Vaping Use: Every day   Substances: Nicotine, Flavoring  Substance Use Topics   Alcohol use: No   Drug use: No     Allergies   Hydrocodone-acetaminophen, Lisinopril, Oxycodone-acetaminophen, and Zoloft [sertraline hcl]   Review of Systems Review of Systems  Constitutional:  Negative for activity change, appetite change, chills, diaphoresis, fatigue and fever.  HENT: Negative.    Eyes: Negative.   Respiratory:  Positive for shortness of breath. Negative for cough, choking, chest tightness, wheezing and stridor.   Cardiovascular:  Positive for PND. Negative for chest pain, palpitations and leg swelling.  Gastrointestinal: Negative.   Genitourinary: Negative.   Musculoskeletal: Negative.   Neurological: Negative.   Hematological:  Negative for adenopathy.    Physical Exam Triage Vital Signs ED Triage Vitals  Enc Vitals Group     BP 12/23/20 1609 129/80     Pulse Rate 12/23/20 1609 80     Resp 12/23/20 1609 (!) 24     Temp 12/23/20 1609 98.3 F (36.8 C)     Temp Source 12/23/20 1609 Oral     SpO2  12/23/20 1609 97 %     Weight 12/23/20 1605 180 lb (81.6 kg)     Height 12/23/20 1605 5\' 8"  (1.727 m)     Head Circumference --      Peak Flow --      Pain Score 12/23/20 1605 0     Pain Loc --      Pain Edu? --      Excl. in [place]? --    No data found.  Updated Vital Signs BP 129/80 (BP Location: Right Arm)   Pulse 80   Temp 98.3 F (36.8 C) (Oral)   Resp (!) 24   Ht 5\' 8"  (1.727 m)   Wt 81.6 kg   SpO2 97%   BMI 27.37 kg/m   Visual Acuity Right Eye Distance:   Left Eye Distance:   Bilateral Distance:    Right Eye Near:   Left Eye Near:    Bilateral Near:     Physical Exam Vitals and nursing note reviewed.  Constitutional:      General: He is not in acute distress.    Appearance: He is well-developed. He is not ill-appearing.  HENT:     Head: Normocephalic.     Nose: Nose normal. No congestion.     Mouth/Throat:     Mouth: Mucous membranes are moist.     Pharynx: Oropharynx is clear. No pharyngeal swelling.  Eyes:     Conjunctiva/sclera: Conjunctivae normal.     Pupils: Pupils are equal, round, and reactive to light.  Neck:     Comments: Will healed anterior surgical scar. Cardiovascular:     Rate and Rhythm: Normal rate and regular rhythm.     Heart sounds: Normal heart sounds.  Pulmonary:     Breath sounds: Normal breath sounds.  Abdominal:     Palpations: Abdomen is soft.     Tenderness: There is no abdominal tenderness.  Musculoskeletal:        General: No tenderness.     Cervical back: Neck supple. No tenderness.     Right lower leg: No edema.     Left lower leg: No edema.  Lymphadenopathy:     Cervical: No cervical adenopathy.  Skin:    General:  Skin is warm and dry.     Findings: No rash.  Neurological:     Mental Status: He is alert and oriented to person, place, and time.     UC Treatments / Results  Labs (all labs ordered are listed, but only abnormal results are displayed) Labs Reviewed - No data to display  EKG   Radiology DG  Chest 2 View  Result Date: 12/23/2020 CLINICAL DATA:  Dyspnea. EXAM: CHEST - 2 VIEW COMPARISON:  09/10/2020 FINDINGS: Lower cervical spine fixation. Midline trachea. Normal heart size and mediastinal contours. No pleural effusion or pneumothorax. Clear lungs. IMPRESSION: Normal chest. Electronically Signed   By: Abigail Miyamoto M.D.   On: 12/23/2020 18:24    Procedures Procedures (including critical care time)  Medications Ordered in UC Medications - No data to display  Initial Impression / Assessment and Plan / UC Course  I have reviewed the triage vital signs and the nursing notes.  Pertinent labs & imaging results that were available during my care of the patient were reviewed by me and considered in my medical decision making (see chart for details).    Benign exam today.  SpO2 97% Normal chest x-ray reassuring. Followup with Family Doctor.  Final Clinical Impressions(s) / UC Diagnoses   Final diagnoses:  Paroxysmal nocturnal dyspnea     Discharge Instructions      Recommend evaluation by a pulmonologist (consider pulmonary function studies and a sleep study).     ED Prescriptions   None       Kandra Nicolas, MD 12/24/20 1205

## 2020-12-23 NOTE — Discharge Instructions (Addendum)
Recommend evaluation by a pulmonologist (consider pulmonary function studies and a sleep study).

## 2020-12-28 ENCOUNTER — Other Ambulatory Visit: Payer: Self-pay | Admitting: Family Medicine

## 2021-01-05 ENCOUNTER — Encounter: Payer: BC Managed Care – PPO | Admitting: Physical Therapy

## 2021-02-14 ENCOUNTER — Encounter: Payer: Self-pay | Admitting: Family Medicine

## 2021-02-14 ENCOUNTER — Other Ambulatory Visit: Payer: Self-pay

## 2021-02-14 ENCOUNTER — Ambulatory Visit (INDEPENDENT_AMBULATORY_CARE_PROVIDER_SITE_OTHER): Payer: BC Managed Care – PPO | Admitting: Family Medicine

## 2021-02-14 VITALS — BP 124/66 | HR 90 | Temp 97.9°F | Ht 68.0 in | Wt 183.9 lb

## 2021-02-14 DIAGNOSIS — Z23 Encounter for immunization: Secondary | ICD-10-CM | POA: Diagnosis not present

## 2021-02-14 DIAGNOSIS — L209 Atopic dermatitis, unspecified: Secondary | ICD-10-CM | POA: Diagnosis not present

## 2021-02-14 MED ORDER — TRIAMCINOLONE ACETONIDE 0.1 % EX CREA: 1.0000 "application " | TOPICAL_CREAM | Freq: Two times a day (BID) | CUTANEOUS | 1 refills | Status: DC

## 2021-02-14 NOTE — Patient Instructions (Signed)
Atopic Dermatitis Atopic dermatitis is a skin disorder that causes inflammation of the skin. It is marked by a red rash and itchy, dry, scaly skin. It is the most common type of eczema. Eczema is a group of skin conditions that cause the skin to become rough and swollen. This condition is generally worse during the cooler wintermonths and often improves during the warm summer months. Atopic dermatitis usually starts showing signs in infancy and can last through adulthood. This condition cannot be passed from one person to another (is not contagious). Atopic dermatitis may not always be present, but when it is, it is called aflare-up. What are the causes? The exact cause of this condition is not known. Flare-ups may be triggered by: Coming in contact with something that you are sensitive or allergic to (allergen). Stress. Certain foods. Extremely hot or cold weather. Harsh chemicals and soaps. Dry air. Chlorine. What increases the risk? This condition is more likely to develop in people who have a personal or family history of: Eczema. Allergies. Asthma. Hay fever. What are the signs or symptoms? Symptoms of this condition include: Dry, scaly skin. Red, itchy rash. Itchiness, which can be severe. This may occur before the skin rash. This can make sleeping difficult. Skin thickening and cracking that can occur over time. How is this diagnosed? This condition is diagnosed based on: Your symptoms. Your medical history. A physical exam. How is this treated? There is no cure for this condition, but symptoms can usually be controlled. Treatment focuses on: Controlling the itchiness and scratching. You may be given medicines, such as antihistamines or steroid creams. Limiting exposure to allergens. Recognizing situations that cause stress and developing a plan to manage stress. If your atopic dermatitis does not get better with medicines, or if it is all over your body (widespread), a  treatment using a specific type of light (phototherapy) may be used. Follow these instructions at home: Skin care  Keep your skin well moisturized. Doing this seals in moisture and helps to prevent dryness. Use unscented lotions that have petroleum in them. Avoid lotions that contain alcohol or water. They can dry the skin. Keep baths or showers short (less than 5 minutes) in warm water. Do not use hot water. Use mild, unscented cleansers for bathing. Avoid soap and bubble bath. Apply a moisturizer to your skin right after a bath or shower. Do not apply anything to your skin without checking with your health care provider.  General instructions Take or apply over-the-counter and prescription medicines only as told by your health care provider. Dress in clothes made of cotton or cotton blends. Dress lightly because heat increases itchiness. When washing your clothes, rinse your clothes twice so all of the soap is removed. Avoid any triggers that can cause a flare-up. Keep your fingernails cut short. Avoid scratching. Scratching makes the rash and itchiness worse. A break in the skin from scratching could result in a skin infection (impetigo). Do not be around people who have cold sores or fever blisters. If you get the infection, it may cause your atopic dermatitis to worsen. Keep all follow-up visits. This is important. Contact a health care provider if: Your itchiness interferes with sleep. Your rash gets worse or is not better within one week of starting treatment. You have a fever. You have a rash flare-up after having contact with someone who has cold sores or fever blisters. Get help right away if: You develop pus or soft yellow scabs in the rash area.   Summary Atopic dermatitis causes a red rash and itchy, dry, scaly skin. Treatment focuses on controlling the itchiness and scratching, limiting exposure to things that you are sensitive or allergic to (allergens), recognizing  situations that cause stress, and developing a plan to manage stress. Keep your skin well moisturized. Keep baths or showers shorter than 5 minutes and use warm water. Do not use hot water. This information is not intended to replace advice given to you by your health care provider. Make sure you discuss any questions you have with your healthcare provider. Document Revised: 12/07/2019 Document Reviewed: 12/07/2019 Elsevier Patient Education  2022 [employer]  

## 2021-02-14 NOTE — Progress Notes (Signed)
Justin Ashley - 45 y.o. male MRN [id number]  Date of birth: [birth year]  Subjective Chief Complaint  Patient presents with   Rash    HPI Justin Ashley is a 45 year old male here today with complaint of rash.  Rash located on abdominal wall as well as elbows.  Rash is itchy.  Appearance is dry and scaly in appearance.  He has tried hydrocortisone cream and this does improve temporarily however returns after discontinuing.  Symptoms seem to be worse during the winter.  ROS:  A comprehensive ROS was completed and negative except as noted per HPI  Allergies  Allergen Reactions   Hydrocodone-Acetaminophen Nausea And Vomiting   Lisinopril Cough    Cough   Oxycodone-Acetaminophen Nausea And Vomiting   Zoloft [Sertraline Hcl]     Suicide attempt    Past Medical History:  Diagnosis Date   Dislocation of metatarsophalangeal joint of right great toe    Erectile dysfunction 12/24/2012   [employer] will not cover Cialis    Essential hypertension 06/30/2014   GERD (gastroesophageal reflux disease)    Headache    History of kidney stones    History of thrombocytosis 03/09/2013   Hypogonadism in male 12/15/2013   Recheck therapy January 2016    Kidney stone    Pneumonia age 18   Pre-diabetes    Sleep apnea    self report   Suicide attempt ([place]) 12/10/2013    Past Surgical History:  Procedure Laterality Date   ANTERIOR CERVICAL DECOMP/DISCECTOMY FUSION N/A 09/08/2020   Procedure: ANTERIOR CERVICAL DECOMPRESSION FUSION CERVICAL 4- CERVICAL5 , CERVICAL 5 - CERVICAL 6 WITH INSTRUMENTATION AND ALLOGRAFT;  Surgeon: Phylliss Bob, MD;  Location: [facility];  Service: Orthopedics;  Laterality: N/A;   DENTAL SURGERY Left 2018   Has metal maybe titanium   NO PAST SURGERIES      Social History   Socioeconomic History   Marital status: Significant Other    Spouse name: Not on file   Number of children: Not on file   Years of education: Not on file   Highest education level: Not on file  Occupational  History   Not on file  Tobacco Use   Smoking status: Every Day    Types: E-cigarettes   Smokeless tobacco: Never  Vaping Use   Vaping Use: Every day   Substances: Nicotine, Flavoring  Substance and Sexual Activity   Alcohol use: No   Drug use: No   Sexual activity: Yes  Other Topics Concern   Not on file  Social History Narrative   Not on file   Social Determinants of Health   Financial Resource Strain: Not on file  Food Insecurity: Not on file  Transportation Needs: Not on file  Physical Activity: Not on file  Stress: Not on file  Social Connections: Not on file    Family History  Problem Relation Age of Onset   Cancer Mother        lung   Colon polyps Mother    Cancer Father        stomach, deceased @  57yrs   Hypertension Father    Colon cancer Father 94   Stomach cancer Father    Other Paternal Uncle        intestional cancer   Colon cancer Paternal Uncle 35   Stomach cancer Cousin    Colon cancer Cousin 30   Heart attack Cousin    Esophageal cancer Neg Hx     Health Maintenance  Topic Date Due  Hepatitis C Screening  Never done   COVID-19 Vaccine (3 - Moderna risk series) 07/27/2019   Pneumococcal Vaccine 51-8 Years old (1 - PCV) 02/14/2022 (Originally 08/04/1979)   COLONOSCOPY (Pts 45-15yrs Insurance coverage will need to be confirmed)  10/11/2024   TETANUS/TDAP  05/02/2026   INFLUENZA VACCINE  Completed   HIV Screening  Completed   HPV VACCINES  Aged Out     ----------------------------------------------------------------------------------------------------------------------------------------------------------------------------------------------------------------- Physical Exam BP 124/66 (BP Location: Left Arm, Patient Position: Sitting, Cuff Size: Normal)   Pulse 90   Temp 97.9 F (36.6 C)   Ht 5\' 8"  (1.727 m)   Wt 183 lb 14.4 oz (83.4 kg)   SpO2 98%   BMI 27.96 kg/m   Physical Exam Constitutional:      Appearance: Normal appearance.   Musculoskeletal:     Cervical back: Neck supple.  Skin:    Comments: Dry, scaly patch on left lower abdomen wall and proximal forearms.  There is some hyperpigmentation.  Neurological:     Mental Status: He is alert.    ------------------------------------------------------------------------------------------------------------------------------------------------------------------------------------------------------------------- Assessment and Plan  Atopic dermatitis Recommend adding moisturizer daily such as CeraVe.  Starting triamcinolone cream twice daily as needed.  He will let me know if this is not improving after a couple weeks.   Meds ordered this encounter  Medications   triamcinolone cream (KENALOG) 0.1 %    Sig: Apply 1 application topically 2 (two) times daily.    Dispense:  80 g    Refill:  1    No follow-ups on file.    This visit occurred during the SARS-CoV-2 public health emergency.  Safety protocols were in place, including screening questions prior to the visit, additional usage of staff PPE, and extensive cleaning of exam room while observing appropriate contact time as indicated for disinfecting solutions.

## 2021-02-14 NOTE — Assessment & Plan Note (Signed)
Recommend adding moisturizer daily such as CeraVe.  Starting triamcinolone cream twice daily as needed.  He will let me know if this is not improving after a couple weeks.

## 2021-02-27 ENCOUNTER — Encounter: Payer: Self-pay | Admitting: Physician Assistant

## 2021-02-27 ENCOUNTER — Other Ambulatory Visit: Payer: Self-pay

## 2021-02-27 ENCOUNTER — Encounter: Payer: Self-pay | Admitting: Family Medicine

## 2021-02-27 ENCOUNTER — Ambulatory Visit (INDEPENDENT_AMBULATORY_CARE_PROVIDER_SITE_OTHER): Payer: BC Managed Care – PPO | Admitting: Physician Assistant

## 2021-02-27 VITALS — BP 138/78 | HR 87 | Ht 68.0 in | Wt 183.0 lb

## 2021-02-27 DIAGNOSIS — R109 Unspecified abdominal pain: Secondary | ICD-10-CM

## 2021-02-27 DIAGNOSIS — R319 Hematuria, unspecified: Secondary | ICD-10-CM | POA: Diagnosis not present

## 2021-02-27 DIAGNOSIS — N200 Calculus of kidney: Secondary | ICD-10-CM | POA: Diagnosis not present

## 2021-02-27 DIAGNOSIS — Z87442 Personal history of urinary calculi: Secondary | ICD-10-CM

## 2021-02-27 LAB — POCT URINALYSIS DIP (CLINITEK)
Bilirubin, UA: NEGATIVE
Glucose, UA: NEGATIVE mg/dL
Ketones, POC UA: NEGATIVE mg/dL
Leukocytes, UA: NEGATIVE
Nitrite, UA: NEGATIVE
POC PROTEIN,UA: NEGATIVE
Spec Grav, UA: <=1.005 — AB (ref 1.010–1.025)
Urobilinogen, UA: 0.2 E.U./dL
pH, UA: 6 (ref 5.0–8.0)

## 2021-02-27 MED ORDER — IBUPROFEN 800 MG PO TABS: 800.0000 mg | ORAL_TABLET | Freq: Three times a day (TID) | ORAL | 0 refills | Status: DC | PRN

## 2021-02-27 MED ORDER — KETOROLAC TROMETHAMINE 60 MG/2ML IM SOLN
60.0000 mg | Freq: Once | INTRAMUSCULAR | Status: AC
  Administered 2021-02-27: 12:00:00 60 mg via INTRAMUSCULAR

## 2021-02-27 MED ORDER — TAMSULOSIN HCL 0.4 MG PO CAPS: 0.4000 mg | ORAL_CAPSULE | Freq: Every day | ORAL | 1 refills | Status: DC

## 2021-02-27 NOTE — Progress Notes (Signed)
Subjective:    Patient ID: Justin Ashley, male    DOB: [birth year], 45 y.o.   MRN: [id number]  HPI Pt is a 45 yo male with hx of kidney stones who presents to the clinic with left flank pain that started 6pm last night. Last kidney stone was 6 years ago. He has always been able to pass them. No fever, chills, body aches. Pain is 4-6 out of 10 intermittently. Taking tylenol with minimal relief. He does not want opiates.   .. Active Ambulatory Problems    Diagnosis Date Noted   Erectile dysfunction 12/24/2012   Degenerative disc disease, cervical 03/09/2013   History of thrombocytosis 03/09/2013   History of suicide attempt 12/10/2013   Hypogonadism in male 12/15/2013   Essential hypertension 06/30/2014   Nicotine vapor product user 10/11/2014   Panic attacks 06/14/2015   Palpitations 11/28/2015   Dyspepsia 05/02/2016   H. pylori infection 05/03/2016   Migraine 09/09/2018   Prediabetes 01/09/2019   Midline low back pain without sciatica 07/09/2019   Iron deficiency anemia 01/06/2020   Atopic dermatitis 02/14/2021   Resolved Ambulatory Problems    Diagnosis Date Noted   Left trigger finger 12/24/2012   Acne vulgaris 12/10/2013   Shortness of breath 10/11/2014   Low blood potassium 05/23/2016   Past Medical History:  Diagnosis Date   Dislocation of metatarsophalangeal joint of right great toe    GERD (gastroesophageal reflux disease)    Headache    History of kidney stones    Kidney stone    Pneumonia age 71   Pre-diabetes    Sleep apnea    Suicide attempt ([place]) 12/10/2013      Review of Systems See HPI.     Objective:   Physical Exam Vitals reviewed.  Constitutional:      Appearance: Normal appearance.  HENT:     Head: Normocephalic.  Cardiovascular:     Rate and Rhythm: Normal rate and regular rhythm.     Pulses: Normal pulses.  Pulmonary:     Effort: Pulmonary effort is normal.     Breath sounds: Normal breath sounds.  Abdominal:     General: Bowel  sounds are normal.     Palpations: Abdomen is soft.     Tenderness: There is left CVA tenderness. There is no right CVA tenderness.  Neurological:     General: No focal deficit present.     Mental Status: He is alert and oriented to person, place, and time.  Psychiatric:        Mood and Affect: Mood normal.     .. Results for orders placed or performed in visit on 02/27/21  POCT URINALYSIS DIP (CLINITEK)  Result Value Ref Range   Color, UA yellow yellow   Clarity, UA clear clear   Glucose, UA negative negative mg/dL   Bilirubin, UA negative negative   Ketones, POC UA negative negative mg/dL   Spec Grav, UA <=1.005 (A) 1.010 - 1.025   Blood, UA trace-intact (A) negative   pH, UA 6.0 5.0 - 8.0   POC PROTEIN,UA negative negative, trace   Urobilinogen, UA 0.2 0.2 or 1.0 E.U./dL   Nitrite, UA Negative Negative   Leukocytes, UA Negative Negative        Assessment & Plan:  Marland KitchenMarland KitchenLadislaus was seen today for flank pain.  Diagnoses and all orders for this visit:  Kidney stone on left side -     tamsulosin (FLOMAX) 0.4 MG CAPS capsule; Take 1 capsule (0.4 mg total)  by mouth daily after breakfast. -     ibuprofen (ADVIL) 800 MG tablet; Take 1 tablet (800 mg total) by mouth every 8 (eight) hours as needed. -     ketorolac (TORADOL) injection 60 mg  Left flank pain -     POCT URINALYSIS DIP (CLINITEK) -     Urine Culture -     ibuprofen (ADVIL) 800 MG tablet; Take 1 tablet (800 mg total) by mouth every 8 (eight) hours as needed. -     ketorolac (TORADOL) injection 60 mg  Hematuria, unspecified type -     Urine Culture -     ibuprofen (ADVIL) 800 MG tablet; Take 1 tablet (800 mg total) by mouth every 8 (eight) hours as needed.  History of kidney stones -     ibuprofen (ADVIL) 800 MG tablet; Take 1 tablet (800 mg total) by mouth every 8 (eight) hours as needed.   UA positive for blood.  Will culture 4-6 pain scale with hx of kidney stone.  Pt given strainer. Start flomax and  increase hydration.  Declined opiates.  Toradol 60mg  given in office today.  Alternate ibuprofen and tylenol for pain If not passing stone in next 24-48 hours will get CT scan.  HO given.

## 2021-02-27 NOTE — Patient Instructions (Signed)
Dietary Guidelines to Help Prevent Kidney Stones °Kidney stones are deposits of minerals and salts that form inside your kidneys. Your risk of developing kidney stones may be greater depending on your diet, your lifestyle, the medicines you take, and whether you have certain medical conditions. Most people can lower their chances of developing kidney stones by following the instructions below. Your dietitian may give you more specific instructions depending on your overall health and the type of kidney stones you tend to develop. °What are tips for following this plan? °Reading food labels ° °Choose foods with "no salt added" or "low-salt" labels. Limit your salt (sodium) intake to less than 1,500 mg a day. °Choose foods with calcium for each meal and snack. Try to eat about 300 mg of calcium at each meal. Foods that contain 200-500 mg of calcium a serving include: °8 oz (237 mL) of milk, calcium-fortifiednon-dairy milk, and calcium-fortifiedfruit juice. Calcium-fortified means that calcium has been added to these drinks. °8 oz (237 mL) of kefir, yogurt, and soy yogurt. °4 oz (114 g) of tofu. °1 oz (28 g) of cheese. °1 cup (150 g) of dried figs. °1 cup (91 g) of cooked broccoli. °One 3 oz (85 g) can of sardines or mackerel. °Most people need 1,000-1,500 mg of calcium a day. Talk to your dietitian about how much calcium is recommended for you. °Shopping °Buy plenty of fresh fruits and vegetables. Most people do not need to avoid fruits and vegetables, even if these foods contain nutrients that may contribute to kidney stones. °When shopping for convenience foods, choose: °Whole pieces of fruit. °Pre-made salads with dressing on the side. °Low-fat fruit and yogurt smoothies. °Avoid buying frozen meals or prepared deli foods. These can be high in sodium. °Look for foods with live cultures, such as yogurt and kefir. °Choose high-fiber grains, such as whole-wheat breads, oat bran, and wheat cereals. °Cooking °Do not add  salt to food when cooking. Place a salt shaker on the table and allow each person to add his or her own salt to taste. °Use vegetable protein, such as beans, textured vegetable protein (TVP), or tofu, instead of meat in pasta, casseroles, and soups. °Meal planning °Eat less salt, if told by your dietitian. To do this: °Avoid eating processed or pre-made food. °Avoid eating fast food. °Eat less animal protein, including cheese, meat, poultry, or fish, if told by your dietitian. To do this: °Limit the number of times you have meat, poultry, fish, or cheese each week. Eat a diet free of meat at least 2 days a week. °Eat only one serving each day of meat, poultry, fish, or seafood. °When you prepare animal protein, cut pieces into small portion sizes. For most meat and fish, one serving is about the size of the palm of your hand. °Eat at least five servings of fresh fruits and vegetables each day. To do this: °Keep fruits and vegetables on hand for snacks. °Eat one piece of fruit or a handful of berries with breakfast. °Have a salad and fruit at lunch. °Have two kinds of vegetables at dinner. °Limit foods that are high in a substance called oxalate. These include: °Spinach (cooked), rhubarb, beets, sweet potatoes, and Swiss chard. °Peanuts. °Potato chips, french fries, and baked potatoes with skin on. °Nuts and nut products. °Chocolate. °If you regularly take a diuretic medicine, make sure to eat at least 1 or 2 servings of fruits or vegetables that are high in potassium each day. These include: °Avocado. °Banana. °Orange, prune,   carrot, or tomato juice. °Baked potato. °Cabbage. °Beans and split peas. °Lifestyle ° °Drink enough fluid to keep your urine pale yellow. This is the most important thing you can do. Spread your fluid intake throughout the day. °If you drink alcohol: °Limit how much you use to: °0-1 drink a day for women who are not pregnant. °0-2 drinks a day for men. °Be aware of how much alcohol is in your  drink. In the [place] one drink equals one 12 oz bottle of beer (355 mL), one 5 oz glass of wine (148 mL), or one 1½ oz glass of hard liquor (44 mL). °Lose weight if told by your health care provider. Work with your dietitian to find an eating plan and weight loss strategies that work best for you. °General information °Talk to your health care provider and dietitian about taking daily supplements. You may be told the following depending on your health and the cause of your kidney stones: °Not to take supplements with vitamin C. °To take a calcium supplement. °To take a daily probiotic supplement. °To take other supplements such as magnesium, fish oil, or vitamin B6. °Take over-the-counter and prescription medicines only as told by your health care provider. These include supplements. °What foods should I limit? °Limit your intake of the following foods, or eat them as told by your dietitian. °Vegetables °Spinach. Rhubarb. Beets. Canned vegetables. Pickles. Olives. Baked potatoes with skin. °Grains °Wheat bran. Baked goods. Salted crackers. Cereals high in sugar. °Meats and other proteins °Nuts. Nut butters. Large portions of meat, poultry, or fish. Salted, precooked, or cured meats, such as sausages, meat loaves, and hot dogs. °Dairy °Cheese. °Beverages °Regular soft drinks. Regular vegetable juice. °Seasonings and condiments °Seasoning blends with salt. Salad dressings. Soy sauce. Ketchup. Barbecue sauce. °Other foods °Canned soups. Canned pasta sauce. Casseroles. Pizza. Lasagna. Frozen meals. Potato chips. French fries. °The items listed above may not be a complete list of foods and beverages you should limit. Contact a dietitian for more information. °What foods should I avoid? °Talk to your dietitian about specific foods you should avoid based on the type of kidney stones you have and your overall health. °Fruits °Grapefruit. °The item listed above may not be a complete list of foods and beverages you should  avoid. Contact a dietitian for more information. °Summary °Kidney stones are deposits of minerals and salts that form inside your kidneys. °You can lower your risk of kidney stones by making changes to your diet. °The most important thing you can do is drink enough fluid. Drink enough fluid to keep your urine pale yellow. °Talk to your dietitian about how much calcium you should have each day, and eat less salt and animal protein as told by your dietitian. °This information is not intended to replace advice given to you by your health care provider. Make sure you discuss any questions you have with your health care provider. °Document Revised: 02/19/2019 Document Reviewed: 02/19/2019 °Elsevier Patient Education © 2022 [employer] Inc. ° °Kidney Stones °Kidney stones are solid, rock-like deposits that form inside of the kidneys. The kidneys are a pair of organs that make urine. A kidney stone may form in a kidney and move into other parts of the urinary tract, including the tubes that connect the kidneys to the bladder (ureters), the bladder, and the tube that carries urine out of the body (urethra). As the stone moves through these areas, it can cause intense pain and block the flow of urine. °Kidney stones are created   when high levels of certain minerals are found in the urine. The stones are usually passed out of the body through urination, but in some cases, medical treatment may be needed to remove them. °What are the causes? °Kidney stones may be caused by: °A condition in which certain glands produce too much parathyroid hormone (primary hyperparathyroidism), which causes too much calcium buildup in the blood. °A buildup of uric acid crystals in the bladder (hyperuricosuria). Uric acid is a chemical that the body produces when you eat certain foods. It usually exits the body in the urine. °Narrowing (stricture) of one or both of the ureters. °A kidney blockage that is present at birth (congenital  obstruction). °Past surgery on the kidney or the ureters, such as gastric bypass surgery. °What increases the risk? °The following factors may make you more likely to develop this condition: °Having had a kidney stone in the past. °Having a family history of kidney stones. °Not drinking enough water. °Eating a diet that is high in protein, salt (sodium), or sugar. °Being overweight or obese. °What are the signs or symptoms? °Symptoms of a kidney stone may include: °Pain in the side of the abdomen, right below the ribs (flank pain). Pain usually spreads (radiates) to the groin. °Needing to urinate frequently or urgently. °Painful urination. °Blood in the urine (hematuria). °Nausea. °Vomiting. °Fever and chills. °How is this diagnosed? °This condition may be diagnosed based on: °Your symptoms and medical history. °A physical exam. °Blood tests. °Urine tests. These may be done before and after the stone passes out of your body through urination. °Imaging tests, such as a CT scan, abdominal X-ray, or ultrasound. °A procedure to examine the inside of the bladder (cystoscopy). °How is this treated? °Treatment for kidney stones depends on the size, location, and makeup of the stones. Kidney stones will often pass out of the body through urination. You may need to: °Increase your fluid intake to help pass the stone. In some cases, you may be given fluids through an IV and may need to be monitored at the hospital. °Take medicine for pain. °Make changes in your diet to help prevent kidney stones from coming back. °Sometimes, medical procedures are needed to remove a kidney stone. This may involve: °A procedure to break up kidney stones using: °A focused beam of light (laser therapy). °Shock waves (extracorporeal shock wave lithotripsy). °Surgery to remove kidney stones. This may be needed if you have severe pain or have stones that block your urinary tract. °Follow these instructions at home: °Medicines °Take over-the-counter  and prescription medicines only as told by your health care provider. °Ask your health care provider if the medicine prescribed to you requires you to avoid driving or using heavy machinery. °Eating and drinking °Drink enough fluid to keep your urine pale yellow. You may be instructed to drink at least 8-10 glasses of water each day. This will help you pass the kidney stone. °If directed, change your diet. This may include: °Limiting how much sodium you eat. °Eating more fruits and vegetables. °Limiting how much animal protein--such as red meat, poultry, fish, and eggs--you eat. °Follow instructions from your health care provider about eating or drinking restrictions. °General instructions °Collect urine samples as told by your health care provider. You may need to collect a urine sample: °24 hours after you pass the stone. °8-12 weeks after passing the kidney stone, and every 6-12 months after that. °Strain your urine every time you urinate, for as long as directed. Use the   strainer that your health care provider recommends. °Do not throw out the kidney stone after passing it. Keep the stone so it can be tested by your health care provider. Testing the makeup of your kidney stone may help prevent you from getting kidney stones in the future. °Keep all follow-up visits as told by your health care provider. This is important. You may need follow-up X-rays or ultrasounds to make sure that your stone has passed. °How is this prevented? °To prevent another kidney stone: °Drink enough fluid to keep your urine pale yellow. This is the best way to prevent kidney stones. °Eat a healthy diet and follow recommendations from your health care provider about foods to avoid. You may be instructed to eat a low-protein diet. Recommendations vary depending on the type of kidney stone that you have. °Maintain a healthy weight. °Where to find more information °National Kidney Foundation (NKF): www.kidney.org °[facility] [facility]  ([facility]): www.urologyhealth.org °Contact a health care provider if: °You have pain that gets worse or does not get better with medicine. °Get help right away if: °You have a fever or chills. °You develop severe pain. °You develop new abdominal pain. °You faint. °You are unable to urinate. °Summary °Kidney stones are solid, rock-like deposits that form inside of the kidneys. °Kidney stones can cause nausea, vomiting, blood in the urine, abdominal pain, and the urge to urinate frequently. °Treatment for kidney stones depends on the size, location, and makeup of the stones. Kidney stones will often pass out of the body through urination. °Kidney stones can be prevented by drinking enough fluids, eating a healthy diet, and maintaining a healthy weight. °This information is not intended to replace advice given to you by your health care provider. Make sure you discuss any questions you have with your health care provider. °Document Revised: 07/11/2018 Document Reviewed: 07/15/2018 °Elsevier Patient Education © 2022 Elsevier Inc. ° °

## 2021-03-01 LAB — URINE CULTURE
MICRO NUMBER:: 12776775
SPECIMEN QUALITY:: ADEQUATE

## 2021-03-02 NOTE — Progress Notes (Signed)
No significant bacteria accumulation. Find out if he has passed his kidney stone?

## 2021-03-10 DIAGNOSIS — Z20822 Contact with and (suspected) exposure to covid-19: Secondary | ICD-10-CM | POA: Diagnosis not present

## 2021-03-25 ENCOUNTER — Other Ambulatory Visit: Payer: Self-pay | Admitting: Family Medicine

## 2021-04-20 ENCOUNTER — Encounter: Payer: Self-pay | Admitting: Family Medicine

## 2021-04-20 DIAGNOSIS — M5030 Other cervical disc degeneration, unspecified cervical region: Secondary | ICD-10-CM

## 2021-04-20 MED ORDER — PREGABALIN 75 MG PO CAPS: ORAL_CAPSULE | ORAL | 3 refills | Status: DC

## 2021-04-20 MED ORDER — METHOCARBAMOL 500 MG PO TABS: 500.0000 mg | ORAL_TABLET | Freq: Four times a day (QID) | ORAL | 0 refills | Status: DC | PRN

## 2021-04-20 NOTE — Addendum Note (Signed)
Addended by: Perlie Mayo on: 04/20/2021 04:59 PM   Modules accepted: Orders

## 2021-06-24 ENCOUNTER — Other Ambulatory Visit: Payer: Self-pay | Admitting: Family Medicine

## 2021-06-26 NOTE — Telephone Encounter (Signed)
Pls contact pt to schedule 6-moth follow-up due in June. Thanks ?

## 2021-06-26 NOTE — Telephone Encounter (Signed)
Patient has been scheduled for June for 6 mo f/u. AMUCK ?

## 2021-08-22 ENCOUNTER — Other Ambulatory Visit: Payer: Self-pay

## 2021-08-22 DIAGNOSIS — E291 Testicular hypofunction: Secondary | ICD-10-CM

## 2021-08-22 MED ORDER — TESTOSTERONE CYPIONATE 200 MG/ML IM SOLN: 300.0000 mg | INTRAMUSCULAR | 2 refills | Status: DC

## 2021-08-22 NOTE — Progress Notes (Signed)
Completed.

## 2021-08-23 ENCOUNTER — Ambulatory Visit: Payer: BC Managed Care – PPO | Admitting: Family Medicine

## 2021-08-23 ENCOUNTER — Encounter: Payer: Self-pay | Admitting: Family Medicine

## 2021-08-23 ENCOUNTER — Telehealth: Payer: Self-pay

## 2021-08-23 VITALS — BP 132/86 | HR 96 | Ht 68.0 in | Wt 176.0 lb

## 2021-08-23 DIAGNOSIS — M545 Low back pain, unspecified: Secondary | ICD-10-CM

## 2021-08-23 DIAGNOSIS — I10 Essential (primary) hypertension: Secondary | ICD-10-CM

## 2021-08-23 DIAGNOSIS — Z13220 Encounter for screening for lipoid disorders: Secondary | ICD-10-CM | POA: Diagnosis not present

## 2021-08-23 DIAGNOSIS — G8929 Other chronic pain: Secondary | ICD-10-CM

## 2021-08-23 DIAGNOSIS — E291 Testicular hypofunction: Secondary | ICD-10-CM

## 2021-08-23 DIAGNOSIS — M5030 Other cervical disc degeneration, unspecified cervical region: Secondary | ICD-10-CM

## 2021-08-23 DIAGNOSIS — R7303 Prediabetes: Secondary | ICD-10-CM | POA: Diagnosis not present

## 2021-08-23 MED ORDER — PREGABALIN 100 MG PO CAPS: ORAL_CAPSULE | ORAL | 0 refills | Status: DC

## 2021-08-23 NOTE — Assessment & Plan Note (Signed)
Improved after surgery however continues to have some mild radicular symptoms.  He forgets to take Lyrica 3 times a day.  Switching to 100 mg twice per day.

## 2021-08-23 NOTE — Progress Notes (Signed)
Justin Ashley - 46 y.o. male MRN [id number]  Date of birth: 1975/06/12  Subjective Chief Complaint  Patient presents with   Post-op Problem   Back Pain    HPI Justin Ashley is a 46 year old male here today for follow-up visit.  He is status post anterior cervical decompression.  Continues to have some mild symptoms that radiate into the right arm.  Denies any weakness.  He is taking Lyrica, feels like this possible needs to be adjusted.  He is continue to have some lower back pain.  Some radiation into the buttock but nothing down the leg.  Denies any weakness, numbness or tingling.  He is interested in having an MRI for further exploration of his ongoing pain.  Reports he feels pretty good with current dose of testosterone.  No problems with doing injections.  Blood pressure remains well controlled with losartan.  No side effects with this.  Denies chest pain, shortness of breath, palpitations, headaches or vision changes.  ROS:  A comprehensive ROS was completed and negative except as noted per HPI  Allergies  Allergen Reactions   Hydrocodone-Acetaminophen Nausea And Vomiting   Lisinopril Cough    Cough   Oxycodone-Acetaminophen Nausea And Vomiting   Zoloft [Sertraline Hcl]     Suicide attempt    Past Medical History:  Diagnosis Date   Dislocation of metatarsophalangeal joint of right great toe    Erectile dysfunction 12/24/2012   BCBS will not cover Cialis    Essential hypertension 06/30/2014   GERD (gastroesophageal reflux disease)    Headache    History of kidney stones    History of thrombocytosis 03/09/2013   Hypogonadism in male 12/15/2013   Recheck therapy January 2016    Kidney stone    Pneumonia age 30   Pre-diabetes    Sleep apnea    self report   Suicide attempt ([place]) 12/10/2013    Past Surgical History:  Procedure Laterality Date   ANTERIOR CERVICAL DECOMP/DISCECTOMY FUSION N/A 09/08/2020   Procedure: ANTERIOR CERVICAL DECOMPRESSION FUSION CERVICAL 4-  CERVICAL5 , CERVICAL 5 - CERVICAL 6 WITH INSTRUMENTATION AND ALLOGRAFT;  Surgeon: Phylliss Bob, MD;  Location: [facility];  Service: Orthopedics;  Laterality: N/A;   DENTAL SURGERY Left 2018   Has metal maybe titanium   NO PAST SURGERIES      Social History   Socioeconomic History   Marital status: Significant Other    Spouse name: Not on file   Number of children: Not on file   Years of education: Not on file   Highest education level: Not on file  Occupational History   Not on file  Tobacco Use   Smoking status: Every Day    Types: E-cigarettes   Smokeless tobacco: Never  Vaping Use   Vaping Use: Every day   Substances: Nicotine, Flavoring  Substance and Sexual Activity   Alcohol use: No   Drug use: No   Sexual activity: Yes  Other Topics Concern   Not on file  Social History Narrative   Not on file   Social Determinants of Health   Financial Resource Strain: Not on file  Food Insecurity: Not on file  Transportation Needs: Not on file  Physical Activity: Not on file  Stress: Not on file  Social Connections: Not on file    Family History  Problem Relation Age of Onset   Cancer Mother        lung   Colon polyps Mother    Cancer Father  stomach, deceased @  25yr   Hypertension Father    Colon cancer Father 666  Stomach cancer Father    Other Paternal Uncle        intestional cancer   Colon cancer Paternal Uncle [phone]  Stomach cancer Cousin    Colon cancer Cousin 485  Heart attack Cousin    Esophageal cancer Neg Hx     Health Maintenance  Topic Date Due   COVID-19 Vaccine (3 - Moderna risk series) 11/23/2021 (Originally 07/27/2019)   Hepatitis C Screening  08/24/2022 (Originally 08/04/1991)   INFLUENZA VACCINE  10/10/2021   COLONOSCOPY (Pts 45-454yrInsurance coverage will need to be confirmed)  10/11/2024   TETANUS/TDAP  05/02/2026   HIV Screening  Completed   HPV VACCINES  Aged Out      ----------------------------------------------------------------------------------------------------------------------------------------------------------------------------------------------------------------- Physical Exam BP 132/86 (BP Location: Right Arm, Patient Position: Sitting, Cuff Size: Normal)   Pulse 96   Ht '5\' 8"'$  (1.727 m)   Wt 176 lb (79.8 kg)   SpO2 97%   BMI 26.76 kg/m   Physical Exam Constitutional:      Appearance: Normal appearance.  HENT:     Head: Normocephalic and atraumatic.  Eyes:     General: No scleral icterus. Cardiovascular:     Rate and Rhythm: Normal rate and regular rhythm.  Pulmonary:     Effort: Pulmonary effort is normal.     Breath sounds: Normal breath sounds.  Musculoskeletal:     Cervical back: Neck supple.  Neurological:     General: No focal deficit present.     Mental Status: He is alert.  Psychiatric:        Mood and Affect: Mood normal.        Behavior: Behavior normal.     ------------------------------------------------------------------------------------------------------------------------------------------------------------------------------------------------------------------- Assessment and Plan  Essential hypertension Blood pressure remains well controlled.  Continue losartan at current strength.  Hypogonadism in male He feels well with current dose of testosterone.  Recommend continuation.  Updated labs ordered.  Degenerative disc disease, cervical Improved after surgery however continues to have some mild radicular symptoms.  He forgets to take Lyrica 3 times a day.  Switching to 100 mg twice per day.  Midline low back pain without sciatica X-rays completed previously.  He did do physical therapy without much improvement.  MRI ordered.   Meds ordered this encounter  Medications   pregabalin (LYRICA) 100 MG capsule    Sig: 1 capsule PO BID prn    Dispense:  180 capsule    Refill:  0    No follow-ups on  file.    This visit occurred during the SARS-CoV-2 public health emergency.  Safety protocols were in place, including screening questions prior to the visit, additional usage of staff PPE, and extensive cleaning of exam room while observing appropriate contact time as indicated for disinfecting solutions.

## 2021-08-23 NOTE — Assessment & Plan Note (Signed)
He feels well with current dose of testosterone.  Recommend continuation.  Updated labs ordered.

## 2021-08-23 NOTE — Patient Instructions (Signed)
Great to see you today.  Have labs completed 1 week after next testosterone shot.  Have these labs when you are fasting.   You will be called to schedule MRI.

## 2021-08-23 NOTE — Telephone Encounter (Addendum)
Initiated Prior authorization [id number] Cypionate '200MG'$ /ML intramuscular solution Via: Covermymeds Case/Key:[id number]  Status: Denied  as of 08/23/21 Reason:Pt has not tried two preferred formulary alternatives  Notified Pt via: [place]

## 2021-08-23 NOTE — Assessment & Plan Note (Signed)
X-rays completed previously.  He did do physical therapy without much improvement.  MRI ordered.

## 2021-08-23 NOTE — Assessment & Plan Note (Signed)
Blood pressure remains well controlled.  Continue losartan at current strength.

## 2021-08-26 ENCOUNTER — Ambulatory Visit (INDEPENDENT_AMBULATORY_CARE_PROVIDER_SITE_OTHER): Payer: BC Managed Care – PPO

## 2021-08-26 DIAGNOSIS — M5126 Other intervertebral disc displacement, lumbar region: Secondary | ICD-10-CM | POA: Diagnosis not present

## 2021-08-26 DIAGNOSIS — G8929 Other chronic pain: Secondary | ICD-10-CM

## 2021-08-26 DIAGNOSIS — M5136 Other intervertebral disc degeneration, lumbar region: Secondary | ICD-10-CM | POA: Diagnosis not present

## 2021-08-26 DIAGNOSIS — M5127 Other intervertebral disc displacement, lumbosacral region: Secondary | ICD-10-CM | POA: Diagnosis not present

## 2021-08-26 DIAGNOSIS — M545 Low back pain, unspecified: Secondary | ICD-10-CM

## 2021-08-26 DIAGNOSIS — M419 Scoliosis, unspecified: Secondary | ICD-10-CM | POA: Diagnosis not present

## 2021-08-26 IMAGING — MR MR LUMBAR SPINE W/O CM
4 of 5 series · 25 of 48 positions shown · non-contrast
Comparison: Prior CT from [DATE].

CLINICAL DATA: Initial evaluation for lower back pain with
radiculopathy.

EXAM:
MRI LUMBAR SPINE WITHOUT CONTRAST
TECHNIQUE: Multiplanar, multisequence MR imaging of the lumbar spine was
performed. No intravenous contrast was administered.

[Series 2: T2 · sagittal · 4.0mm · 0.81mm/px · 6 of 15 slices shown (1 of 2)]
[im 1/15]
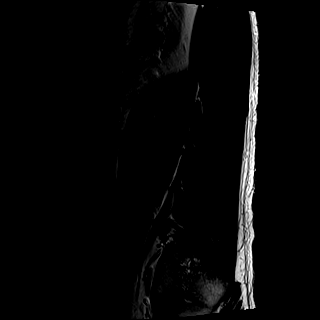
[im 3/15]
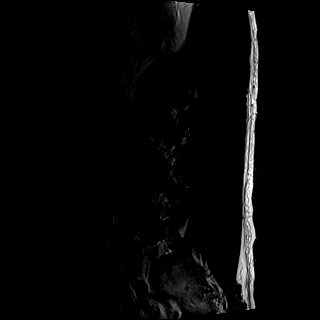
[im 6/15]
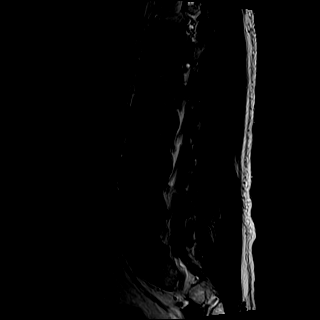
[im 9/15]
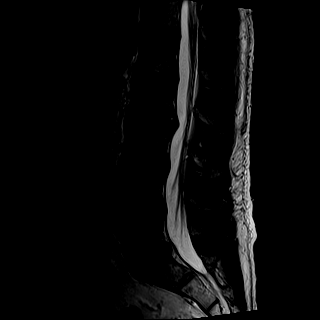
[im 12/15]
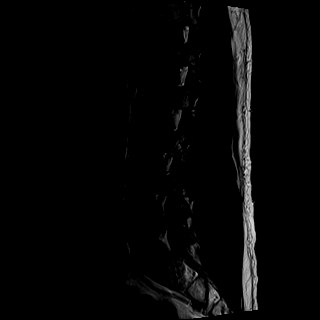
[im 15/15]
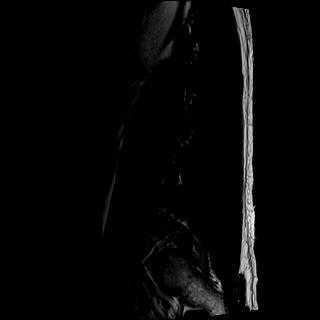

[Series 3: T1 · sagittal · 4.0mm · 0.41mm/px · 6 of 15 slices shown (1 of 2)]
[im 1/15]
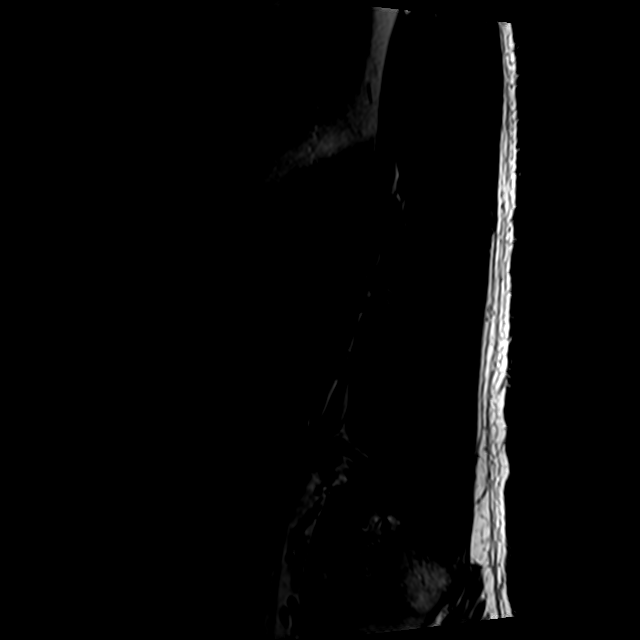
[im 3/15]
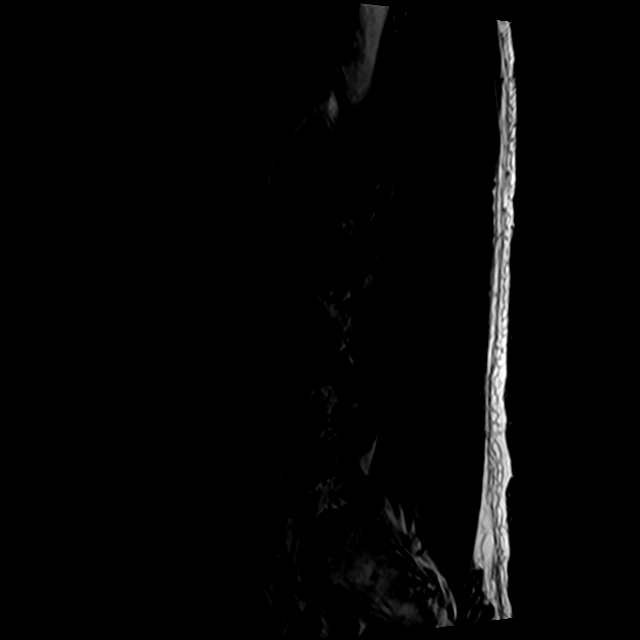
[im 6/15]
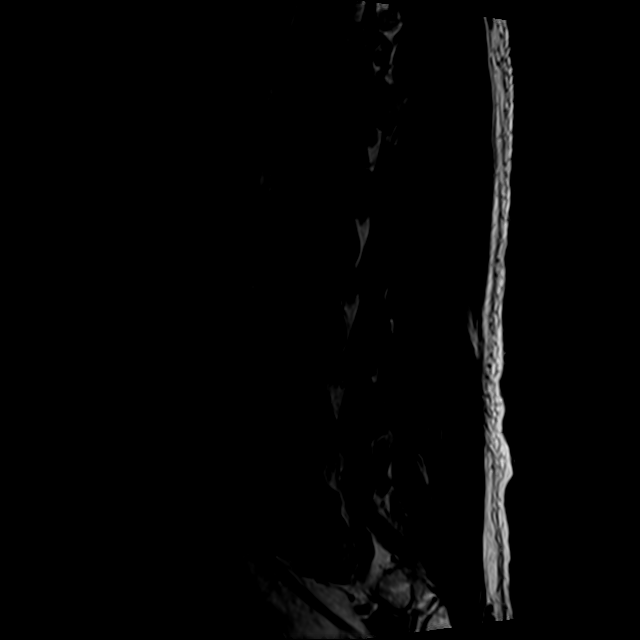
[im 9/15]
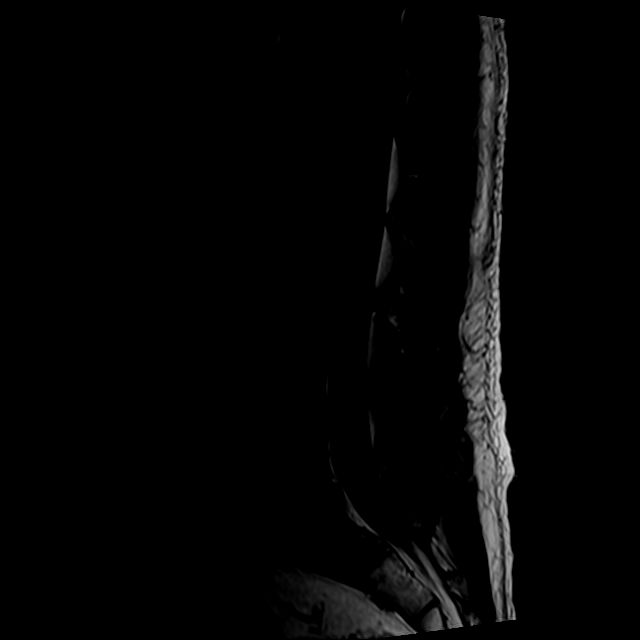
[im 12/15]
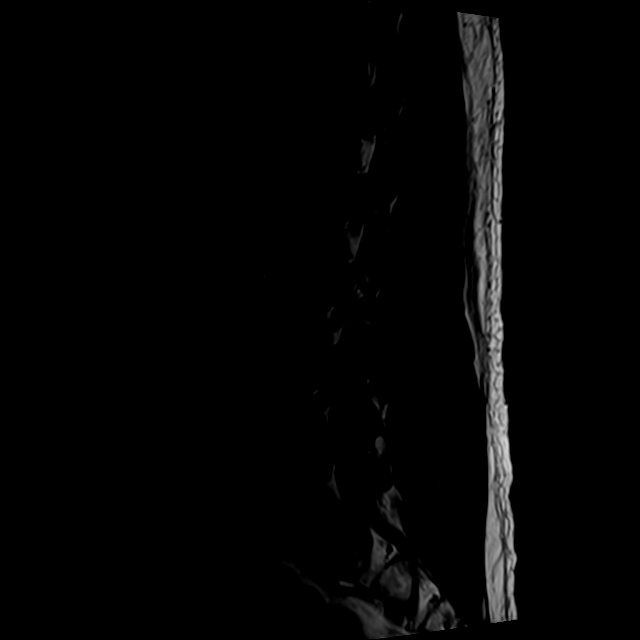
[im 15/15]
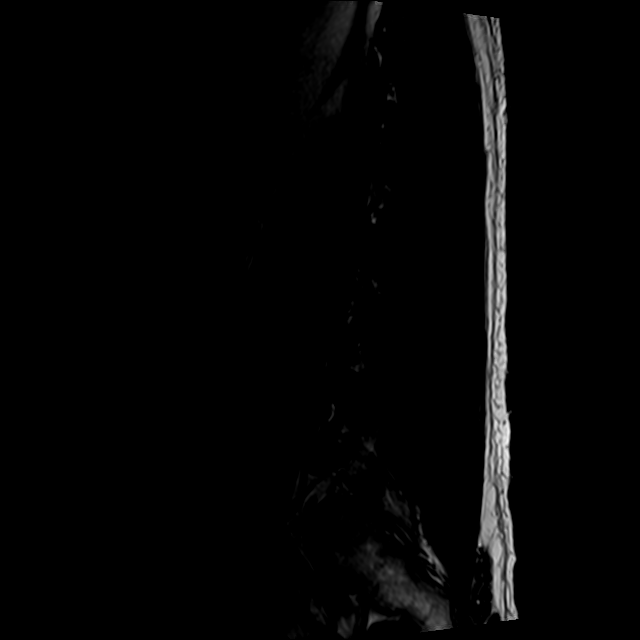

[Series 5: T2 · axial · 4.0mm · 0.78mm/px · z∈[-126,+80]mm · 9 of 39 slices shown (2 of 2)]
[im 1/39]
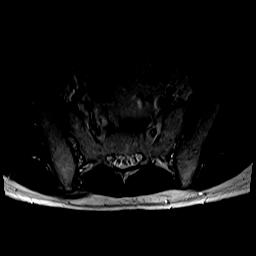
[im 6/39]
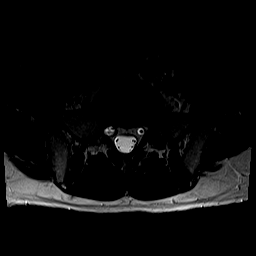
[im 11/39]
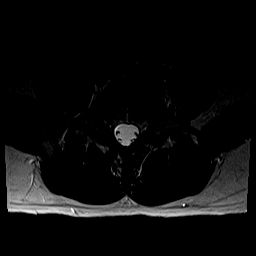
[im 17/39]
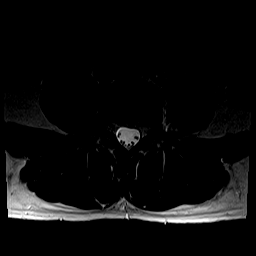
[im 20/39]
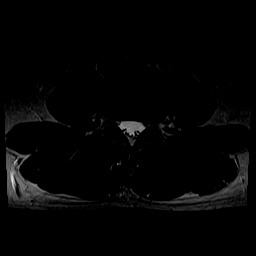
[im 22/39]
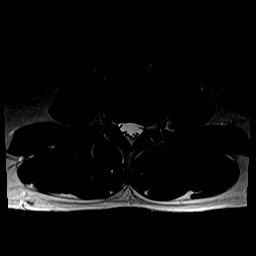
[im 28/39]
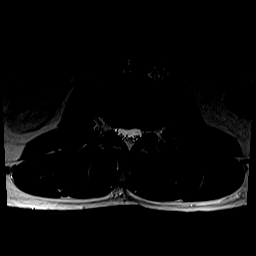
[im 33/39]
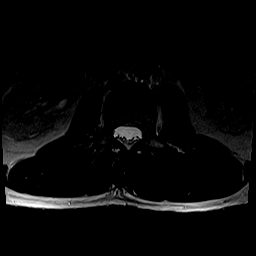
[im 39/39]
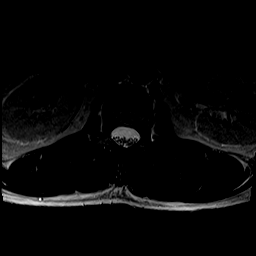

[Series 6: T1 · axial · 4.0mm · 0.39mm/px · z∈[-126,+50]mm · 4 of 39 slices shown (2 of 2)]
[im 1/39]
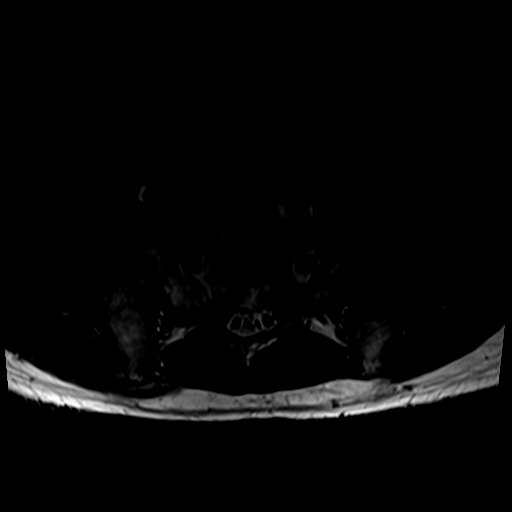
[im 6/39]
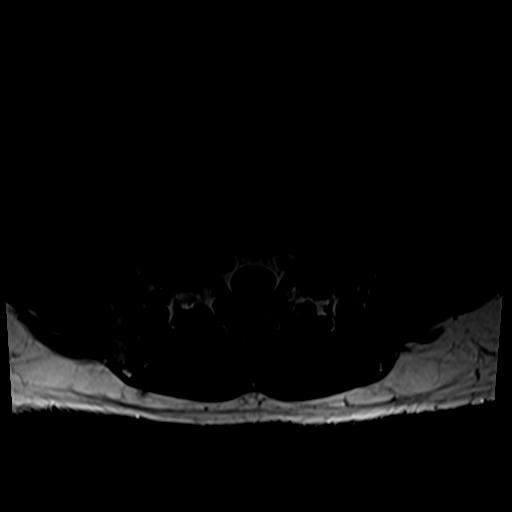
[im 20/39]
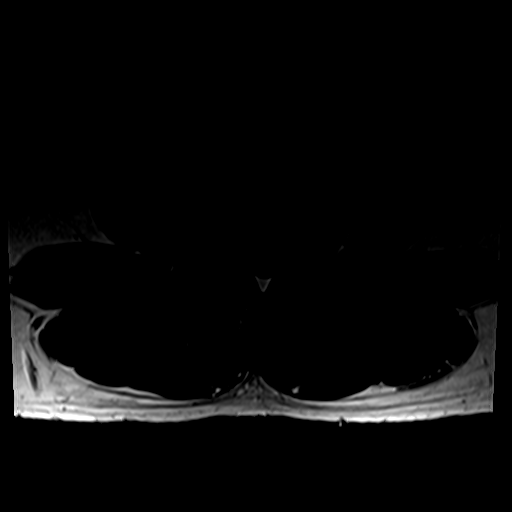
[im 33/39]
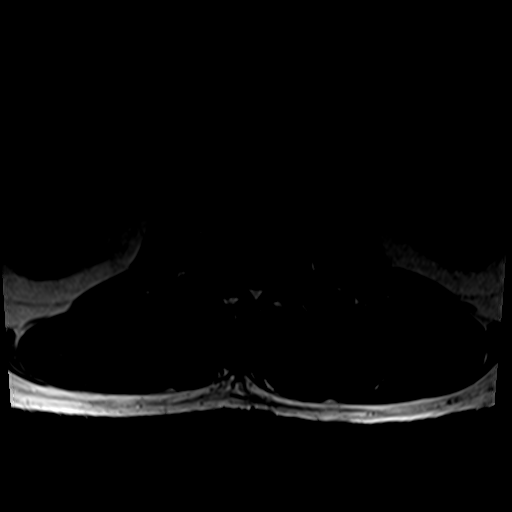

[25 of 48 positions shown; findings below may reference images not displayed]

FINDINGS: Segmentation: Standard. Lowest well-formed disc space labeled the
L5-S1 level.

Alignment: Mild levoscoliosis with straightening of the normal
lumbar lordosis. No significant listhesis.

Vertebrae: Vertebral body height maintained without acute or chronic
fracture. Bone marrow signal intensity within normal limits. No
worrisome osseous lesions. No abnormal marrow edema.

Conus medullaris and cauda equina: Conus extends to the L1 level.
Conus and cauda equina appear normal.

Paraspinal and other soft tissues: Paraspinous soft tissues within
normal limits. Small T2 hyperintense cyst noted within the
interpolar right kidney, benign in appearance, no follow-up imaging
recommended. Visualized visceral structures otherwise unremarkable.

Disc levels:

L1-2:  Unremarkable.

L2-3: Degenerative intervertebral disc space narrowing with diffuse
disc bulge and disc desiccation. Superimposed small right central
disc protrusion minimally indents the ventral thecal sac (series 5,
image 11). Associated mild endplate and facet spurring. No spinal
stenosis. Foramina remain patent.

L3-4: Mild far lateral disc bulge with endplate spurring. No spinal
stenosis. Foramina remain patent.

L4-5: Negative interspace. Mild facet hypertrophy. No spinal
stenosis. Foramina remain patent.

L5-S1: Disc desiccation with mild disc bulge. Mild facet
hypertrophy. No spinal stenosis. Foramina remain patent.
IMPRESSION: 1. Small right central disc protrusion at L2-3 without stenosis or
neural impingement.
2. Additional mild noncompressive disc bulging at L3-4 and L5-S1
without stenosis or neural impingement.
3. Underlying mild levoscoliosis with multilevel facet hypertrophy
as above.

## 2021-09-04 ENCOUNTER — Encounter: Payer: Self-pay | Admitting: Family Medicine

## 2021-09-04 DIAGNOSIS — G8929 Other chronic pain: Secondary | ICD-10-CM

## 2021-09-11 DIAGNOSIS — I10 Essential (primary) hypertension: Secondary | ICD-10-CM | POA: Diagnosis not present

## 2021-09-11 DIAGNOSIS — E291 Testicular hypofunction: Secondary | ICD-10-CM | POA: Diagnosis not present

## 2021-09-11 DIAGNOSIS — R7303 Prediabetes: Secondary | ICD-10-CM | POA: Diagnosis not present

## 2021-09-11 DIAGNOSIS — Z13220 Encounter for screening for lipoid disorders: Secondary | ICD-10-CM | POA: Diagnosis not present

## 2021-09-12 LAB — COMPLETE METABOLIC PANEL WITH GFR
AG Ratio: 1.6 (calc) (ref 1.0–2.5)
ALT: 21 U/L (ref 9–46)
AST: 12 U/L (ref 10–40)
Albumin: 4.5 g/dL (ref 3.6–5.1)
Alkaline phosphatase (APISO): 61 U/L (ref 36–130)
BUN: 9 mg/dL (ref 7–25)
CO2: 28 mmol/L (ref 20–32)
Calcium: 9.6 mg/dL (ref 8.6–10.3)
Chloride: 105 mmol/L (ref 98–110)
Creat: 1 mg/dL (ref 0.60–1.29)
Globulin: 2.8 g/dL (calc) (ref 1.9–3.7)
Glucose, Bld: 110 mg/dL — ABNORMAL HIGH (ref 65–99)
Potassium: 4.7 mmol/L (ref 3.5–5.3)
Sodium: 141 mmol/L (ref 135–146)
Total Bilirubin: 0.6 mg/dL (ref 0.2–1.2)
Total Protein: 7.3 g/dL (ref 6.1–8.1)
eGFR: 93 mL/min/{1.73_m2} (ref >=60)

## 2021-09-12 LAB — HEMOGLOBIN A1C
Hgb A1c MFr Bld: 5.7 % of total Hgb — ABNORMAL HIGH (ref <5.7)
Mean Plasma Glucose: 117 mg/dL
eAG (mmol/L): 6.5 mmol/L

## 2021-09-12 LAB — LIPID PANEL W/REFLEX DIRECT LDL
Cholesterol: 234 mg/dL — ABNORMAL HIGH (ref <200)
HDL: 45 mg/dL (ref >=40)
LDL Cholesterol (Calc): 169 mg/dL (calc) — ABNORMAL HIGH
Non-HDL Cholesterol (Calc): 189 mg/dL (calc) — ABNORMAL HIGH (ref <130)
Total CHOL/HDL Ratio: 5.2 (calc) — ABNORMAL HIGH (ref <5.0)
Triglycerides: 90 mg/dL (ref <150)

## 2021-09-12 LAB — CBC WITH DIFFERENTIAL/PLATELET
Absolute Monocytes: 328 cells/uL (ref 200–950)
Basophils Absolute: 20 cells/uL (ref 0–200)
Basophils Relative: 0.5 %
Eosinophils Absolute: 88 cells/uL (ref 15–500)
Eosinophils Relative: 2.2 %
HCT: 46.2 % (ref 38.5–50.0)
Hemoglobin: 15.4 g/dL (ref 13.2–17.1)
Lymphs Abs: 1152 cells/uL (ref 850–3900)
MCH: 28.8 pg (ref 27.0–33.0)
MCHC: 33.3 g/dL (ref 32.0–36.0)
MCV: 86.4 fL (ref 80.0–100.0)
MPV: 9 fL (ref 7.5–12.5)
Monocytes Relative: 8.2 %
Neutro Abs: 2412 cells/uL (ref 1500–7800)
Neutrophils Relative %: 60.3 %
Platelets: 444 10*3/uL — ABNORMAL HIGH (ref 140–400)
RBC: 5.35 10*6/uL (ref 4.20–5.80)
RDW: 13.9 % (ref 11.0–15.0)
Total Lymphocyte: 28.8 %
WBC: 4 10*3/uL (ref 3.8–10.8)

## 2021-09-12 LAB — PSA: PSA: 2.03 ng/mL (ref <=4.00)

## 2021-09-12 LAB — TESTOSTERONE: Testosterone: 406 ng/dL (ref 250–827)

## 2021-09-20 ENCOUNTER — Ambulatory Visit: Payer: BC Managed Care – PPO | Attending: Family Medicine | Admitting: Rehabilitative and Restorative Service Providers"

## 2021-09-20 ENCOUNTER — Other Ambulatory Visit: Payer: Self-pay

## 2021-09-20 ENCOUNTER — Encounter: Payer: Self-pay | Admitting: Rehabilitative and Restorative Service Providers"

## 2021-09-20 DIAGNOSIS — M2560 Stiffness of unspecified joint, not elsewhere classified: Secondary | ICD-10-CM

## 2021-09-20 DIAGNOSIS — R293 Abnormal posture: Secondary | ICD-10-CM

## 2021-09-20 DIAGNOSIS — M6281 Muscle weakness (generalized): Secondary | ICD-10-CM

## 2021-09-20 DIAGNOSIS — G8929 Other chronic pain: Secondary | ICD-10-CM | POA: Diagnosis not present

## 2021-09-20 DIAGNOSIS — M5440 Lumbago with sciatica, unspecified side: Secondary | ICD-10-CM | POA: Diagnosis not present

## 2021-09-20 DIAGNOSIS — M545 Low back pain, unspecified: Secondary | ICD-10-CM

## 2021-09-20 DIAGNOSIS — R29898 Other symptoms and signs involving the musculoskeletal system: Secondary | ICD-10-CM

## 2021-09-20 NOTE — Patient Instructions (Signed)
Access Code: [email or web address] URL: [email or web address] Date: 09/20/2021 Prepared by: Gillermo Murdoch  Exercises - Prone Press Up  - 2 x daily - 7 x weekly - 1 sets - 10 reps - 2-3 sec  hold - Standing Lumbar Extension  - 2 x daily - 7 x weekly - 1 sets - 2-3 reps - 2-3 sec  hold - Hooklying Hamstring Stretch with Strap  - 2 x daily - 7 x weekly - 1 sets - 3 reps - 30 sec  hold - Supine Piriformis Stretch with Leg Straight  - 2 x daily - 7 x weekly - 1 sets - 3 reps - 30 sec  hold - Hip Flexor Stretch at Edge of Bed  - 2 x daily - 7 x weekly - 1 sets - 3 reps - 30 sec  hold  Patient Education - Biomedical scientist - Office Posture - Trigger Point Dry Needling
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
## 2021-09-23 ENCOUNTER — Other Ambulatory Visit: Payer: Self-pay | Admitting: Family Medicine

## 2021-09-27 ENCOUNTER — Encounter: Payer: Self-pay | Admitting: Rehabilitative and Restorative Service Providers"

## 2021-09-27 ENCOUNTER — Ambulatory Visit: Payer: BC Managed Care – PPO | Admitting: Rehabilitative and Restorative Service Providers"

## 2021-09-27 DIAGNOSIS — R29898 Other symptoms and signs involving the musculoskeletal system: Secondary | ICD-10-CM

## 2021-09-27 DIAGNOSIS — M6281 Muscle weakness (generalized): Secondary | ICD-10-CM

## 2021-09-27 DIAGNOSIS — M545 Low back pain, unspecified: Secondary | ICD-10-CM

## 2021-09-27 DIAGNOSIS — R293 Abnormal posture: Secondary | ICD-10-CM

## 2021-09-27 DIAGNOSIS — M2560 Stiffness of unspecified joint, not elsewhere classified: Secondary | ICD-10-CM

## 2021-09-27 DIAGNOSIS — G8929 Other chronic pain: Secondary | ICD-10-CM | POA: Diagnosis not present

## 2021-09-27 DIAGNOSIS — M542 Cervicalgia: Secondary | ICD-10-CM

## 2021-09-27 DIAGNOSIS — M5440 Lumbago with sciatica, unspecified side: Secondary | ICD-10-CM | POA: Diagnosis not present

## 2021-09-27 NOTE — Therapy (Addendum)
OUTPATIENT PHYSICAL THERAPY TREATMENT NOTE   Patient Name: Justin Ashley MRN: [id number] DOB:09/30/1975, 46 y.o., male Today's Date: 09/27/2021  PCP: Dr Luetta Nutting REFERRING PROVIDER: Dr Luetta Nutting   PT End of Session - 09/27/21 1456     Visit Number 2    Number of Visits 16    Date for PT Re-Evaluation 11/15/21    PT Start Time 1450    PT Stop Time 1540    PT Time Calculation (min) 50 min    Activity Tolerance Patient tolerated treatment well             Past Medical History:  Diagnosis Date   Dislocation of metatarsophalangeal joint of right great toe    Erectile dysfunction 12/24/2012   BCBS will not cover Cialis    Essential hypertension 06/30/2014   GERD (gastroesophageal reflux disease)    Headache    History of kidney stones    History of thrombocytosis 03/09/2013   Hypogonadism in male 12/15/2013   Recheck therapy January 2016    Kidney stone    Pneumonia age 71   Pre-diabetes    Sleep apnea    self report   Suicide attempt ([place]) 12/10/2013   Past Surgical History:  Procedure Laterality Date   ANTERIOR CERVICAL DECOMP/DISCECTOMY FUSION N/A 09/08/2020   Procedure: ANTERIOR CERVICAL DECOMPRESSION FUSION CERVICAL 4- CERVICAL5 , CERVICAL 5 - CERVICAL 6 WITH INSTRUMENTATION AND ALLOGRAFT;  Surgeon: Phylliss Bob, MD;  Location: [facility];  Service: Orthopedics;  Laterality: N/A;   DENTAL SURGERY Left 2018   Has metal maybe titanium   NO PAST SURGERIES     Patient Active Problem List   Diagnosis Date Noted   Atopic dermatitis 02/14/2021   Iron deficiency anemia 01/06/2020   Midline low back pain without sciatica 07/09/2019   Prediabetes 01/09/2019   Migraine 09/09/2018   H. pylori infection 05/03/2016   Dyspepsia 05/02/2016   Palpitations 11/28/2015   Panic attacks 06/14/2015   Nicotine vapor product user 10/11/2014   Essential hypertension 06/30/2014   Hypogonadism in male 12/15/2013   History of suicide attempt 12/10/2013   Degenerative disc  disease, cervical 03/09/2013   History of thrombocytosis 03/09/2013   Erectile dysfunction 12/24/2012    REFERRING DIAG: Chronic mid and low back pain   THERAPY DIAG:  Chronic bilateral low back pain without sciatica  Abnormal posture  Muscle weakness (generalized)  Other symptoms and signs involving the musculoskeletal system  Stiffness due to immobility  Cervicalgia  Rationale for Evaluation and Treatment Rehabilitation  PERTINENT HISTORY:  cervical disc surgery ~ 13 months ago with some improvement - pain now 1-2/10; arthritis; HTN; cyst on Rt kidney   PRECAUTIONS: Cervical fusion   SUBJECTIVE: Patient reports that he did some of the exercises at home Monday but has not done any yesterday or today. Work does not lend itself to exercises. (Discussed ways to incorporate exercises into work day.)   PAIN:  Are you having pain? Yes: NPRS scale: 4/10 Pain location: mid to low back  Pain description: nagging  Aggravating factors: standing; standing after lying down  Relieving factors: lying down     TODAY'S TREATMENT:  UBE - L3 x 4 min alt fwd/back  Doorway stretch 3 positions 30 sec hold x 2 reps each position  Standing trunk extension 5 sec x 3 reps  Hamstring stretch supine 30 sec x 2 reps with strap  Piriformis stretch supine travell 30 sec x 2 reps each side with strap  Prone press up  3 sec hold x 10 reps   Added: Transverse abdominals - 4 part core 10 sec hold x 10 reps  Hamstring stretch - Sitting 30 sec hold x 1 rep each side  Hip flexor stretch sitting 30 sec hold x 1 rep each side  Supine T on 4 inch foam roll ~ 2 min     Manual - spinal mobs lumbar to thoracic PA and lateral mobs Grade II/III    PATIENT EDUCATION: Education details: HEP Person educated: Patient Education method: Consulting civil engineer, Media planner, Corporate treasurer cues, Verbal cues, and Handouts Education comprehension: verbalized understanding, returned demonstration, verbal cues required, tactile  cues required, and needs further education   HOME EXERCISE PROGRAM: [email or web address] Exercises  Posture and body mechanics Office ergonomics DN      PT Long Term Goals -        PT LONG TERM GOAL #1   Title Improve posture and alignment with patient to demonstrate improved upright posture with posterior shoudler girdle engaged    Time 8    Status New    Target Date 11/15/21      PT LONG TERM GOAL #2   Title Patient to report decrease of mid and low back pain 50-70% allowing him to stand and walk at work with less pain and greater ease    Baseline -    Time 8    Period Weeks    Status New    Target Date 11/15/21      PT LONG TERM GOAL #3   Title Increase trunk flexion to 70% with minimal to no pain    Baseline -    Time 8    Period Weeks    Status New    Target Date 11/15/21      PT LONG TERM GOAL #4   Title Independent in HEP    Time 8    Status New    Target Date 11/15/21      PT LONG TERM GOAL #5   Title Improve functional limitation score to 56    Time 8    Period Weeks    Status New    Target Date 11/15/21            Access Code: [email or web address] URL: [email or web address] Date: 09/27/2021 Prepared by: Gillermo Murdoch  Exercises - Prone Press Up  - 2 x daily - 7 x weekly - 1 sets - 10 reps - 2-3 sec  hold - Standing Lumbar Extension  - 2 x daily - 7 x weekly - 1 sets - 2-3 reps - 2-3 sec  hold - Hooklying Hamstring Stretch with Strap  - 2 x daily - 7 x weekly - 1 sets - 3 reps - 30 sec  hold - Supine Piriformis Stretch with Leg Straight  - 2 x daily - 7 x weekly - 1 sets - 3 reps - 30 sec  hold - Hip Flexor Stretch at Edge of Bed  - 2 x daily - 7 x weekly - 1 sets - 3 reps - 30 sec  hold - Supine Transversus Abdominis Bracing with Pelvic Floor Contraction  - 2 x daily - 7 x weekly - 1 sets - 10 reps - 10sec  hold - Seated Hamstring Stretch  - 2 x daily - 7 x weekly - 1 sets - 3 reps - 30 sec  hold - Seated Hip Flexor Stretch  - 2 x daily - 7 x weekly -  1 sets - 3 reps - 30 sec  hold - Doorway Pec Stretch at 60 Degrees Abduction  - 3 x daily - 7 x weekly - 1 sets - 3 reps - Doorway Pec Stretch at 90 Degrees Abduction  - 3 x daily - 7 x weekly - 1 sets - 3 reps - 30 seconds  hold - Doorway Pec Stretch at 120 Degrees Abduction  - 3 x daily - 7 x weekly - 1 sets - 3 reps - 30 second hold  hold  Plan     Clinical Impression Statement Patient has been unable to exercise consistently since appt last week. We discussed ways to add exercise to work day and fit them into his schedule. Reviewed and corrected exercises for home. Added exercises without difficulty. Patient was encouraged to work on exercises. Will add core strengthening at next visit.    Stability/Clinical Decision Making Stable/Uncomplicated    Rehab Potential Good    PT Frequency 2x / week    PT Duration 8 weeks    PT Treatment/Interventions ADLs/Self Care Home Management;Aquatic Therapy;Cryotherapy;Electrical Stimulation;Iontophoresis '4mg'$ /ml Dexamethasone;Moist Heat;Ultrasound;Functional mobility training;Therapeutic activities;Therapeutic exercise;Balance training;Neuromuscular re-education;Patient/family education;Manual techniques;Dry needling;Taping    PT Next Visit Plan review and progress exercise; focus on spinal mobility in all planes - responded well to extension today; stretching for spine and bilat hips; core strengthening and stabilization; has TENS unit for home use    PT Home Exercise Plan [email or web address]    Consulted and Agree with Plan of Care Patient               Everardo All, PT, MPH  09/27/2021, 4:28 PM

## 2021-10-03 ENCOUNTER — Ambulatory Visit: Payer: BC Managed Care – PPO | Admitting: Rehabilitative and Restorative Service Providers"

## 2021-10-05 ENCOUNTER — Encounter: Payer: Self-pay | Admitting: Rehabilitative and Restorative Service Providers"

## 2021-10-05 ENCOUNTER — Ambulatory Visit: Payer: BC Managed Care – PPO | Admitting: Rehabilitative and Restorative Service Providers"

## 2021-10-05 DIAGNOSIS — M2560 Stiffness of unspecified joint, not elsewhere classified: Secondary | ICD-10-CM

## 2021-10-05 DIAGNOSIS — G8929 Other chronic pain: Secondary | ICD-10-CM | POA: Diagnosis not present

## 2021-10-05 DIAGNOSIS — R293 Abnormal posture: Secondary | ICD-10-CM

## 2021-10-05 DIAGNOSIS — M6281 Muscle weakness (generalized): Secondary | ICD-10-CM

## 2021-10-05 DIAGNOSIS — M5440 Lumbago with sciatica, unspecified side: Secondary | ICD-10-CM | POA: Diagnosis not present

## 2021-10-05 DIAGNOSIS — R29898 Other symptoms and signs involving the musculoskeletal system: Secondary | ICD-10-CM

## 2021-10-05 NOTE — Therapy (Signed)
OUTPATIENT PHYSICAL THERAPY TREATMENT NOTE   Patient Name: Justin Ashley MRN: [id number] DOB:[birth year], 46 y.o., male Today's Date: 10/05/2021  PCP: Dr Luetta Nutting REFERRING PROVIDER: Dr Luetta Nutting   PT End of Session - 10/05/21 1537     Visit Number 3    Number of Visits 16    Date for PT Re-Evaluation 11/15/21    PT Start Time 5809    PT Stop Time 9833    PT Time Calculation (min) 45 min    Activity Tolerance Patient tolerated treatment well             Past Medical History:  Diagnosis Date   Dislocation of metatarsophalangeal joint of right great toe    Erectile dysfunction 12/24/2012   BCBS will not cover Cialis    Essential hypertension 06/30/2014   GERD (gastroesophageal reflux disease)    Headache    History of kidney stones    History of thrombocytosis 03/09/2013   Hypogonadism in male 12/15/2013   Recheck therapy January 2016    Kidney stone    Pneumonia age 78   Pre-diabetes    Sleep apnea    self report   Suicide attempt ([place]) 12/10/2013   Past Surgical History:  Procedure Laterality Date   ANTERIOR CERVICAL DECOMP/DISCECTOMY FUSION N/A 09/08/2020   Procedure: ANTERIOR CERVICAL DECOMPRESSION FUSION CERVICAL 4- CERVICAL5 , CERVICAL 5 - CERVICAL 6 WITH INSTRUMENTATION AND ALLOGRAFT;  Surgeon: Phylliss Bob, MD;  Location: [facility];  Service: Orthopedics;  Laterality: N/A;   DENTAL SURGERY Left 2018   Has metal maybe titanium   NO PAST SURGERIES     Patient Active Problem List   Diagnosis Date Noted   Atopic dermatitis 02/14/2021   Iron deficiency anemia 01/06/2020   Midline low back pain without sciatica 07/09/2019   Prediabetes 01/09/2019   Migraine 09/09/2018   H. pylori infection 05/03/2016   Dyspepsia 05/02/2016   Palpitations 11/28/2015   Panic attacks 06/14/2015   Nicotine vapor product user 10/11/2014   Essential hypertension 06/30/2014   Hypogonadism in male 12/15/2013   History of suicide attempt 12/10/2013   Degenerative disc  disease, cervical 03/09/2013   History of thrombocytosis 03/09/2013   Erectile dysfunction 12/24/2012    REFERRING DIAG: Chronic mid and low back pain   THERAPY DIAG:  Chronic bilateral low back pain without sciatica  Abnormal posture  Muscle weakness (generalized)  Other symptoms and signs involving the musculoskeletal system  Stiffness due to immobility  Rationale for Evaluation and Treatment Rehabilitation  PERTINENT HISTORY:  cervical disc surgery ~ 13 months ago with some improvement - pain now 1-2/10; arthritis; HTN; cyst on Rt kidney   PRECAUTIONS: Cervical fusion   SUBJECTIVE: Patient reports that he helped an electrician at his house yesterday and has flared up his LBP and his neck as well. He has not done any of the exercises at home.   PAIN:  Are you having pain? Yes: NPRS scale: 7/10 Pain location: mid to low back  Pain description: aching  Aggravating factors: standing; standing after lying down  Relieving factors: lying down     TODAY'S TREATMENT:  Nustep L6 x 5 min U/LE's   Spinal decompression exercises supine shd extension, single leg extension opposite bent, chest lift, marching in hooklying 10 sec hold x 10 reps each  side  Hip flexor stretch thomas position 30 sec x 2 reps ea side  Gentle trunk rotation in hooklying 10 sec x 3 reps ea side Doorway stretch 3 positions 30  sec hold x 2 reps ea position  Standing trunk extension 5 sec x 3 reps  Hamstring stretch supine 30 sec x 2 reps with strap  Piriformis stretch supine travell 30 sec x 2 reps each side with strap  Prone press up 3 sec hold x 10 reps  Diaphragmatic breathing count of 6 x 5 min  Added 09/28/22 visit - unable to perform 10/05/21: Transverse abdominals - 4 part core 10 sec hold x 10 reps  Hamstring stretch - Sitting 30 sec hold x 1 rep each side  Hip flexor stretch sitting 30 sec hold x 1 rep each side  Supine T on 4 inch foam roll ~ 2 min  Modalities: Moist heat x 10 min cervical  and lumbar spine    Manual - spinal mobs lumbar to thoracic PA and lateral mobs Grade II/III    PATIENT EDUCATION: Education details: HEP Person educated: Patient Education method: Consulting civil engineer, Media planner, Corporate treasurer cues, Verbal cues, and Handouts Education comprehension: verbalized understanding, returned demonstration, verbal cues required, tactile cues required, and needs further education   HOME EXERCISE PROGRAM: [email or web address] Exercises  Posture and body mechanics Office ergonomics DN      PT Long Term Goals -        PT LONG TERM GOAL #1   Title Improve posture and alignment with patient to demonstrate improved upright posture with posterior shoudler girdle engaged    Time 8    Status New    Target Date 11/15/21      PT LONG TERM GOAL #2   Title Patient to report decrease of mid and low back pain 50-70% allowing him to stand and walk at work with less pain and greater ease    Baseline -    Time 8    Period Weeks    Status New    Target Date 11/15/21      PT LONG TERM GOAL #3   Title Increase trunk flexion to 70% with minimal to no pain    Baseline -    Time 8    Period Weeks    Status New    Target Date 11/15/21      PT LONG TERM GOAL #4   Title Independent in HEP    Time 8    Status New    Target Date 11/15/21      PT LONG TERM GOAL #5   Title Improve functional limitation score to 56    Time 8    Period Weeks    Status New    Target Date 11/15/21           Access Code: [email or web address] URL: [email or web address] Date: 09/27/2021 Prepared by: Gillermo Murdoch  Exercises - Prone Press Up  - 2 x daily - 7 x weekly - 1 sets - 10 reps - 2-3 sec  hold - Standing Lumbar Extension  - 2 x daily - 7 x weekly - 1 sets - 2-3 reps - 2-3 sec  hold - Hooklying Hamstring Stretch with Strap  - 2 x daily - 7 x weekly - 1 sets - 3 reps - 30 sec  hold - Supine Piriformis Stretch with Leg Straight  - 2 x daily - 7 x weekly - 1 sets - 3 reps - 30 sec  hold - Hip  Flexor Stretch at Edge of Bed  - 2 x daily - 7 x weekly - 1 sets - 3 reps - 30 sec  hold - Supine Transversus Abdominis Bracing with Pelvic  Floor Contraction  - 2 x daily - 7 x weekly - 1 sets - 10 reps - 10sec  hold - Seated Hamstring Stretch  - 2 x daily - 7 x weekly - 1 sets - 3 reps - 30 sec  hold - Seated Hip Flexor Stretch  - 2 x daily - 7 x weekly - 1 sets - 3 reps - 30 sec  hold - Doorway Pec Stretch at 60 Degrees Abduction  - 3 x daily - 7 x weekly - 1 sets - 3 reps - Doorway Pec Stretch at 90 Degrees Abduction  - 3 x daily - 7 x weekly - 1 sets - 3 reps - 30 seconds  hold - Doorway Pec Stretch at 120 Degrees Abduction  - 3 x daily - 7 x weekly - 1 sets - 3 reps - 30 second hold  hold    Plan - 10/05/21 1544     Clinical Impression Statement Patient presents with flare up of LBP and cervical spine from activities helping an electrician yesterday. Modified exercises today to help decrease pain and introduce gentle movements.    Rehab Potential Good    PT Frequency 2x / week    PT Duration 8 weeks    PT Treatment/Interventions ADLs/Self Care Home Management;Aquatic Therapy;Cryotherapy;Electrical Stimulation;Iontophoresis '4mg'$ /ml Dexamethasone;Moist Heat;Ultrasound;Functional mobility training;Therapeutic activities;Therapeutic exercise;Balance training;Neuromuscular re-education;Patient/family education;Manual techniques;Dry needling;Taping    PT Next Visit Plan review and progress exercise; focus on spinal mobility in all planes - responded well to extension today; stretching for spine and bilat hips; core strengthening and stabilization; has TENS unit for home use    PT Home Exercise Plan [email or web address]    Consulted and Agree with Plan of Care Patient                         Everardo All, PT, MPH  10/05/2021, 3:54 PM

## 2021-10-12 ENCOUNTER — Ambulatory Visit: Payer: BC Managed Care – PPO | Attending: Family Medicine | Admitting: Rehabilitative and Restorative Service Providers"

## 2021-10-12 ENCOUNTER — Other Ambulatory Visit: Payer: Self-pay

## 2021-10-12 ENCOUNTER — Encounter: Payer: Self-pay | Admitting: Rehabilitative and Restorative Service Providers"

## 2021-10-12 DIAGNOSIS — Z7409 Other reduced mobility: Secondary | ICD-10-CM | POA: Diagnosis not present

## 2021-10-12 DIAGNOSIS — R29898 Other symptoms and signs involving the musculoskeletal system: Secondary | ICD-10-CM | POA: Diagnosis not present

## 2021-10-12 DIAGNOSIS — M2560 Stiffness of unspecified joint, not elsewhere classified: Secondary | ICD-10-CM | POA: Insufficient documentation

## 2021-10-12 DIAGNOSIS — M6281 Muscle weakness (generalized): Secondary | ICD-10-CM | POA: Diagnosis not present

## 2021-10-12 DIAGNOSIS — M545 Low back pain, unspecified: Secondary | ICD-10-CM | POA: Insufficient documentation

## 2021-10-12 DIAGNOSIS — R293 Abnormal posture: Secondary | ICD-10-CM | POA: Diagnosis not present

## 2021-10-12 DIAGNOSIS — G8929 Other chronic pain: Secondary | ICD-10-CM | POA: Diagnosis not present

## 2021-10-12 NOTE — Therapy (Signed)
OUTPATIENT PHYSICAL THERAPY TREATMENT NOTE   Patient Name: Justin Ashley MRN: [id number] DOB:[birth year], 46 y.o., male Today's Date: 10/12/2021  PCP: Dr Luetta Nutting REFERRING PROVIDER: Dr Luetta Nutting   PT End of Session - 10/12/21 1536     Visit Number 4    Number of Visits 16    Date for PT Re-Evaluation 11/15/21    PT Start Time 1941    PT Stop Time 1618    PT Time Calculation (min) 48 min    Activity Tolerance Patient tolerated treatment well             Past Medical History:  Diagnosis Date   Dislocation of metatarsophalangeal joint of right great toe    Erectile dysfunction 12/24/2012   BCBS will not cover Cialis    Essential hypertension 06/30/2014   GERD (gastroesophageal reflux disease)    Headache    History of kidney stones    History of thrombocytosis 03/09/2013   Hypogonadism in male 12/15/2013   Recheck therapy January 2016    Kidney stone    Pneumonia age 16   Pre-diabetes    Sleep apnea    self report   Suicide attempt ([place]) 12/10/2013   Past Surgical History:  Procedure Laterality Date   ANTERIOR CERVICAL DECOMP/DISCECTOMY FUSION N/A 09/08/2020   Procedure: ANTERIOR CERVICAL DECOMPRESSION FUSION CERVICAL 4- CERVICAL5 , CERVICAL 5 - CERVICAL 6 WITH INSTRUMENTATION AND ALLOGRAFT;  Surgeon: Phylliss Bob, MD;  Location: [place];  Service: Orthopedics;  Laterality: N/A;   DENTAL SURGERY Left 2018   Has metal maybe titanium   NO PAST SURGERIES     Patient Active Problem List   Diagnosis Date Noted   Atopic dermatitis 02/14/2021   Iron deficiency anemia 01/06/2020   Midline low back pain without sciatica 07/09/2019   Prediabetes 01/09/2019   Migraine 09/09/2018   H. pylori infection 05/03/2016   Dyspepsia 05/02/2016   Palpitations 11/28/2015   Panic attacks 06/14/2015   Nicotine vapor product user 10/11/2014   Essential hypertension 06/30/2014   Hypogonadism in male 12/15/2013   History of suicide attempt 12/10/2013   Degenerative disc  disease, cervical 03/09/2013   History of thrombocytosis 03/09/2013   Erectile dysfunction 12/24/2012    REFERRING DIAG: Chronic mid and low back pain   THERAPY DIAG:  Chronic bilateral low back pain without sciatica  Abnormal posture  Muscle weakness (generalized)  Other symptoms and signs involving the musculoskeletal system  Stiffness due to immobility  Rationale for Evaluation and Treatment Rehabilitation  PERTINENT HISTORY:  cervical disc surgery ~ 13 months ago with some improvement - pain now 1-2/10; arthritis; HTN; cyst on Rt kidney   PRECAUTIONS: Cervical fusion   SUBJECTIVE: Patient reports that he helped an electrician at his house yesterday and has flared up his LBP and his neck as well. He has not done any of the exercises at home.   PAIN:  Are you having pain? Yes: NPRS scale: 5/10 Pain location: mid to low back  Pain description: discomfort; aching  Aggravating factors: standing; standing after lying down  Relieving factors: lying down     TODAY'S TREATMENT:  Nustep L5 x 3 min, L6 x 3 min U/LE's   Hip flexor stretch thomas position 30 sec x 2 reps ea side  Doorway stretch 3 positions 30 sec hold x 2 reps ea position  Row blue TB 10 reps x 2 sets Bow and arrow blue TB 10 reps x 2 sets  Antirotation blue TB x 10 x  2 sets each side  Standing trunk extension 5 sec x 3 reps  Hamstring stretch supine 30 sec x 2 reps with strap  Piriformis stretch supine travell 30 sec x 2 reps each side with strap  Prone press up 3 sec hold x 10 reps  Diaphragmatic breathing count of 6 x 5 min Transverse abdominals - 4 part core 10 sec hold x 10 reps  Hamstring stretch - Sitting 30 sec hold x 1 rep each side  Hip flexor stretch sitting 30 sec hold x 1 rep each side  Supine T on 4 inch foam roll ~ 2 min  Modalities: Moist heat x 10 min cervical and lumbar spine    Manual    PATIENT EDUCATION: Education details: HEP Person educated: Patient Education method:  Consulting civil engineer, Media planner, Corporate treasurer cues, Verbal cues, and Handouts Education comprehension: verbalized understanding, returned demonstration, verbal cues required, tactile cues required, and needs further education   HOME EXERCISE PROGRAM: Access Code: [email or web address] URL: [email or web address] Date: 10/12/2021 Prepared by: Gillermo Murdoch  Exercises - Prone Press Up  - 2 x daily - 7 x weekly - 1 sets - 10 reps - 2-3 sec  hold - Standing Lumbar Extension  - 2 x daily - 7 x weekly - 1 sets - 2-3 reps - 2-3 sec  hold - Hooklying Hamstring Stretch with Strap  - 2 x daily - 7 x weekly - 1 sets - 3 reps - 30 sec  hold - Supine Piriformis Stretch with Leg Straight  - 2 x daily - 7 x weekly - 1 sets - 3 reps - 30 sec  hold - Hip Flexor Stretch at Edge of Bed  - 2 x daily - 7 x weekly - 1 sets - 3 reps - 30 sec  hold - Supine Transversus Abdominis Bracing with Pelvic Floor Contraction  - 2 x daily - 7 x weekly - 1 sets - 10 reps - 10sec  hold - Seated Hamstring Stretch  - 2 x daily - 7 x weekly - 1 sets - 3 reps - 30 sec  hold - Seated Hip Flexor Stretch  - 2 x daily - 7 x weekly - 1 sets - 3 reps - 30 sec  hold - Doorway Pec Stretch at 60 Degrees Abduction  - 3 x daily - 7 x weekly - 1 sets - 3 reps - Doorway Pec Stretch at 90 Degrees Abduction  - 3 x daily - 7 x weekly - 1 sets - 3 reps - 30 seconds  hold - Doorway Pec Stretch at 120 Degrees Abduction  - 3 x daily - 7 x weekly - 1 sets - 3 reps - 30 second hold  hold - Supine Diaphragmatic Breathing  - 2 x daily - 7 x weekly - 1 sets - 10 reps - 4-6 sec  hold - Standing Bilateral Low Shoulder Row with Anchored Resistance  - 2 x daily - 7 x weekly - 1-3 sets - 10 reps - 2-3 sec  hold - Drawing Bow  - 1 x daily - 7 x weekly - 1 sets - 10 reps - 3 sec  hold - Anti-Rotation Lateral Stepping with Press  - 2 x daily - 7 x weekly - 1-2 sets - 10 reps - 2-3 sec  hold - Plank on Counter  - 2 x daily - 7 x weekly - 1 sets - 3 reps - 30 sec   hold      PT Long Term Goals -  PT LONG TERM GOAL #1   Title Improve posture and alignment with patient to demonstrate improved upright posture with posterior shoudler girdle engaged    Time 8    Status New    Target Date 11/15/21      PT LONG TERM GOAL #2   Title Patient to report decrease of mid and low back pain 50-70% allowing him to stand and walk at work with less pain and greater ease    Baseline -    Time 8    Period Weeks    Status New    Target Date 11/15/21      PT LONG TERM GOAL #3   Title Increase trunk flexion to 70% with minimal to no pain    Baseline -    Time 8    Period Weeks    Status New    Target Date 11/15/21      PT LONG TERM GOAL #4   Title Independent in HEP    Time 8    Status New    Target Date 11/15/21      PT LONG TERM GOAL #5   Title Improve functional limitation score to 56    Time 8    Period Weeks    Status New    Target Date 11/15/21           Access Code: [email or web address] URL: [email or web address] Date: 09/27/2021 Prepared by: Gillermo Murdoch  Exercises - Prone Press Up  - 2 x daily - 7 x weekly - 1 sets - 10 reps - 2-3 sec  hold - Standing Lumbar Extension  - 2 x daily - 7 x weekly - 1 sets - 2-3 reps - 2-3 sec  hold - Hooklying Hamstring Stretch with Strap  - 2 x daily - 7 x weekly - 1 sets - 3 reps - 30 sec  hold - Supine Piriformis Stretch with Leg Straight  - 2 x daily - 7 x weekly - 1 sets - 3 reps - 30 sec  hold - Hip Flexor Stretch at Edge of Bed  - 2 x daily - 7 x weekly - 1 sets - 3 reps - 30 sec  hold - Supine Transversus Abdominis Bracing with Pelvic Floor Contraction  - 2 x daily - 7 x weekly - 1 sets - 10 reps - 10sec  hold - Seated Hamstring Stretch  - 2 x daily - 7 x weekly - 1 sets - 3 reps - 30 sec  hold - Seated Hip Flexor Stretch  - 2 x daily - 7 x weekly - 1 sets - 3 reps - 30 sec  hold - Doorway Pec Stretch at 60 Degrees Abduction  - 3 x daily - 7 x weekly - 1 sets - 3 reps - Doorway Pec  Stretch at 90 Degrees Abduction  - 3 x daily - 7 x weekly - 1 sets - 3 reps - 30 seconds  hold - Doorway Pec Stretch at 120 Degrees Abduction  - 3 x daily - 7 x weekly - 1 sets - 3 reps - 30 second hold  hold   Assessment: Patient continues to have increased LBP which he feels is related to the work he does - bending frequently through the day. 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 Patient tolerated increased activity today with focus on core strength and stabilization. Encouraged patient to work on [employer] consistently.   Plan: continue with back  care education; stretching, strengthening, core stabilization and strengthening; modalities as indicated.                   Everardo All, PT, MPH  10/12/2021, 3:37 PM    [facility] [place], [place], [address] Phone: [phone]   Fax:  [phone]  Physical Therapy Treatment  Patient Details  Name: Clearance Chenault MRN: [id number] Date of Birth: June 18, 1975 Referring Provider (PT): Dr Luetta Nutting   Encounter Date: 10/12/2021   PT End of Session - 10/12/21 1536     Visit Number 4    Number of Visits 16    Date for PT Re-Evaluation 11/15/21    PT Start Time 9381    PT Stop Time 1618    PT Time Calculation (min) 48 min    Activity Tolerance Patient tolerated treatment well             Past Medical History:  Diagnosis Date   Dislocation of metatarsophalangeal joint of right great toe    Erectile dysfunction 12/24/2012   BCBS will not cover Cialis    Essential hypertension 06/30/2014   GERD (gastroesophageal reflux disease)    Headache    History of kidney stones    History of thrombocytosis 03/09/2013   Hypogonadism in male 12/15/2013   Recheck therapy January 2016    Kidney stone    Pneumonia age 40   Pre-diabetes    Sleep apnea    self report   Suicide attempt ([place]) 12/10/2013     Past Surgical History:  Procedure Laterality Date   ANTERIOR CERVICAL DECOMP/DISCECTOMY FUSION N/A 09/08/2020   Procedure: ANTERIOR CERVICAL DECOMPRESSION FUSION CERVICAL 4- CERVICAL5 , CERVICAL 5 - CERVICAL 6 WITH INSTRUMENTATION AND ALLOGRAFT;  Surgeon: Phylliss Bob, MD;  Location: [place];  Service: Orthopedics;  Laterality: N/A;   DENTAL SURGERY Left 2018   Has metal maybe titanium   NO PAST SURGERIES      There were no vitals filed for this visit.                                    PT Long Term Goals - 09/20/21 1615       PT LONG TERM GOAL #1   Title Improve posture and alignment with patient to demonstrate improved upright posture with posterior shoudler girdle engaged    Time 8    Status New    Target Date 11/15/21      PT LONG TERM GOAL #2   Title Patient to report decrease of mid and low back pain 50-70% allowing him to stand and walk at work with less pain and greater ease    Baseline -    Time 8    Period Weeks    Status New    Target Date 11/15/21      PT LONG TERM GOAL #3   Title Increase trunk flexion to 70% with minimal to no pain    Baseline -    Time 8    Period Weeks    Status New    Target Date 11/15/21      PT LONG TERM GOAL #4   Title Independent in HEP    Time 8    Status New    Target Date 11/15/21      PT LONG TERM GOAL #5   Title Improve functional limitation score  to 56    Time 8    Period Weeks    Status New    Target Date 11/15/21                    Patient will benefit from skilled therapeutic intervention in order to improve the following deficits and impairments:     Visit Diagnosis: Chronic bilateral low back pain without sciatica  Abnormal posture  Muscle weakness (generalized)  Other symptoms and signs involving the musculoskeletal system  Stiffness due to immobility     Problem List Patient Active Problem List   Diagnosis Date Noted   Atopic dermatitis 02/14/2021    Iron deficiency anemia 01/06/2020   Midline low back pain without sciatica 07/09/2019   Prediabetes 01/09/2019   Migraine 09/09/2018   H. pylori infection 05/03/2016   Dyspepsia 05/02/2016   Palpitations 11/28/2015   Panic attacks 06/14/2015   Nicotine vapor product user 10/11/2014   Essential hypertension 06/30/2014   Hypogonadism in male 12/15/2013   History of suicide attempt 12/10/2013   Degenerative disc disease, cervical 03/09/2013   History of thrombocytosis 03/09/2013   Erectile dysfunction 12/24/2012    Terilyn Sano Nilda Simmer, PT 10/12/2021, 3:37 PM  [facility] [place], [place], [address] Phone: [phone]   Fax:  [phone]  Name: Jawaan Adachi MRN: [id number] Date of Birth: 1975-08-07

## 2021-10-18 ENCOUNTER — Ambulatory Visit: Payer: BC Managed Care – PPO | Admitting: Rehabilitative and Restorative Service Providers"

## 2021-10-18 ENCOUNTER — Encounter: Payer: Self-pay | Admitting: Rehabilitative and Restorative Service Providers"

## 2021-10-18 DIAGNOSIS — Z7409 Other reduced mobility: Secondary | ICD-10-CM

## 2021-10-18 DIAGNOSIS — R29898 Other symptoms and signs involving the musculoskeletal system: Secondary | ICD-10-CM

## 2021-10-18 DIAGNOSIS — M2560 Stiffness of unspecified joint, not elsewhere classified: Secondary | ICD-10-CM | POA: Diagnosis not present

## 2021-10-18 DIAGNOSIS — M6281 Muscle weakness (generalized): Secondary | ICD-10-CM | POA: Diagnosis not present

## 2021-10-18 DIAGNOSIS — R293 Abnormal posture: Secondary | ICD-10-CM

## 2021-10-18 DIAGNOSIS — G8929 Other chronic pain: Secondary | ICD-10-CM

## 2021-10-18 DIAGNOSIS — M545 Low back pain, unspecified: Secondary | ICD-10-CM | POA: Diagnosis not present

## 2021-10-18 NOTE — Therapy (Addendum)
OUTPATIENT PHYSICAL THERAPY TREATMENT NOTE and Discharge Summary  PHYSICAL THERAPY DISCHARGE SUMMARY  Visits from Start of Care: 5  Current functional level related to goals / functional outcomes: See progress note for discharge status    Remaining deficits: Continued pain with no significant change in symptoms. Patient is inconsistent with any exercises at home.    Education / Equipment: HEP   Patient agrees to discharge. Patient goals were not met. Patient is being discharged due to  attempt to work on HEP and call if he would like to progress exercise program for home. .  Aida Lemaire P. Helene Kelp PT, MPH 11/29/21 11:49 AM  Patient Name: Justin Ashley MRN: [id number] DOB:April 28, 1975, 46 y.o., male Today's Date: 10/18/2021  PCP: Dr Luetta Nutting REFERRING PROVIDER: Dr Luetta Nutting   PT End of Session - 10/18/21 1405     Visit Number 5    Number of Visits 16    Date for PT Re-Evaluation 11/15/21    PT Start Time 3818    PT Stop Time 1452    PT Time Calculation (min) 49 min    Activity Tolerance Patient tolerated treatment well             Past Medical History:  Diagnosis Date   Dislocation of metatarsophalangeal joint of right great toe    Erectile dysfunction 12/24/2012   BCBS will not cover Cialis    Essential hypertension 06/30/2014   GERD (gastroesophageal reflux disease)    Headache    History of kidney stones    History of thrombocytosis 03/09/2013   Hypogonadism in male 12/15/2013   Recheck therapy January 2016    Kidney stone    Pneumonia age 76   Pre-diabetes    Sleep apnea    self report   Suicide attempt ([place]) 12/10/2013   Past Surgical History:  Procedure Laterality Date   ANTERIOR CERVICAL DECOMP/DISCECTOMY FUSION N/A 09/08/2020   Procedure: ANTERIOR CERVICAL DECOMPRESSION FUSION CERVICAL 4- CERVICAL5 , CERVICAL 5 - CERVICAL 6 WITH INSTRUMENTATION AND ALLOGRAFT;  Surgeon: Phylliss Bob, MD;  Location: [place];  Service: Orthopedics;  Laterality: N/A;    DENTAL SURGERY Left 2018   Has metal maybe titanium   NO PAST SURGERIES     Patient Active Problem List   Diagnosis Date Noted   Atopic dermatitis 02/14/2021   Iron deficiency anemia 01/06/2020   Midline low back pain without sciatica 07/09/2019   Prediabetes 01/09/2019   Migraine 09/09/2018   H. pylori infection 05/03/2016   Dyspepsia 05/02/2016   Palpitations 11/28/2015   Panic attacks 06/14/2015   Nicotine vapor product user 10/11/2014   Essential hypertension 06/30/2014   Hypogonadism in male 12/15/2013   History of suicide attempt 12/10/2013   Degenerative disc disease, cervical 03/09/2013   History of thrombocytosis 03/09/2013   Erectile dysfunction 12/24/2012    REFERRING DIAG: Chronic mid and low back pain   THERAPY DIAG:  Chronic bilateral low back pain without sciatica  Abnormal posture  Muscle weakness (generalized)  Other symptoms and signs involving the musculoskeletal system  Stiffness due to immobility  Rationale for Evaluation and Treatment Rehabilitation  PERTINENT HISTORY:  cervical disc surgery ~ 13 months ago with some improvement - pain now 1-2/10; arthritis; HTN; cyst on Rt kidney   PRECAUTIONS: Cervical fusion   SUBJECTIVE: Patient reports that he works "all the time" and has no time for exercises. He works at work and then he comes home to work on projects. It is hard to make modifications for work  and home environment.    PAIN:  Are you having pain? Yes: NPRS scale: 8/10 Pain location: mid to low back  Pain description: discomfort; aching  Aggravating factors: standing; standing after lying down  Relieving factors: lying down     TODAY'S TREATMENT:            UBE L5 x 4 min alternating fwd/back Nustep L5 x 3 min, L6 x 3 min U/LE's   Hip flexor stretch thomas position 30 sec x 2 reps ea side  Doorway stretch 3 positions 30 sec hold x 2 reps ea position  Row blue TB 10 reps x 2 sets Bow and arrow blue TB 10 reps x 2 sets   Antirotation blue TB x 10 x 2 sets each side  Standing trunk extension 5 sec x 3 reps  Hamstring stretch supine 30 sec x 2 reps with strap  Piriformis stretch supine travell 30 sec x 2 reps each side with strap  Prone press up 5-8 sec hold x 10 reps  Diaphragmatic breathing count of 6 x 5 min Transverse abdominals - 4 part core 10 sec hold x 10 reps  Hamstring stretch - Sitting 30 sec hold x 1 rep each side  Hip flexor stretch sitting 30 sec hold x 1 rep each side  Bridging with core engaged 10 sec x 10 reps  Supine T on 4 inch foam roll ~ 2 min Sit to stand hinged hip core engaged x 10  Modified dead lift 15# KB from 8 in stool x 10 x 2 sets   Modalities: Moist heat x 10 min cervical and lumbar spine    Manual    PATIENT EDUCATION: Education details: HEP; education re-exercise and making time for consistent exercise as well as the importance of modifying work and home work spaces ergonomically to avoid positions that irritate back pain.  Person educated: Patient Education method: Explanation, Demonstration, Tactile cues, Verbal cues, and Handouts Education comprehension: verbalized understanding, returned demonstration, verbal cues required, tactile cues required, and needs further education   HOME EXERCISE PROGRAM: Access Code: [email or web address] URL: [email or web address] Date: 10/12/2021 Prepared by: Gillermo Murdoch  Exercises - Prone Press Up  - 2 x daily - 7 x weekly - 1 sets - 10 reps - 2-3 sec  hold - Standing Lumbar Extension  - 2 x daily - 7 x weekly - 1 sets - 2-3 reps - 2-3 sec  hold - Hooklying Hamstring Stretch with Strap  - 2 x daily - 7 x weekly - 1 sets - 3 reps - 30 sec  hold - Supine Piriformis Stretch with Leg Straight  - 2 x daily - 7 x weekly - 1 sets - 3 reps - 30 sec  hold - Hip Flexor Stretch at Edge of Bed  - 2 x daily - 7 x weekly - 1 sets - 3 reps - 30 sec  hold - Supine Transversus Abdominis Bracing with Pelvic Floor Contraction  - 2 x daily - 7 x  weekly - 1 sets - 10 reps - 10sec  hold - Seated Hamstring Stretch  - 2 x daily - 7 x weekly - 1 sets - 3 reps - 30 sec  hold - Seated Hip Flexor Stretch  - 2 x daily - 7 x weekly - 1 sets - 3 reps - 30 sec  hold - Doorway Pec Stretch at 60 Degrees Abduction  - 3 x daily - 7 x weekly - 1 sets - 3 reps - Doorway  Pec Stretch at 90 Degrees Abduction  - 3 x daily - 7 x weekly - 1 sets - 3 reps - 30 seconds  hold - Doorway Pec Stretch at 120 Degrees Abduction  - 3 x daily - 7 x weekly - 1 sets - 3 reps - 30 second hold  hold - Supine Diaphragmatic Breathing  - 2 x daily - 7 x weekly - 1 sets - 10 reps - 4-6 sec  hold - Standing Bilateral Low Shoulder Row with Anchored Resistance  - 2 x daily - 7 x weekly - 1-3 sets - 10 reps - 2-3 sec  hold - Drawing Bow  - 1 x daily - 7 x weekly - 1 sets - 10 reps - 3 sec  hold - Anti-Rotation Lateral Stepping with Press  - 2 x daily - 7 x weekly - 1-2 sets - 10 reps - 2-3 sec  hold - Plank on Counter  - 2 x daily - 7 x weekly - 1 sets - 3 reps - 30 sec  hold      PT Long Term Goals -  10/18/21      PT LONG TERM GOAL #1   Title Improve posture and alignment with patient to demonstrate improved upright posture with posterior shoudler girdle engaged    Time 8    Status Ongoing    Target Date 11/15/21      PT LONG TERM GOAL #2   Title Patient to report decrease of mid and low back pain 50-70% allowing him to stand and walk at work with less pain and greater ease    Baseline -    Time 8    Period Weeks    Status Ongoing    Target Date 11/15/21      PT LONG TERM GOAL #3   Title Increase trunk flexion to 70% with minimal to no pain    Baseline -    Time 8    Period Weeks    Status Ongoing    Target Date 11/15/21      PT LONG TERM GOAL #4   Title Independent in HEP    Time 8    Status Ongoing    Target Date 11/15/21      PT LONG TERM GOAL #5   Title Improve functional limitation score to 56    Time 8    Period Weeks    Status Achieved - score  10/18/21 = 58   Target Date 11/15/21            No significant change in trunk or LE ROM/mobility.   Assessment: Patient continues to have increased LBP which he feels is related to the work he does - bending frequently through the day. 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 Patient is not finding time for exercise or changing ergonomics at work or home. Encouraged patient to work on HEP consistently and discussed realistic goals of therapy.  Patient reports continued pain in LB and does not demonstrate improving mobility or strength in core, LB, LE's. Do not anticipate improvement with patient only exercising when he is in PT.   Plan: continue with back care education; stretching, strengthening, core stabilization and strengthening; modalities as indicated as scheduled. Patient requested that he be placed on hold to try to work on his exercises at home. He will call to schedule as needed.    Everardo All, PT, MPH  10/18/2021, 2:58  PM  [facility] [place], [place], [address] Phone: [phone]   Fax:  [phone]                                       PT Long Term Goals - 09/20/21 1615       PT LONG TERM GOAL #1   Title Improve posture and alignment with patient to demonstrate improved upright posture with posterior shoudler girdle engaged    Time 8    Status New    Target Date 11/15/21      PT LONG TERM GOAL #2   Title Patient to report decrease of mid and low back pain 50-70% allowing him to stand and walk at work with less pain and greater ease    Baseline -    Time 8    Period Weeks    Status New    Target Date 11/15/21      PT LONG TERM GOAL #3   Title Increase trunk flexion to 70% with minimal to no pain    Baseline -    Time 8    Period Weeks    Status New    Target Date 11/15/21      PT LONG  TERM GOAL #4   Title Independent in HEP    Time 8    Status New    Target Date 11/15/21      PT LONG TERM GOAL #5   Title Improve functional limitation score to 56    Time 8    Period Weeks    Status New    Target Date 11/15/21                    Patient will benefit from skilled therapeutic intervention in order to improve the following deficits and impairments:     Visit Diagnosis: Chronic bilateral low back pain without sciatica  Abnormal posture  Muscle weakness (generalized)  Other symptoms and signs involving the musculoskeletal system  Stiffness due to immobility     Problem List Patient Active Problem List   Diagnosis Date Noted   Atopic dermatitis 02/14/2021   Iron deficiency anemia 01/06/2020   Midline low back pain without sciatica 07/09/2019   Prediabetes 01/09/2019   Migraine 09/09/2018   H. pylori infection 05/03/2016   Dyspepsia 05/02/2016   Palpitations 11/28/2015   Panic attacks 06/14/2015   Nicotine vapor product user 10/11/2014   Essential hypertension 06/30/2014   Hypogonadism in male 12/15/2013   History of suicide attempt 12/10/2013   Degenerative disc disease, cervical 03/09/2013   History of thrombocytosis 03/09/2013   Erectile dysfunction 12/24/2012    Dona Walby Nilda Simmer, PT, MPH  10/18/2021, 2:58 PM  [facility] [place], [place], [address] Phone: [phone]   Fax:  [phone]  Name: Justin Ashley MRN: [id number] Date of Birth: 04-Dec-1975

## 2021-10-23 ENCOUNTER — Encounter: Payer: Self-pay | Admitting: Family Medicine

## 2021-10-23 MED ORDER — LIDOCAINE 5 % EX PTCH: 1.0000 | MEDICATED_PATCH | CUTANEOUS | 0 refills | Status: AC

## 2021-11-16 ENCOUNTER — Telehealth: Payer: Self-pay

## 2021-11-16 NOTE — Telephone Encounter (Addendum)
Initiated Prior authorization [id number] 5% patches Via: Covermymeds Case/Key:[id number] Status: denied  as of 11/16/21 Reason:plan exclusion  Notified Pt via: Mychart

## 2021-11-24 ENCOUNTER — Telehealth: Payer: Self-pay

## 2021-11-24 NOTE — Telephone Encounter (Signed)
Started PA sent to plan and awaiting approval.

## 2021-11-24 NOTE — Telephone Encounter (Signed)
Pa was approved

## 2021-11-29 ENCOUNTER — Telehealth: Payer: Self-pay

## 2021-11-29 NOTE — Telephone Encounter (Signed)
Initiated Prior authorization XID:[id number] '[id number]'$  tablets Via: Covermymeds Case/Key:[id number] Status: approved  as of 11/27/21 Reason:Effective from 11/24/2021 through 11/23/2023. Notified Pt via: Mychart , submission from another user

## 2022-03-14 ENCOUNTER — Encounter: Payer: Self-pay | Admitting: Family Medicine

## 2022-03-14 DIAGNOSIS — E291 Testicular hypofunction: Secondary | ICD-10-CM

## 2022-03-14 NOTE — Telephone Encounter (Signed)
Rx pended.   Last OV: 08/23/21 Next OV: no appt scheduled Last labs:09/11/21 (wnl) Last RF: 08/22/21

## 2022-03-15 MED ORDER — TESTOSTERONE CYPIONATE 200 MG/ML IM SOLN: 300.0000 mg | INTRAMUSCULAR | 2 refills | Status: DC

## 2022-03-19 ENCOUNTER — Telehealth: Payer: Self-pay | Admitting: Family Medicine

## 2022-03-19 ENCOUNTER — Other Ambulatory Visit: Payer: Self-pay | Admitting: Family Medicine

## 2022-03-19 DIAGNOSIS — E291 Testicular hypofunction: Secondary | ICD-10-CM

## 2022-03-19 MED ORDER — TESTOSTERONE CYPIONATE 200 MG/ML IM SOLN: 300.0000 mg | INTRAMUSCULAR | 2 refills | Status: DC

## 2022-03-19 NOTE — Telephone Encounter (Signed)
Pt called requesting to have his remaining refills sent to [facility] [address] [facility] for Testosterone

## 2022-03-22 NOTE — Telephone Encounter (Signed)
Chart has been updated.

## 2022-04-02 ENCOUNTER — Encounter: Payer: Self-pay | Admitting: Family Medicine

## 2022-04-02 MED ORDER — UBRELVY 50 MG PO TABS: ORAL_TABLET | ORAL | 3 refills | Status: AC

## 2022-04-03 ENCOUNTER — Telehealth: Payer: Self-pay

## 2022-04-03 NOTE — Telephone Encounter (Addendum)
Initiated Prior authorization [id number] Cypionate 200MG/ML intramuscular solution Via: Covermymeds Case/Key:[id number] Status: denied as of 04/03/22 Reason:Not medically neccessary  Notified Pt [id number] pt

## 2022-04-23 ENCOUNTER — Ambulatory Visit
Admission: EM | Admit: 2022-04-23 | Discharge: 2022-04-23 | Disposition: A | Discharge status: Home / Self Care | Payer: BC Managed Care – PPO | Attending: Family Medicine | Admitting: Family Medicine

## 2022-04-23 DIAGNOSIS — J069 Acute upper respiratory infection, unspecified: Secondary | ICD-10-CM | POA: Diagnosis not present

## 2022-04-23 LAB — POC INFLUENZA A AND B ANTIGEN (URGENT CARE ONLY)
Influenza A Ag: NEGATIVE
Influenza B Ag: NEGATIVE

## 2022-04-23 MED ORDER — PROMETHAZINE-DM 6.25-15 MG/5ML PO SYRP: 5.0000 mL | ORAL_SOLUTION | Freq: Four times a day (QID) | ORAL | 0 refills | Status: DC | PRN

## 2022-04-23 NOTE — ED Triage Notes (Signed)
Pt c/o sore throat and cough since Friday. Hoarse sounding. Denies fever. Taking dayquil/nyquil and xycam prn. COVID neg at home yesterday.

## 2022-04-23 NOTE — Discharge Instructions (Addendum)
You had a negative COVID test at home.  The flu  tests are negative here.  This is an on specified viral infection.  I will give you a prescription cough medicine.  This might cause drowsiness.  It is useful at night Make sure you are drinking lots of fluids Antibiotics will not help you get better faster Call for problems

## 2022-04-23 NOTE — ED Provider Notes (Signed)
[name]    CSN: [id number] Arrival date & time: 04/23/22  1129      History   Chief Complaint Chief Complaint  Patient presents with   Sore Throat    HPI Justin Ashley is a 47 y.o. male.   HPI  Patient is on his third day of illness.  Sore throat cough since Friday.  Headache and bodyaches is moderate.  Denies any fever.  He is taking DayQuil and NyQuil, has taken some Zicam.  He did a COVID test that was negative.  He otherwise has been feeling well.  No known exposure at home or work to illness He has had 2 COVID vaccinations.  Did not get a flu shot this year Past Medical History:  Diagnosis Date   Dislocation of metatarsophalangeal joint of right great toe    Erectile dysfunction 12/24/2012   BCBS will not cover Cialis    Essential hypertension 06/30/2014   GERD (gastroesophageal reflux disease)    Headache    History of kidney stones    History of thrombocytosis 03/09/2013   Hypogonadism in male 12/15/2013   Recheck therapy January 2016    Kidney stone    Pneumonia age 65   Pre-diabetes    Sleep apnea    self report   Suicide attempt ([place]) 12/10/2013    Patient Active Problem List   Diagnosis Date Noted   Atopic dermatitis 02/14/2021   Iron deficiency anemia 01/06/2020   Midline low back pain without sciatica 07/09/2019   Prediabetes 01/09/2019   Migraine 09/09/2018   H. pylori infection 05/03/2016   Dyspepsia 05/02/2016   Palpitations 11/28/2015   Panic attacks 06/14/2015   Nicotine vapor product user 10/11/2014   Essential hypertension 06/30/2014   Hypogonadism in male 12/15/2013   History of suicide attempt 12/10/2013   Degenerative disc disease, cervical 03/09/2013   History of thrombocytosis 03/09/2013   Erectile dysfunction 12/24/2012    Past Surgical History:  Procedure Laterality Date   ANTERIOR CERVICAL DECOMP/DISCECTOMY FUSION N/A 09/08/2020   Procedure: ANTERIOR CERVICAL DECOMPRESSION FUSION CERVICAL 4- CERVICAL5 ,  CERVICAL 5 - CERVICAL 6 WITH INSTRUMENTATION AND ALLOGRAFT;  Surgeon: Phylliss Bob, MD;  Location: [facility];  Service: Orthopedics;  Laterality: N/A;   DENTAL SURGERY Left 2018   Has metal maybe titanium   NO PAST SURGERIES         Home Medications    Prior to Admission medications   Medication Sig Start Date End Date Taking? Authorizing Provider  promethazine-dextromethorphan (PROMETHAZINE-DM) 6.25-15 MG/5ML syrup Take 5 mLs by mouth 4 (four) times daily as needed for cough. 04/23/22  Yes Raylene Everts, MD  albuterol (VENTOLIN HFA) 108 (90 Base) MCG/ACT inhaler INHALE 2 PUFFS INTO THE LUNGS EVERY 6 HOURS AS NEEDED FOR WHEEZING OR SHORTNESS OF BREATH Patient taking differently: Inhale 2 puffs into the lungs every 6 (six) hours as needed for wheezing or shortness of breath. 07/14/20   Luetta Nutting, DO  AMBULATORY NON FORMULARY MEDICATION BD 25m syringe and BD 22G 1 and 1/2" needle, use to inject testosterone every two weeks.  BD 18G 1 and 1/2" needle used to draw up testosterone. Dx: Hypogonadism 06/30/14   HMarcial Pacas DO  clonazePAM (KLONOPIN) 0.5 MG tablet Take 1 tablet (0.5 mg total) by mouth 2 (two) times daily as needed for anxiety. Patient taking differently: Take 0.5 mg by mouth daily as needed for anxiety. 03/17/20   MLuetta Nutting DO  ibuprofen (ADVIL) 800 MG tablet Take 1 tablet (800  mg total) by mouth every 8 (eight) hours as needed. 02/27/21   Breeback, Jade L, PA-C  lidocaine (LIDODERM) 5 % Place 1 patch onto the skin daily. Remove & Discard patch within 12 hours or as directed by MD 10/23/21   Luetta Nutting, DO  losartan (COZAAR) 50 MG tablet TAKE 1 TABLET(50 MG) BY MOUTH AT BEDTIME 09/25/21   Luetta Nutting, DO  pregabalin (LYRICA) 100 MG capsule 1 capsule PO BID prn 08/23/21   Luetta Nutting, DO  tadalafil (CIALIS) 20 MG tablet Take 0.5-1 tablets (10-20 mg total) by mouth every other day as needed for erectile dysfunction. 03/23/20   Silverio Decamp, MD  tamsulosin  (FLOMAX) 0.4 MG CAPS capsule Take 1 capsule (0.4 mg total) by mouth daily after breakfast. 02/27/21   Breeback, Jade L, PA-C  testosterone cypionate (DEPOTESTOSTERONE CYPIONATE) 200 MG/ML injection Inject 1.5 mLs (300 mg total) into the muscle every 14 (fourteen) days. 03/19/22   Luetta Nutting, DO  traZODone (DESYREL) 50 MG tablet Take 1-2 tablets (50-100 mg total) by mouth at bedtime as needed for sleep. 09/14/20   Luetta Nutting, DO  triamcinolone cream (KENALOG) 0.1 % Apply 1 application topically 2 (two) times daily. 02/14/21   Luetta Nutting, DO  Ubrogepant (UBRELVY) 41 MG TABS TAKE 50 MG INITIALLY AT ONSET OF MIGRAINE. MAY REPEAT AFTER 2 HOURS IF NEEDED 04/02/22   Luetta Nutting, DO    Family History Family History  Problem Relation Age of Onset   Cancer Mother        lung   Colon polyps Mother    Cancer Father        stomach, deceased @  35yr   Hypertension Father    Colon cancer Father [phone]  Stomach cancer Father    Other Paternal Uncle        intestional cancer   Colon cancer Paternal Uncle [phone]  Stomach cancer Cousin    Colon cancer Cousin 460  Heart attack Cousin    Esophageal cancer Neg Hx     Social History Social History   Tobacco Use   Smoking status: Every Day    Types: E-cigarettes   Smokeless tobacco: Never  Vaping Use   Vaping Use: Every day   Substances: Nicotine, Flavoring  Substance Use Topics   Alcohol use: No   Drug use: No     Allergies   Hydrocodone-acetaminophen, Lisinopril, Oxycodone-acetaminophen, and Zoloft [sertraline hcl]   Review of Systems Review of Systems   Physical Exam Triage Vital Signs ED Triage Vitals  Enc Vitals Group     BP 04/23/22 1137 (!) 152/100     Pulse Rate 04/23/22 1137 81     Resp 04/23/22 1137 16     Temp 04/23/22 1137 98.3 F (36.8 C)     Temp Source 04/23/22 1137 Oral     SpO2 04/23/22 1137 100 %     Weight --      Height --      Head Circumference --      Peak Flow --      Pain Score 04/23/22 1139 0      Pain Loc --      Pain Edu? --      Excl. in [place] --    No data found.  Updated Vital Signs BP (!) 152/100 (BP Location: Right Arm)   Pulse 81   Temp 98.3 F (36.8 C) (Oral)   Resp 16   SpO2 100%   Visual Acuity  Right Eye Distance:   Left Eye Distance:   Bilateral Distance:    Right Eye Near:   Left Eye Near:    Bilateral Near:     Physical Exam   UC Treatments / Results  Labs (all labs ordered are listed, but only abnormal results are displayed) Labs Reviewed  POC INFLUENZA A AND B ANTIGEN (URGENT CARE ONLY)    EKG   Radiology No results found.  Procedures Procedures (including critical care time)  Medications Ordered in UC Medications - No data to display  Initial Impression / Assessment and Plan / UC Course  I have reviewed the triage vital signs and the nursing notes.  Pertinent labs & imaging results that were available during my care of the patient were reviewed by me and considered in my medical decision making (see chart for details).     Final Clinical Impressions(s) / UC Diagnoses   Final diagnoses:  Viral URI with cough     Discharge Instructions      You had a negative COVID test at home.  The flu and strep tests are negative here.  This is an on specified viral infection.  I will give you a prescription cough medicine.  This might cause drowsiness.  It is useful at night Make sure you are drinking lots of fluids Antibiotics will not help you get better faster Call for problems     ED Prescriptions     Medication Sig Dispense Auth. Provider   promethazine-dextromethorphan (PROMETHAZINE-DM) 6.25-15 MG/5ML syrup Take 5 mLs by mouth 4 (four) times daily as needed for cough. 118 mL Raylene Everts, MD      PDMP not reviewed this encounter.   Raylene Everts, MD 04/23/22 1247

## 2022-04-26 ENCOUNTER — Encounter: Payer: Self-pay | Admitting: Family Medicine

## 2022-04-26 ENCOUNTER — Ambulatory Visit: Payer: BC Managed Care – PPO | Admitting: Family Medicine

## 2022-04-26 VITALS — BP 161/94 | HR 78 | Ht 68.0 in | Wt 181.1 lb

## 2022-04-26 DIAGNOSIS — E291 Testicular hypofunction: Secondary | ICD-10-CM | POA: Diagnosis not present

## 2022-04-26 DIAGNOSIS — J040 Acute laryngitis: Secondary | ICD-10-CM

## 2022-04-26 DIAGNOSIS — G43809 Other migraine, not intractable, without status migrainosus: Secondary | ICD-10-CM

## 2022-04-26 DIAGNOSIS — I10 Essential (primary) hypertension: Secondary | ICD-10-CM

## 2022-04-26 MED ORDER — GUAIFENESIN-CODEINE 100-10 MG/5ML PO SOLN: 5.0000 mL | Freq: Four times a day (QID) | ORAL | 0 refills | Status: DC | PRN

## 2022-04-26 MED ORDER — BENZONATATE 200 MG PO CAPS: 200.0000 mg | ORAL_CAPSULE | Freq: Two times a day (BID) | ORAL | 0 refills | Status: DC | PRN

## 2022-04-26 MED ORDER — PREDNISONE 50 MG PO TABS: ORAL_TABLET | ORAL | 0 refills | Status: DC

## 2022-04-26 NOTE — Assessment & Plan Note (Signed)
Continue Ubrelvy as needed.

## 2022-04-26 NOTE — Assessment & Plan Note (Signed)
Recommend staying well-hydrated.  Humidifier by bedside and/or nasal saline rinses may be helpful.  Adding course of prednisone.  Adding Robitussin AC.  Tessalon Perles as needed as well.  Contact clinic if symptoms worsen.

## 2022-04-26 NOTE — Assessment & Plan Note (Signed)
Blood pressure is elevated.  Decongestants for treatment of his cold may be contributing.  Recommend follow-up in 2 weeks to recheck.

## 2022-04-26 NOTE — Progress Notes (Signed)
Justin Ashley - 47 y.o. male MRN [id number]  Date of birth: Dec 14, 1975  Subjective Chief Complaint  Patient presents with   Follow-up    4 month f/u on medications     HPI Justin Ashley is a 47 y.o. male 47 year old male here today for follow-up visit.  He is having upper respiratory symptoms with hoarseness, cough and mild congestion.  He has had this for about 1 week.  Seen in urgent care recently and prescribed Promethazine DM.  He reports that this really has not been effective for him.  He denies shortness of breath or wheezing.  He has not had any fever, chills or bodyaches.  He is doing well with Roselyn Meier for management of migraines.  He has not had any significant side effects from this.  Feels good with testosterone at current strength.  He is consistent with injections.  Blood pressure is elevated today.  He is taking losartan daily.  He has been taking decongestants for his cold.  ROS:  A comprehensive ROS was completed and negative except as noted per HPI  Allergies  Allergen Reactions   Hydrocodone-Acetaminophen Nausea And Vomiting   Lisinopril Cough    Cough   Oxycodone-Acetaminophen Nausea And Vomiting   Zoloft [Sertraline Hcl]     Suicide attempt    Past Medical History:  Diagnosis Date   Dislocation of metatarsophalangeal joint of right great toe    Erectile dysfunction 12/24/2012   BCBS will not cover Cialis    Essential hypertension 06/30/2014   GERD (gastroesophageal reflux disease)    Headache    History of kidney stones    History of thrombocytosis 03/09/2013   Hypogonadism in male 12/15/2013   Recheck therapy January 2016    Kidney stone    Pneumonia age 50   Pre-diabetes    Sleep apnea    self report   Suicide attempt ([place]) 12/10/2013    Past Surgical History:  Procedure Laterality Date   ANTERIOR CERVICAL DECOMP/DISCECTOMY FUSION N/A 09/08/2020   Procedure: ANTERIOR CERVICAL DECOMPRESSION FUSION CERVICAL 4- CERVICAL5 , CERVICAL 5 - CERVICAL 6  WITH INSTRUMENTATION AND ALLOGRAFT;  Surgeon: Phylliss Bob, MD;  Location: [facility];  Service: Orthopedics;  Laterality: N/A;   DENTAL SURGERY Left 2018   Has metal maybe titanium   NO PAST SURGERIES      Social History   Socioeconomic History   Marital status: Significant Other    Spouse name: Not on file   Number of children: Not on file   Years of education: Not on file   Highest education level: Not on file  Occupational History   Not on file  Tobacco Use   Smoking status: Every Day    Types: E-cigarettes   Smokeless tobacco: Never  Vaping Use   Vaping Use: Every day   Substances: Nicotine, Flavoring  Substance and Sexual Activity   Alcohol use: No   Drug use: No   Sexual activity: Yes  Other Topics Concern   Not on file  Social History Narrative   Not on file   Social Determinants of Health   Financial Resource Strain: Not on file  Food Insecurity: Not on file  Transportation Needs: Not on file  Physical Activity: Not on file  Stress: Not on file  Social Connections: Not on file    Family History  Problem Relation Age of Onset   Cancer Mother        lung   Colon polyps Mother    Cancer Father  stomach, deceased @  76yr   Hypertension Father    Colon cancer Father [phone]  Stomach cancer Father    Other Paternal Uncle        intestional cancer   Colon cancer Paternal Uncle 650  Stomach cancer Cousin    Colon cancer Cousin 458  Heart attack Cousin    Esophageal cancer Neg Hx     Health Maintenance  Topic Date Due   COVID-19 Vaccine (3 - Moderna risk series) 05/12/2022 (Originally 07/27/2019)   INFLUENZA VACCINE  06/10/2022 (Originally 10/10/2021)   Hepatitis C Screening  08/24/2022 (Originally 08/04/1991)   COLONOSCOPY (Pts [phone] coverage will need to be confirmed)  10/11/2024   DTaP/Tdap/Td (2 - Td or Tdap) 05/02/2026   HIV Screening  Completed   HPV VACCINES  Aged Out      ----------------------------------------------------------------------------------------------------------------------------------------------------------------------------------------------------------------- Physical Exam BP (!) 161/94 (BP Location: Left Arm, Patient Position: Sitting, Cuff Size: Normal)   Pulse 78   Ht 5' 8"$  (1.727 m)   Wt 181 lb 1.3 oz (82.1 kg)   SpO2 99%   BMI 27.53 kg/m   Physical Exam Constitutional:      Appearance: Normal appearance.  HENT:     Head: Normocephalic and atraumatic.  Eyes:     General: No scleral icterus. Cardiovascular:     Rate and Rhythm: Normal rate and regular rhythm.  Pulmonary:     Effort: Pulmonary effort is normal.     Breath sounds: Normal breath sounds.  Musculoskeletal:     Cervical back: Neck supple.  Neurological:     Mental Status: He is alert.  Psychiatric:        Mood and Affect: Mood normal.        Behavior: Behavior normal.     ------------------------------------------------------------------------------------------------------------------------------------------------------------------------------------------------------------------- Assessment and Plan  Essential hypertension Blood pressure is elevated.  Decongestants for treatment of his cold may be contributing.  Recommend follow-up in 2 weeks to recheck.  Hypogonadism in male Doing well with testosterone at current strength.  Levels have been therapeutic on previous labs..  Will plan to continue.  Migraine Continue Ubrelvy as needed.  Laryngitis Recommend staying well-hydrated.  Humidifier by bedside and/or nasal saline rinses may be helpful.  Adding course of prednisone.  Adding Robitussin AC.  Tessalon Perles as needed as well.  Contact clinic if symptoms worsen.   Meds ordered this encounter  Medications   predniSONE (DELTASONE) 50 MG tablet    Sig: Take 5028maily x5 days.    Dispense:  5 tablet    Refill:  0   guaiFENesin-codeine  100-10 MG/5ML syrup    Sig: Take 5 mLs by mouth every 6 (six) hours as needed for cough.    Dispense:  120 mL    Refill:  0   benzonatate (TESSALON) 200 MG capsule    Sig: Take 1 capsule (200 mg total) by mouth 2 (two) times daily as needed for cough.    Dispense:  20 capsule    Refill:  0    Return in about 6 months (around 10/25/2022) for Annual exam/Fasting labs.    This visit occurred during the SARS-CoV-2 public health emergency.  Safety protocols were in place, including screening questions prior to the visit, additional usage of staff PPE, and extensive cleaning of exam room while observing appropriate contact time as indicated for disinfecting solutions.  Things

## 2022-04-26 NOTE — Patient Instructions (Signed)
Stay well hydrated.  Use humidifier by bedside or nasal saline.  Let me know if medications are not helpful.

## 2022-04-26 NOTE — Assessment & Plan Note (Signed)
Doing well with testosterone at current strength.  Levels have been therapeutic on previous labs..  Will plan to continue.

## 2022-05-01 ENCOUNTER — Encounter: Payer: Self-pay | Admitting: Family Medicine

## 2022-06-06 ENCOUNTER — Encounter: Payer: Self-pay | Admitting: Family Medicine

## 2022-06-06 DIAGNOSIS — R490 Dysphonia: Secondary | ICD-10-CM

## 2022-06-20 ENCOUNTER — Other Ambulatory Visit: Payer: Self-pay | Admitting: Family Medicine

## 2022-06-28 DIAGNOSIS — R490 Dysphonia: Secondary | ICD-10-CM | POA: Diagnosis not present

## 2022-07-25 ENCOUNTER — Encounter: Payer: Self-pay | Admitting: Family Medicine

## 2022-07-25 DIAGNOSIS — D509 Iron deficiency anemia, unspecified: Secondary | ICD-10-CM

## 2022-07-25 MED ORDER — CLONAZEPAM 0.5 MG PO TABS: 0.5000 mg | ORAL_TABLET | Freq: Two times a day (BID) | ORAL | 0 refills | Status: DC | PRN

## 2022-07-25 MED ORDER — PANTOPRAZOLE SODIUM 40 MG PO TBEC: 40.0000 mg | DELAYED_RELEASE_TABLET | Freq: Every day | ORAL | 0 refills | Status: DC

## 2022-09-19 ENCOUNTER — Other Ambulatory Visit: Payer: Self-pay | Admitting: Family Medicine

## 2022-09-19 DIAGNOSIS — D509 Iron deficiency anemia, unspecified: Secondary | ICD-10-CM

## 2022-09-19 DIAGNOSIS — E291 Testicular hypofunction: Secondary | ICD-10-CM

## 2022-09-20 NOTE — Telephone Encounter (Signed)
Pls contact the pt to schedule appt in August. Thanks

## 2022-09-20 NOTE — Telephone Encounter (Signed)
Called spoke with patient he is scheduled for his August appointment.

## 2022-09-24 ENCOUNTER — Other Ambulatory Visit: Payer: Self-pay | Admitting: Family Medicine

## 2022-09-24 DIAGNOSIS — D509 Iron deficiency anemia, unspecified: Secondary | ICD-10-CM

## 2022-10-15 ENCOUNTER — Encounter: Payer: Self-pay | Admitting: Family Medicine

## 2022-10-15 ENCOUNTER — Ambulatory Visit: Payer: BC Managed Care – PPO | Admitting: Family Medicine

## 2022-10-15 VITALS — BP 105/72 | HR 85 | Ht 68.0 in | Wt 175.0 lb

## 2022-10-15 DIAGNOSIS — M545 Low back pain, unspecified: Secondary | ICD-10-CM

## 2022-10-15 DIAGNOSIS — R7303 Prediabetes: Secondary | ICD-10-CM

## 2022-10-15 DIAGNOSIS — E291 Testicular hypofunction: Secondary | ICD-10-CM

## 2022-10-15 DIAGNOSIS — D500 Iron deficiency anemia secondary to blood loss (chronic): Secondary | ICD-10-CM

## 2022-10-15 DIAGNOSIS — I10 Essential (primary) hypertension: Secondary | ICD-10-CM

## 2022-10-15 DIAGNOSIS — Z13220 Encounter for screening for lipoid disorders: Secondary | ICD-10-CM | POA: Diagnosis not present

## 2022-10-21 NOTE — Assessment & Plan Note (Signed)
Update A1c ?

## 2022-10-21 NOTE — Assessment & Plan Note (Signed)
Feels pretty good with current testosterone dosing.  Updated labs ordered.

## 2022-10-21 NOTE — Assessment & Plan Note (Signed)
Blood pressure mains well-controlled.  He will continue losartan at current strength.

## 2022-10-21 NOTE — Assessment & Plan Note (Signed)
He continues to have some low back pain.  Overall stable at this time.

## 2022-10-21 NOTE — Progress Notes (Signed)
Justin Ashley - 47 y.o. male MRN [id number]  Date of birth: 24-Oct-1975  Subjective Chief Complaint  Patient presents with   Hypertension   Back Pain   Neck Pain    HPI:  Justin Ashley is a 47 y.o. male here today for follow-up visit.  Overall doing pretty well.  He has had some continued back pain but overall stable.  Continues on losartan for management of hypertension.  He is tolerating this well at current strength and denies any side effects at this time.  He has not had chest pain, shortness of breath, palpitations, headaches or vision changes.   He feels pretty well with current strength of testosterone.  Occasional use of clonazepam as needed for anxiety.  He denies any worsening of his anxiety depressive symptoms.  ROS:  A comprehensive ROS was completed and negative except as noted per HPI  Allergies  Allergen Reactions   Hydrocodone-Acetaminophen Nausea And Vomiting   Lisinopril Cough    Cough   Oxycodone-Acetaminophen Nausea And Vomiting   Zoloft [Sertraline Hcl]     Suicide attempt    Past Medical History:  Diagnosis Date   Dislocation of metatarsophalangeal joint of right great toe    Erectile dysfunction 12/24/2012   BCBS will not cover Cialis    Essential hypertension 06/30/2014   GERD (gastroesophageal reflux disease)    Headache    History of kidney stones    History of thrombocytosis 03/09/2013   Hypogonadism in male 12/15/2013   Recheck therapy January 2016    Kidney stone    Pneumonia age 40   Pre-diabetes    Sleep apnea    self report   Suicide attempt ([facility]) 12/10/2013    Past Surgical History:  Procedure Laterality Date   ANTERIOR CERVICAL DECOMP/DISCECTOMY FUSION N/A 09/08/2020   Procedure: ANTERIOR CERVICAL DECOMPRESSION FUSION CERVICAL 4- CERVICAL5 , CERVICAL 5 - CERVICAL 6 WITH INSTRUMENTATION AND ALLOGRAFT;  Surgeon: Estill Bamberg, MD;  Location: [facility];  Service: Orthopedics;  Laterality: N/A;   DENTAL SURGERY Left 2018   Has metal maybe  titanium   NO PAST SURGERIES      Social History   Socioeconomic History   Marital status: Significant Other    Spouse name: Not on file   Number of children: Not on file   Years of education: Not on file   Highest education level: Not on file  Occupational History   Not on file  Tobacco Use   Smoking status: Every Day    Types: E-cigarettes   Smokeless tobacco: Never  Vaping Use   Vaping status: Every Day   Substances: Nicotine, Flavoring  Substance and Sexual Activity   Alcohol use: No   Drug use: No   Sexual activity: Yes  Other Topics Concern   Not on file  Social History Narrative   Not on file   Social Determinants of Health   Financial Resource Strain: Not on file  Food Insecurity: Not on file  Transportation Needs: Not on file  Physical Activity: Not on file  Stress: Not on file  Social Connections: Not on file    Family History  Problem Relation Age of Onset   Cancer Mother        lung   Colon polyps Mother    Cancer Father        stomach, deceased @  59yrs   Hypertension Father    Colon cancer Father 18   Stomach cancer Father    Other Paternal Uncle  intestional cancer   Colon cancer Paternal Uncle 53   Stomach cancer Cousin    Colon cancer Cousin 40   Heart attack Cousin    Esophageal cancer Neg Hx     Health Maintenance  Topic Date Due   COVID-19 Vaccine (3 - Moderna risk series) 01/15/2023 (Originally 07/27/2019)   INFLUENZA VACCINE  06/10/2023 (Originally 10/11/2022)   Hepatitis C Screening  10/15/2023 (Originally 08/04/1991)   Colonoscopy  10/11/2024   DTaP/Tdap/Td (2 - Td or Tdap) 05/02/2026   HIV Screening  Completed   HPV VACCINES  Aged Out     ----------------------------------------------------------------------------------------------------------------------------------------------------------------------------------------------------------------- Physical Exam BP 105/72 (BP Location: Left Arm, Patient Position: Sitting,  Cuff Size: Normal)   Pulse 85   Ht 5\' 8"  (1.727 m)   Wt 175 lb (79.4 kg)   SpO2 96%   BMI 26.61 kg/m   Physical Exam Constitutional:      Appearance: Normal appearance.  Cardiovascular:     Rate and Rhythm: Normal rate and regular rhythm.  Pulmonary:     Effort: Pulmonary effort is normal.     Breath sounds: Normal breath sounds.  Musculoskeletal:     Cervical back: Neck supple.  Neurological:     Mental Status: He is alert.  Psychiatric:        Mood and Affect: Mood normal.        Behavior: Behavior normal.     ------------------------------------------------------------------------------------------------------------------------------------------------------------------------------------------------------------------- Assessment and Plan  Essential hypertension Blood pressure mains well-controlled.  He will continue losartan at current strength.  Hypogonadism in male Feels pretty good with current testosterone dosing.  Updated labs ordered.  Midline low back pain without sciatica He continues to have some low back pain.  Overall stable at this time.  Prediabetes  Update A1c.   No orders of the defined types were placed in this encounter.   No follow-ups on file.    This visit occurred during the SARS-CoV-2 public health emergency.  Safety protocols were in place, including screening questions prior to the visit, additional usage of staff PPE, and extensive cleaning of exam room while observing appropriate contact time as indicated for disinfecting solutions.

## 2022-10-23 DIAGNOSIS — M5030 Other cervical disc degeneration, unspecified cervical region: Secondary | ICD-10-CM | POA: Diagnosis not present

## 2022-10-23 DIAGNOSIS — M545 Low back pain, unspecified: Secondary | ICD-10-CM | POA: Diagnosis not present

## 2022-10-24 DIAGNOSIS — M5030 Other cervical disc degeneration, unspecified cervical region: Secondary | ICD-10-CM | POA: Diagnosis not present

## 2022-10-24 DIAGNOSIS — M545 Low back pain, unspecified: Secondary | ICD-10-CM | POA: Diagnosis not present

## 2022-10-25 DIAGNOSIS — M5030 Other cervical disc degeneration, unspecified cervical region: Secondary | ICD-10-CM | POA: Diagnosis not present

## 2022-10-25 DIAGNOSIS — M545 Low back pain, unspecified: Secondary | ICD-10-CM | POA: Diagnosis not present

## 2022-11-23 DIAGNOSIS — M545 Low back pain, unspecified: Secondary | ICD-10-CM | POA: Diagnosis not present

## 2022-11-23 DIAGNOSIS — M5030 Other cervical disc degeneration, unspecified cervical region: Secondary | ICD-10-CM | POA: Diagnosis not present

## 2022-12-17 ENCOUNTER — Other Ambulatory Visit: Payer: Self-pay

## 2022-12-17 DIAGNOSIS — I10 Essential (primary) hypertension: Secondary | ICD-10-CM

## 2022-12-17 MED ORDER — LOSARTAN POTASSIUM 50 MG PO TABS: 50.0000 mg | ORAL_TABLET | Freq: Every evening | ORAL | 1 refills | Status: DC

## 2022-12-23 DIAGNOSIS — M5030 Other cervical disc degeneration, unspecified cervical region: Secondary | ICD-10-CM | POA: Diagnosis not present

## 2022-12-23 DIAGNOSIS — M545 Low back pain, unspecified: Secondary | ICD-10-CM | POA: Diagnosis not present

## 2023-01-23 DIAGNOSIS — M5030 Other cervical disc degeneration, unspecified cervical region: Secondary | ICD-10-CM | POA: Diagnosis not present

## 2023-01-23 DIAGNOSIS — M545 Low back pain, unspecified: Secondary | ICD-10-CM | POA: Diagnosis not present

## 2023-02-22 DIAGNOSIS — M545 Low back pain, unspecified: Secondary | ICD-10-CM | POA: Diagnosis not present

## 2023-02-22 DIAGNOSIS — M5030 Other cervical disc degeneration, unspecified cervical region: Secondary | ICD-10-CM | POA: Diagnosis not present

## 2023-03-25 ENCOUNTER — Other Ambulatory Visit: Payer: Self-pay | Admitting: Family Medicine

## 2023-03-25 DIAGNOSIS — M545 Low back pain, unspecified: Secondary | ICD-10-CM | POA: Diagnosis not present

## 2023-03-25 DIAGNOSIS — E291 Testicular hypofunction: Secondary | ICD-10-CM

## 2023-03-25 DIAGNOSIS — M5030 Other cervical disc degeneration, unspecified cervical region: Secondary | ICD-10-CM | POA: Diagnosis not present

## 2023-04-18 ENCOUNTER — Encounter: Payer: Self-pay | Admitting: Family Medicine

## 2023-04-18 ENCOUNTER — Ambulatory Visit: Payer: BC Managed Care – PPO | Admitting: Family Medicine

## 2023-04-18 VITALS — BP 137/79 | HR 85 | Ht 68.0 in | Wt 177.0 lb

## 2023-04-18 DIAGNOSIS — Z23 Encounter for immunization: Secondary | ICD-10-CM

## 2023-04-18 DIAGNOSIS — D509 Iron deficiency anemia, unspecified: Secondary | ICD-10-CM

## 2023-04-18 DIAGNOSIS — M545 Low back pain, unspecified: Secondary | ICD-10-CM

## 2023-04-18 DIAGNOSIS — I10 Essential (primary) hypertension: Secondary | ICD-10-CM

## 2023-04-18 DIAGNOSIS — F410 Panic disorder [episodic paroxysmal anxiety] without agoraphobia: Secondary | ICD-10-CM | POA: Diagnosis not present

## 2023-04-18 DIAGNOSIS — E291 Testicular hypofunction: Secondary | ICD-10-CM

## 2023-04-18 MED ORDER — TESTOSTERONE CYPIONATE 200 MG/ML IM SOLN
300.0000 mg | INTRAMUSCULAR | 3 refills | Status: DC
Start: 2023-04-18 — End: 2023-08-13

## 2023-04-18 MED ORDER — CLONAZEPAM 0.5 MG PO TABS: 0.5000 mg | ORAL_TABLET | Freq: Two times a day (BID) | ORAL | 0 refills | Status: AC | PRN

## 2023-04-18 MED ORDER — PANTOPRAZOLE SODIUM 40 MG PO TBEC: 40.0000 mg | DELAYED_RELEASE_TABLET | Freq: Every day | ORAL | 3 refills | Status: AC

## 2023-04-18 MED ORDER — LOSARTAN POTASSIUM 50 MG PO TABS
50.0000 mg | ORAL_TABLET | Freq: Every evening | ORAL | 3 refills | Status: DC
Start: 2023-04-18 — End: 2023-06-12

## 2023-04-18 NOTE — Assessment & Plan Note (Signed)
Blood pressure mains well-controlled.  He will continue losartan at current strength.

## 2023-04-18 NOTE — Assessment & Plan Note (Signed)
Feels pretty good with current testosterone dosing.  Updated labs ordered.

## 2023-04-18 NOTE — Progress Notes (Signed)
 Justin Ashley - 48 y.o. male MRN [id number]  Date of birth: 12-13-1975  Subjective Chief Complaint  Patient presents with   Hypertension    HPI Justin Ashley is a 48 y.o. male here today for follow-up visit.  He reports that he is doing pretty well at this time.  Continues to have some chronic back pain but overall this is stable.  Blood pressure mains well-controlled with losartan  at current strength.  No side effects at this time.  He has not had chest pain, shortness of breath, palpitations, headaches or vision changes.  Mood is stable..  Rare use of clonazepam  as needed.  Denies increased depressive symptoms.  .  Feels good with current strength of testosterone .  ROS:  A comprehensive ROS was completed and negative except as noted per HPI  Allergies  Allergen Reactions   Hydrocodone -Acetaminophen  Nausea And Vomiting   Lisinopril  Cough    Cough   Oxycodone-Acetaminophen  Nausea And Vomiting   Zoloft [Sertraline Hcl]     Suicide attempt    Past Medical History:  Diagnosis Date   Dislocation of metatarsophalangeal joint of right great toe    Erectile dysfunction 12/24/2012   BCBS will not cover Cialis     Essential hypertension 06/30/2014   GERD (gastroesophageal reflux disease)    Headache    History of kidney stones    History of thrombocytosis 03/09/2013   Hypogonadism in male 12/15/2013   Recheck therapy January 2016    Kidney stone    Pneumonia age 55   Pre-diabetes    Sleep apnea    self report   Suicide attempt (HCC) 12/10/2013    Past Surgical History:  Procedure Laterality Date   ANTERIOR CERVICAL DECOMP/DISCECTOMY FUSION N/A 09/08/2020   Procedure: ANTERIOR CERVICAL DECOMPRESSION FUSION CERVICAL 4- CERVICAL5 , CERVICAL 5 - CERVICAL 6 WITH INSTRUMENTATION AND ALLOGRAFT;  Surgeon: Beuford Anes, MD;  Location: [facility] OR;  Service: Orthopedics;  Laterality: N/A;   DENTAL SURGERY Left 2018   Has metal maybe titanium   NO PAST SURGERIES      Social History    Socioeconomic History   Marital status: Significant Other    Spouse name: Not on file   Number of children: Not on file   Years of education: Not on file   Highest education level: Not on file  Occupational History   Not on file  Tobacco Use   Smoking status: Every Day    Types: E-cigarettes   Smokeless tobacco: Never  Vaping Use   Vaping status: Every Day   Substances: Nicotine, Flavoring  Substance and Sexual Activity   Alcohol use: No   Drug use: No   Sexual activity: Yes  Other Topics Concern   Not on file  Social History Narrative   Not on file   Social Drivers of Health   Financial Resource Strain: Not on file  Food Insecurity: Not on file  Transportation Needs: Not on file  Physical Activity: Not on file  Stress: Not on file  Social Connections: Not on file    Family History  Problem Relation Age of Onset   Cancer Mother        lung   Colon polyps Mother    Cancer Father        stomach, deceased @  82yrs   Hypertension Father    Colon cancer Father 60   Stomach cancer Father    Other Paternal Uncle        intestional cancer   Colon cancer  Paternal Uncle 72   Stomach cancer Cousin    Colon cancer Cousin 40   Heart attack Cousin    Esophageal cancer Neg Hx     Health Maintenance  Topic Date Due   Hepatitis C Screening  10/15/2023 (Originally 08/04/1991)   Pneumococcal Vaccine 38-18 Years old (1 of 2 - PCV) 10/15/2024 (Originally 08/04/1979)   COVID-19 Vaccine (4 - 2024-25 season) 06/13/2023   Colonoscopy  10/11/2024   DTaP/Tdap/Td (2 - Td or Tdap) 05/02/2026   INFLUENZA VACCINE  Completed   HIV Screening  Completed   HPV VACCINES  Aged Out     ----------------------------------------------------------------------------------------------------------------------------------------------------------------------------------------------------------------- Physical Exam BP 137/79 (BP Location: Left Arm, Patient Position: Sitting, Cuff Size: Normal)    Pulse 85   Ht 5' 8 (1.727 m)   Wt 177 lb (80.3 kg)   SpO2 99%   BMI 26.91 kg/m   Physical Exam Constitutional:      Appearance: Normal appearance.  HENT:     Head: Normocephalic and atraumatic.  Eyes:     General: No scleral icterus. Cardiovascular:     Rate and Rhythm: Normal rate and regular rhythm.  Pulmonary:     Effort: Pulmonary effort is normal.     Breath sounds: Normal breath sounds.  Musculoskeletal:     Cervical back: Neck supple.  Neurological:     General: No focal deficit present.     Mental Status: He is alert.  Psychiatric:        Mood and Affect: Mood normal.        Behavior: Behavior normal.     ------------------------------------------------------------------------------------------------------------------------------------------------------------------------------------------------------------------- Assessment and Plan  Hypogonadism in male Feels pretty good with current testosterone  dosing.  Updated labs ordered.  Panic attacks Using clonazepam  as needed.  Using this pretty rarely.  Prescription renewed.  Essential hypertension Blood pressure mains well-controlled.  He will continue losartan  at current strength.  Midline low back pain without sciatica He continues to have some low back pain.  Overall stable at this time.   Meds ordered this encounter  Medications   clonazePAM  (KLONOPIN ) 0.5 MG tablet    Sig: Take 1 tablet (0.5 mg total) by mouth 2 (two) times daily as needed for anxiety.    Dispense:  30 tablet    Refill:  0   losartan  (COZAAR ) 50 MG tablet    Sig: Take 1 tablet (50 mg total) by mouth at bedtime.    Dispense:  90 tablet    Refill:  3   pantoprazole  (PROTONIX ) 40 MG tablet    Sig: Take 1 tablet (40 mg total) by mouth daily.    Dispense:  90 tablet    Refill:  3   testosterone  cypionate (DEPOTESTOSTERONE CYPIONATE) 200 MG/ML injection    Sig: Inject 1.5 mLs (300 mg total) into the muscle every 14 (fourteen) days.     Dispense:  3 mL    Refill:  3    Return in about 6 months (around 10/16/2023) for Hypertension, Mood/BH.    This visit occurred during the SARS-CoV-2 public health emergency.  Safety protocols were in place, including screening questions prior to the visit, additional usage of staff PPE, and extensive cleaning of exam room while observing appropriate contact time as indicated for disinfecting solutions.

## 2023-04-18 NOTE — Assessment & Plan Note (Signed)
He continues to have some low back pain.  Overall stable at this time.

## 2023-04-18 NOTE — Assessment & Plan Note (Signed)
 Using clonazepam  as needed.  Using this pretty rarely.  Prescription renewed.

## 2023-04-24 ENCOUNTER — Other Ambulatory Visit: Payer: Self-pay | Admitting: Family Medicine

## 2023-04-24 DIAGNOSIS — D509 Iron deficiency anemia, unspecified: Secondary | ICD-10-CM

## 2023-06-12 ENCOUNTER — Other Ambulatory Visit: Payer: Self-pay | Admitting: Family Medicine

## 2023-06-12 DIAGNOSIS — I10 Essential (primary) hypertension: Secondary | ICD-10-CM

## 2023-08-13 ENCOUNTER — Encounter: Payer: Self-pay | Admitting: Family Medicine

## 2023-08-13 DIAGNOSIS — E291 Testicular hypofunction: Secondary | ICD-10-CM

## 2023-08-13 MED ORDER — TESTOSTERONE CYPIONATE 200 MG/ML IM SOLN: 300.0000 mg | INTRAMUSCULAR | 0 refills | Status: DC

## 2023-08-13 NOTE — Telephone Encounter (Signed)
 Forwarding results to Cherre Cornish covering Dr. Augustus Ledger Requesting rx rf of testosterone  to [facility] [place] Last written 04/18/2023 Last OV 04/18/2023 Last Labs 10/15/2022 results >1500 Upcoming appt 11/05/2023

## 2023-08-14 ENCOUNTER — Telehealth: Payer: Self-pay

## 2023-08-14 DIAGNOSIS — E291 Testicular hypofunction: Secondary | ICD-10-CM

## 2023-08-14 NOTE — Telephone Encounter (Signed)
 Copied from CRM [id number]. Topic: Clinical - Prescription Issue >> Aug 14, 2023 12:22 PM Kevelyn M wrote: Reason for CRM: Justin Ashley with [facility] calling in and need clarity on the dosage amount. Each time they use it they only use it once. So there needs to be a vial for each dose. Please advise. Call back # [phone].

## 2023-08-14 NOTE — Telephone Encounter (Signed)
 This medication was simply a temporary refill as Dr. Augustus Ledger is out of the office.  It was refilled directly from the [facility] without changes in the dispense quantity.  On review of PDMP, he has been getting 3 mL for a 28-day supply for the last 11 months and it has been filled without difficulty.  ___________________________________________ Maryl Snook, DNP, APRN, FNP-BC Primary Care and Sports Medicine [facility]

## 2023-08-16 ENCOUNTER — Telehealth: Payer: Self-pay

## 2023-08-16 DIAGNOSIS — E291 Testicular hypofunction: Secondary | ICD-10-CM

## 2023-08-16 NOTE — Telephone Encounter (Signed)
 Copied from CRM [id number]. Topic: Clinical - Prescription Issue >> Aug 16, 2023  9:48 AM Shelby Dessert H wrote: Reason for CRM: Patient was calling because when he called about his rx for Testosterone  prescription it was only one vial but patient said he thinks he's suppose to get three, could you please call the patient and assist with this? [phone]. This is the week that the patient is suppose to give himself a shot but does not have it.

## 2023-08-21 MED ORDER — TESTOSTERONE CYPIONATE 200 MG/ML IM SOLN: 300.0000 mg | INTRAMUSCULAR | 0 refills | Status: DC

## 2023-08-21 NOTE — Telephone Encounter (Signed)
 Pharmacy contacted and it appears to be an issue with the single use vials.  I have placed a new prescription for the testosterone  at the same dose to dispense 4 vials so that every 2 weeks, there will be one half of a vial to get thrown away per pharmacy protocol.  Unclear why other pharmacies have been able to dispense 3 mL according to the pharmacist.  ___________________________________________ Maryl Snook, DNP, APRN, FNP-BC Primary Care and Sports Medicine [facility]

## 2023-08-21 NOTE — Telephone Encounter (Signed)
 Patient informed - he will also discuss this with [place] at [facility] when goes to pick up the prescription.

## 2023-09-24 ENCOUNTER — Other Ambulatory Visit: Payer: Self-pay | Admitting: Medical-Surgical

## 2023-09-24 DIAGNOSIS — E291 Testicular hypofunction: Secondary | ICD-10-CM

## 2023-09-25 NOTE — Telephone Encounter (Signed)
 Last filled 08/21/2023  Last office visit 04/18/2023

## 2023-09-26 ENCOUNTER — Telehealth: Payer: Self-pay

## 2023-09-26 ENCOUNTER — Other Ambulatory Visit (HOSPITAL_COMMUNITY): Payer: Self-pay

## 2023-09-26 NOTE — Telephone Encounter (Signed)
 Pharmacy Patient Advocate Encounter   Received notification from CoverMyMeds that prior authorization for Testosterone  Cypionate 200MG /ML intramuscular solution is required/requested.   Insurance verification completed.   The patient is insured through [facility] .   Initiated PA via CMM, patient does not have required lab results for approval (TWO pretreatment early morning testosterone  levels, drawn on subsequent days, with either free serum testosterone  level that is below the testing laboratorys normal range OR total serum testosterone  level that is below the testing laboratorys normal range OR total serum testosterone  level less than 300 ng/dL).   Per previous encounter, patient uses [facility] for prescription.

## 2023-09-26 NOTE — Telephone Encounter (Signed)
 Sent patient a Mychart message regarding Testosterone  prescription refill

## 2023-09-26 NOTE — Telephone Encounter (Signed)
 Copied from CRM [id number]. Topic: Clinical - Prescription Issue >> Sep 25, 2023  4:47 PM Graeme ORN wrote: Reason for CRM: Patient called to check status of refill request. testosterone  cypionate (DEPOTESTOSTERONE CYPIONATE) 200 MG/ML injection Let him know that it is still in process.

## 2023-10-16 ENCOUNTER — Ambulatory Visit: Payer: BC Managed Care – PPO | Admitting: Family Medicine

## 2023-10-21 ENCOUNTER — Other Ambulatory Visit: Payer: Self-pay | Admitting: Family Medicine

## 2023-10-21 DIAGNOSIS — E291 Testicular hypofunction: Secondary | ICD-10-CM

## 2023-10-29 ENCOUNTER — Ambulatory Visit: Admitting: Family Medicine

## 2023-10-29 ENCOUNTER — Encounter: Payer: Self-pay | Admitting: Family Medicine

## 2023-10-29 VITALS — BP 122/74 | HR 81 | Ht 68.0 in | Wt 181.0 lb

## 2023-10-29 DIAGNOSIS — M2550 Pain in unspecified joint: Secondary | ICD-10-CM | POA: Diagnosis not present

## 2023-10-29 DIAGNOSIS — D500 Iron deficiency anemia secondary to blood loss (chronic): Secondary | ICD-10-CM | POA: Diagnosis not present

## 2023-10-29 DIAGNOSIS — I10 Essential (primary) hypertension: Secondary | ICD-10-CM

## 2023-10-29 DIAGNOSIS — R350 Frequency of micturition: Secondary | ICD-10-CM | POA: Diagnosis not present

## 2023-10-29 DIAGNOSIS — M545 Low back pain, unspecified: Secondary | ICD-10-CM

## 2023-10-29 DIAGNOSIS — E291 Testicular hypofunction: Secondary | ICD-10-CM | POA: Diagnosis not present

## 2023-10-29 DIAGNOSIS — F17200 Nicotine dependence, unspecified, uncomplicated: Secondary | ICD-10-CM | POA: Insufficient documentation

## 2023-10-29 DIAGNOSIS — N3000 Acute cystitis without hematuria: Secondary | ICD-10-CM | POA: Insufficient documentation

## 2023-10-29 DIAGNOSIS — M5459 Other low back pain: Secondary | ICD-10-CM

## 2023-10-29 LAB — POCT URINALYSIS DIP (CLINITEK)
Bilirubin, UA: NEGATIVE
Blood, UA: NEGATIVE
Glucose, UA: NEGATIVE mg/dL
Ketones, POC UA: NEGATIVE mg/dL
Nitrite, UA: NEGATIVE
POC PROTEIN,UA: NEGATIVE
Spec Grav, UA: 1.02 (ref 1.010–1.025)
Urobilinogen, UA: 0.2 U/dL
pH, UA: 5.5 (ref 5.0–8.0)

## 2023-10-29 MED ORDER — CEPHALEXIN 500 MG PO CAPS: 500.0000 mg | ORAL_CAPSULE | Freq: Four times a day (QID) | ORAL | 0 refills | Status: AC

## 2023-10-29 MED ORDER — VARENICLINE TARTRATE (STARTER) 0.5 MG X 11 & 1 MG X 42 PO TBPK: ORAL_TABLET | ORAL | 0 refills | Status: AC

## 2023-10-29 NOTE — Assessment & Plan Note (Signed)
 Leukocytes in urine.  Adding course of Keflex ..  Will also check PSA

## 2023-10-29 NOTE — Assessment & Plan Note (Signed)
Feels pretty good with current testosterone dosing.  Updated labs ordered.

## 2023-10-29 NOTE — Progress Notes (Signed)
 Justin Ashley - 48 y.o. male MRN [id number]  Date of birth: 02/26/1976  Subjective Chief Complaint  Patient presents with   Hypertension   Urinary Tract Infection    HPI Justin Ashley is a 48 y.o. male here today for follow up visit.   He continues to have low back pain.  MRI in 2023 with multi level DDD but no significant stenosis or impingement noted.  Facet hypertrophy was noted.  He has tried lidoderm  patches and NSAID's with only limited relief.  Denies increased radiation of pain.  No weakness, numbness or tingling.    BP is stable with losartan .  Denies side effects. Has not had chest pain, shortness of breath, palpitations, headache or vision changes.   Continues on testosterone  and feels pretty good with current strength and dosing.  He has had some urinary urgency and frequency. Denies pain with urination.    He is interested in trying chantix  again to quit vaping.    ROS:  A comprehensive ROS was completed and negative except as noted per HPI  Allergies  Allergen Reactions   Hydrocodone -Acetaminophen  Nausea And Vomiting   Lisinopril  Cough    Cough   Oxycodone-Acetaminophen  Nausea And Vomiting   Zoloft [Sertraline Hcl]     Suicide attempt    Past Medical History:  Diagnosis Date   Dislocation of metatarsophalangeal joint of right great toe    Erectile dysfunction 12/24/2012   BCBS will not cover Cialis     Essential hypertension 06/30/2014   GERD (gastroesophageal reflux disease)    Headache    History of kidney stones    History of thrombocytosis 03/09/2013   Hypogonadism in male 12/15/2013   Recheck therapy January 2016    Kidney stone    Pneumonia age 61   Pre-diabetes    Sleep apnea    self report   Suicide attempt (HCC) 12/10/2013    Past Surgical History:  Procedure Laterality Date   ANTERIOR CERVICAL DECOMP/DISCECTOMY FUSION N/A 09/08/2020   Procedure: ANTERIOR CERVICAL DECOMPRESSION FUSION CERVICAL 4- CERVICAL5 , CERVICAL 5 - CERVICAL 6 WITH  INSTRUMENTATION AND ALLOGRAFT;  Surgeon: Beuford Anes, MD;  Location: [facility] OR;  Service: Orthopedics;  Laterality: N/A;   DENTAL SURGERY Left 2018   Has metal maybe titanium   NO PAST SURGERIES      Social History   Socioeconomic History   Marital status: Significant Other    Spouse name: Not on file   Number of children: Not on file   Years of education: Not on file   Highest education level: Not on file  Occupational History   Not on file  Tobacco Use   Smoking status: Every Day    Types: E-cigarettes   Smokeless tobacco: Never  Vaping Use   Vaping status: Every Day   Substances: Nicotine, Flavoring  Substance and Sexual Activity   Alcohol use: No   Drug use: No   Sexual activity: Yes  Other Topics Concern   Not on file  Social History Narrative   Not on file   Social Drivers of Health   Financial Resource Strain: Low Risk  (10/29/2023)   Overall Financial Resource Strain (CARDIA)    Difficulty of Paying Living Expenses: Not hard at all  Food Insecurity: No Food Insecurity (10/29/2023)   Hunger Vital Sign    Worried About Running Out of Food in the Last Year: Never true    Ran Out of Food in the Last Year: Never true  Transportation Needs: Unknown (10/29/2023)  PRAPARE - Administrator, Civil Service (Medical): No    Lack of Transportation (Non-Medical): Not on file  Physical Activity: Sufficiently Active (10/29/2023)   Exercise Vital Sign    Days of Exercise per Week: 5 days    Minutes of Exercise per Session: 150+ min  Stress: Stress Concern Present (10/29/2023)   [employer] of Occupational Health - Occupational Stress Questionnaire    Feeling of Stress: To some extent  Social Connections: Unknown (10/29/2023)   Social Connection and Isolation Panel    Frequency of Communication with Friends and Family: More than three times a week    Frequency of Social Gatherings with Friends and Family: More than three times a week    Attends Religious  Services: 1 to 4 times per year    Active Member of [employer] or Organizations: No    Attends Banker Meetings: Never    Marital Status: Patient declined    Family History  Problem Relation Age of Onset   Cancer Mother        lung   Colon polyps Mother    Cancer Father        stomach, deceased @  58yrs   Hypertension Father    Colon cancer Father 54   Stomach cancer Father    Other Paternal Uncle        intestional cancer   Colon cancer Paternal Uncle 48   Stomach cancer Cousin    Colon cancer Cousin 40   Heart attack Cousin    Esophageal cancer Neg Hx     Health Maintenance  Topic Date Due   Hepatitis C Screening  Never done   Pneumococcal Vaccine: 50+ Years (1 of 2 - PCV) Never done   Hepatitis B Vaccines 19-59 Average Risk (1 of 3 - 19+ 3-dose series) Never done   Zoster Vaccines- Shingrix (1 of 2) Never done   COVID-19 Vaccine (4 - Mixed Product risk 2024-25 season) 10/16/2023   INFLUENZA VACCINE  10/11/2023   Colonoscopy  10/11/2024   DTaP/Tdap/Td (2 - Td or Tdap) 05/02/2026   HIV Screening  Completed   HPV VACCINES  Aged Out   Meningococcal B Vaccine  Aged Out     ----------------------------------------------------------------------------------------------------------------------------------------------------------------------------------------------------------------- Physical Exam BP 122/74 (BP Location: Left Arm, Patient Position: Sitting, Cuff Size: Normal)   Pulse 81   Ht 5' 8 (1.727 m)   Wt 181 lb (82.1 kg)   SpO2 99%   BMI 27.52 kg/m   Physical Exam Constitutional:      Appearance: Normal appearance.  Eyes:     General: No scleral icterus. Cardiovascular:     Rate and Rhythm: Normal rate and regular rhythm.  Pulmonary:     Effort: Pulmonary effort is normal.     Breath sounds: Normal breath sounds.  Neurological:     General: No focal deficit present.     Mental Status: He is alert.  Psychiatric:        Mood and Affect: Mood  normal.        Behavior: Behavior normal.     ------------------------------------------------------------------------------------------------------------------------------------------------------------------------------------------------------------------- Assessment and Plan  Essential hypertension Blood pressure mains well-controlled.  He will continue losartan  at current strength.  Hypogonadism in male Feels pretty good with current testosterone  dosing.  Updated labs ordered.  Iron deficiency anemia Update CBC.   Midline low back pain without sciatica Continues to have low back pain.  Previous MRI with some facet arthropathy as well as bulging disc.  Referral placed  to interventional pain clinic.  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  Arthralgia Complains of morning stiffness and multiple joints.  Checking ANA and rheumatoid factor.  Acute cystitis Leukocytes in urine.  Adding course of Keflex ..  Will also check PSA   Meds ordered this encounter  Medications   cephALEXin  (KEFLEX ) 500 MG capsule    Sig: Take 1 capsule (500 mg total) by mouth 4 (four) times daily for 7 days.    Dispense:  28 capsule    Refill:  0   Varenicline  Tartrate, Starter, (CHANTIX  STARTING MONTH PAK) 0.5 MG X 11 & 1 MG X 42 TBPK    Sig: Take one 0.5 mg tablet by mouth once daily for 3 days, then increase to one 0.5 mg tablet twice daily for 4 days, then increase to one 1 mg tablet twice daily.    Dispense:  1 each    Refill:  0    No follow-ups on file.

## 2023-10-29 NOTE — Assessment & Plan Note (Addendum)
 Update CBC.

## 2023-10-29 NOTE — Assessment & Plan Note (Signed)
 Adding Chantix  to help with quitting vaping.

## 2023-10-29 NOTE — Assessment & Plan Note (Signed)
 Continues to have low back pain.  Previous MRI with some facet arthropathy as well as bulging disc.  Referral placed to interventional pain clinic.

## 2023-10-29 NOTE — Assessment & Plan Note (Signed)
 Complains of morning stiffness and multiple joints.  Checking ANA and rheumatoid factor.

## 2023-10-29 NOTE — Assessment & Plan Note (Signed)
Blood pressure mains well-controlled.  He will continue losartan at current strength.

## 2023-10-30 LAB — CBC WITH DIFFERENTIAL/PLATELET
Basophils Absolute: 0 x10E3/uL (ref 0.0–0.2)
Basos: 0 %
EOS (ABSOLUTE): 0.1 x10E3/uL (ref 0.0–0.4)
Eos: 2 %
Hematocrit: 40.1 % (ref 37.5–51.0)
Hemoglobin: 12.1 g/dL — ABNORMAL LOW (ref 13.0–17.7)
Immature Grans (Abs): 0.1 x10E3/uL (ref 0.0–0.1)
Immature Granulocytes: 1 %
Lymphocytes Absolute: 1.7 x10E3/uL (ref 0.7–3.1)
Lymphs: 24 %
MCH: 24.5 pg — ABNORMAL LOW (ref 26.6–33.0)
MCHC: 30.2 g/dL — ABNORMAL LOW (ref 31.5–35.7)
MCV: 81 fL (ref 79–97)
Monocytes Absolute: 0.4 x10E3/uL (ref 0.1–0.9)
Monocytes: 6 %
Neutrophils Absolute: 4.7 x10E3/uL (ref 1.4–7.0)
Neutrophils: 67 %
Platelets: 502 x10E3/uL — ABNORMAL HIGH (ref 150–450)
RBC: 4.93 x10E6/uL (ref 4.14–5.80)
RDW: 14.6 % (ref 11.6–15.4)
WBC: 7 x10E3/uL (ref 3.4–10.8)

## 2023-10-30 LAB — CMP14+EGFR
ALT: 20 IU/L (ref 0–44)
AST: 13 IU/L (ref 0–40)
Albumin: 4.6 g/dL (ref 4.1–5.1)
Alkaline Phosphatase: 60 IU/L (ref 44–121)
BUN/Creatinine Ratio: 10 (ref 9–20)
BUN: 10 mg/dL (ref 6–24)
Bilirubin Total: 0.3 mg/dL (ref 0.0–1.2)
CO2: 21 mmol/L (ref 20–29)
Calcium: 9.3 mg/dL (ref 8.7–10.2)
Chloride: 103 mmol/L (ref 96–106)
Creatinine, Ser: 1.04 mg/dL (ref 0.76–1.27)
Globulin, Total: 2.2 g/dL (ref 1.5–4.5)
Glucose: 97 mg/dL (ref 70–99)
Potassium: 4.1 mmol/L (ref 3.5–5.2)
Sodium: 137 mmol/L (ref 134–144)
Total Protein: 6.8 g/dL (ref 6.0–8.5)
eGFR: 87 mL/min/1.73 (ref >59)

## 2023-10-30 LAB — PSA: Prostate Specific Ag, Serum: 5.5 ng/mL — ABNORMAL HIGH (ref 0.0–4.0)

## 2023-10-30 LAB — TESTOSTERONE: Testosterone: >1500 ng/dL — ABNORMAL HIGH (ref 264–916)

## 2023-10-31 LAB — URINE CULTURE

## 2023-11-02 LAB — ANA,IFA RA DIAG PNL W/RFLX TIT/PATN
ANA Titer 1: NEGATIVE
Cyclic Citrullin Peptide Ab: 7 U (ref 0–19)
Rheumatoid fact SerPl-aCnc: <10 [IU]/mL (ref <14.0)

## 2023-11-08 ENCOUNTER — Ambulatory Visit: Payer: Self-pay | Admitting: Family Medicine

## 2023-11-08 DIAGNOSIS — R972 Elevated prostate specific antigen [PSA]: Secondary | ICD-10-CM

## 2023-11-18 ENCOUNTER — Other Ambulatory Visit: Payer: Self-pay | Admitting: Family Medicine

## 2023-11-18 DIAGNOSIS — D649 Anemia, unspecified: Secondary | ICD-10-CM

## 2023-11-18 DIAGNOSIS — R972 Elevated prostate specific antigen [PSA]: Secondary | ICD-10-CM

## 2023-11-19 LAB — FERRITIN: Ferritin: 38 ng/mL (ref 30–400)

## 2023-11-19 LAB — B12 AND FOLATE PANEL
Folate: 11.8 ng/mL (ref >3.0)
Vitamin B-12: 383 pg/mL (ref 232–1245)

## 2023-11-19 LAB — PSA: Prostate Specific Ag, Serum: 6.8 ng/mL — ABNORMAL HIGH (ref 0.0–4.0)

## 2023-11-20 DIAGNOSIS — M47816 Spondylosis without myelopathy or radiculopathy, lumbar region: Secondary | ICD-10-CM | POA: Diagnosis not present

## 2023-11-20 DIAGNOSIS — G894 Chronic pain syndrome: Secondary | ICD-10-CM | POA: Diagnosis not present

## 2023-11-24 ENCOUNTER — Ambulatory Visit: Payer: Self-pay | Admitting: Family Medicine

## 2023-11-24 DIAGNOSIS — R972 Elevated prostate specific antigen [PSA]: Secondary | ICD-10-CM

## 2023-11-27 ENCOUNTER — Telehealth: Payer: Self-pay | Admitting: Urology

## 2023-11-27 NOTE — Telephone Encounter (Signed)
 Called to confirm let pt know to be here 15 mins early and to bring photo id and insurance card. Also made them aware that we would have to r/s if they are more than 5 mins late.

## 2023-11-28 ENCOUNTER — Ambulatory Visit (INDEPENDENT_AMBULATORY_CARE_PROVIDER_SITE_OTHER): Admitting: Urology

## 2023-11-28 ENCOUNTER — Encounter: Payer: Self-pay | Admitting: Urology

## 2023-11-28 VITALS — BP 123/87 | HR 84 | Ht 68.0 in | Wt 180.0 lb

## 2023-11-28 DIAGNOSIS — E291 Testicular hypofunction: Secondary | ICD-10-CM | POA: Diagnosis not present

## 2023-11-28 DIAGNOSIS — R399 Unspecified symptoms and signs involving the genitourinary system: Secondary | ICD-10-CM | POA: Diagnosis not present

## 2023-11-28 DIAGNOSIS — R972 Elevated prostate specific antigen [PSA]: Secondary | ICD-10-CM | POA: Diagnosis not present

## 2023-11-28 DIAGNOSIS — N419 Inflammatory disease of prostate, unspecified: Secondary | ICD-10-CM | POA: Diagnosis not present

## 2023-11-28 LAB — URINALYSIS, ROUTINE W REFLEX MICROSCOPIC
Bilirubin, UA: NEGATIVE
Glucose, UA: NEGATIVE
Ketones, UA: NEGATIVE
Nitrite, UA: NEGATIVE
Protein,UA: NEGATIVE
Specific Gravity, UA: 1.01 (ref 1.005–1.030)
Urobilinogen, Ur: 0.2 mg/dL (ref 0.2–1.0)
pH, UA: 6 (ref 5.0–7.5)

## 2023-11-28 LAB — MICROSCOPIC EXAMINATION

## 2023-11-28 LAB — BLADDER SCAN AMB NON-IMAGING

## 2023-11-28 MED ORDER — ALFUZOSIN HCL ER 10 MG PO TB24: 10.0000 mg | ORAL_TABLET | Freq: Every day | ORAL | 5 refills | Status: AC

## 2023-11-28 MED ORDER — SULFAMETHOXAZOLE-TRIMETHOPRIM 800-160 MG PO TABS: 1.0000 | ORAL_TABLET | Freq: Two times a day (BID) | ORAL | 0 refills | Status: AC

## 2023-11-28 NOTE — Progress Notes (Signed)
 Assessment: 1. Prostatitis, unspecified prostatitis type   2. Elevated PSA   3. Lower urinary tract symptoms (LUTS)   4. Hypogonadism in male     Plan: I personally reviewed the patient's chart including provider notes, lab results. Today I had a long discussion with the patient regarding PSA and the rationale and controversies of prostate cancer early detection.  I discussed the pros and cons of further evaluation including TRUS and prostate Bx.  Potential adverse events and complications as well as standard instructions were given.  Patient expressed his understanding of these issues. His exam was consistent with prostatitis.  This would explain his increased lower urinary tract symptoms as well as the elevated PSA. Diagnosis and management of prostatitis discussed. Begin Bactrim  DS twice daily x 21 days. Alfuzosin  10 mg daily for lower urinary tract symptoms.  Prescription sent. I discussed alternative management options for his hypogonadism including topical therapy, oral therapy, and subcutaneous injections.  He does appear to have very high levels of testosterone  postinjection.  I discussed the potential implications of this. Return to office in 1 month. Will arrange for follow-up PSA testing after his next visit.  Chief Complaint:  Chief Complaint  Patient presents with   Elevated PSA    History of Present Illness:  Justin Ashley is a 48 y.o. male who is seen in consultation from Alvia Bring, DO for evaluation of elevated PSA. PSA results: 8/18 1.4 9/19 2.1 3/20 1.7 11/21 1.88 7/22 1.77 7/23 2.03 8/24 2.5 8/25 5.5  9/25 6.8  He has had increased lower urinary tract symptoms for approximately 2 months.  His symptoms include frequency, voiding every 1-2 hours, nocturia 2-3 times, slight dysuria, intermittent stream, hesitancy, and decreased force of stream.  No gross hematuria.  No fevers or chills. IPSS = 21/5. He was treated for a presumed UTI with cephalexin .  No  change in his symptoms.  He has been on testosterone  replacement therapy with IM testosterone  injections every 2 weeks.  He is currently using a dosage of 150 mg. His most recent testosterone  level was >1500.    Past Medical History:  Past Medical History:  Diagnosis Date   Dislocation of metatarsophalangeal joint of right great toe    Erectile dysfunction 12/24/2012   [employer] will not cover Cialis     Essential hypertension 06/30/2014   GERD (gastroesophageal reflux disease)    Headache    History of kidney stones    History of thrombocytosis 03/09/2013   Hypogonadism in male 12/15/2013   Recheck therapy January 2016    Kidney stone    Pneumonia age 51   Pre-diabetes    Sleep apnea    self report   Suicide attempt (HCC) 12/10/2013    Past Surgical History:  Past Surgical History:  Procedure Laterality Date   ANTERIOR CERVICAL DECOMP/DISCECTOMY FUSION N/A 09/08/2020   Procedure: ANTERIOR CERVICAL DECOMPRESSION FUSION CERVICAL 4- CERVICAL5 , CERVICAL 5 - CERVICAL 6 WITH INSTRUMENTATION AND ALLOGRAFT;  Surgeon: Beuford Anes, MD;  Location: [facility] OR;  Service: Orthopedics;  Laterality: N/A;   DENTAL SURGERY Left 2018   Has metal maybe titanium   NO PAST SURGERIES      Allergies:  Allergies  Allergen Reactions   Hydrocodone -Acetaminophen  Nausea And Vomiting   Lisinopril  Cough    Cough   Oxycodone-Acetaminophen  Nausea And Vomiting   Zoloft [Sertraline Hcl]     Suicide attempt    Family History:  Family History  Problem Relation Age of Onset   Cancer Mother  lung   Colon polyps Mother    Cancer Father        stomach, deceased @  31yrs   Hypertension Father    Colon cancer Father 48   Stomach cancer Father    Other Paternal Uncle        intestional cancer   Colon cancer Paternal Uncle 81   Stomach cancer Cousin    Colon cancer Cousin 40   Heart attack Cousin    Esophageal cancer Neg Hx     Social History:  Social History   Tobacco Use   Smoking status:  Every Day    Types: E-cigarettes   Smokeless tobacco: Never  Vaping Use   Vaping status: Every Day   Substances: Nicotine, Flavoring  Substance Use Topics   Alcohol use: No   Drug use: No    Review of symptoms:  Constitutional:  Negative for unexplained weight loss, night sweats, fever, chills ENT:  Negative for nose bleeds, sinus pain, painful swallowing CV:  Negative for chest pain, shortness of breath, exercise intolerance, palpitations, loss of consciousness Resp:  Negative for cough, wheezing, shortness of breath GI:  Negative for nausea, vomiting, diarrhea, bloody stools GU:  Positives noted in HPI; otherwise negative for gross hematuria, dysuria, urinary incontinence Neuro:  Negative for seizures, poor balance, limb weakness, slurred speech Psych:  Negative for lack of energy, depression, anxiety Endocrine:  Negative for polydipsia, polyuria, symptoms of hypoglycemia (dizziness, hunger, sweating) Hematologic:  Negative for anemia, purpura, petechia, prolonged or excessive bleeding, use of anticoagulants  Allergic:  Negative for difficulty breathing or choking as a result of exposure to anything; no shellfish allergy; no allergic response (rash/itch) to materials, foods  Physical exam: BP 123/87   Pulse 84   Ht 5' 8 (1.727 m)   Wt 180 lb (81.6 kg)   BMI 27.37 kg/m  GENERAL APPEARANCE:  Well appearing, well developed, well nourished, NAD HEENT: Atraumatic, Normocephalic, oropharynx clear. NECK: Supple without lymphadenopathy or thyromegaly. LUNGS: Clear to auscultation bilaterally. HEART: Regular Rate and Rhythm without murmurs, gallops, or rubs. ABDOMEN: Soft, non-tender, No Masses. EXTREMITIES: Moves all extremities well.  Without clubbing, cyanosis, or edema. NEUROLOGIC:  Alert and oriented x 3, normal gait, CN II-XII grossly intact.  MENTAL STATUS:  Appropriate. BACK:  Non-tender to palpation.  No CVAT SKIN:  Warm, dry and intact.  GU: Penis:  circumcised Meatus:  Normal Scrotum: normal, no masses Testis: normal without masses bilateral Epididymis: normal Prostate: 30 g, tender, no nodules Rectum: Normal tone,  no masses or tenderness    Results: U/A: 6-10 WBCs, 0-2 RBCs  PVR = 204 ml

## 2023-12-03 ENCOUNTER — Other Ambulatory Visit (HOSPITAL_COMMUNITY): Payer: Self-pay

## 2023-12-03 ENCOUNTER — Telehealth: Payer: Self-pay

## 2023-12-03 NOTE — Telephone Encounter (Signed)
 Pharmacy Patient Advocate Encounter   Received notification from CoverMyMeds that prior authorization for Ubrelvy  50mg  tabs is due for renewal.   Insurance verification completed.   The patient is insured through [employer].  Action: PA required; PA submitted to above mentioned insurance via Latent Key/confirmation #/EOC [id number] Status is pending

## 2023-12-04 NOTE — Telephone Encounter (Signed)
 Pharmacy Patient Advocate Encounter  Received notification from [facility] that Prior Authorization for Ubrelvy  50mg  tabs has been APPROVED from 12/03/23 to 12/02/24   PA #/Case ID/Reference #: [id number]

## 2023-12-11 DIAGNOSIS — M47816 Spondylosis without myelopathy or radiculopathy, lumbar region: Secondary | ICD-10-CM | POA: Diagnosis not present

## 2023-12-26 ENCOUNTER — Ambulatory Visit: Admitting: Urology

## 2023-12-26 ENCOUNTER — Encounter: Payer: Self-pay | Admitting: Urology

## 2023-12-26 VITALS — BP 128/84 | HR 72 | Ht 68.0 in | Wt 180.0 lb

## 2023-12-26 DIAGNOSIS — N419 Inflammatory disease of prostate, unspecified: Secondary | ICD-10-CM

## 2023-12-26 DIAGNOSIS — R399 Unspecified symptoms and signs involving the genitourinary system: Secondary | ICD-10-CM

## 2023-12-26 DIAGNOSIS — R972 Elevated prostate specific antigen [PSA]: Secondary | ICD-10-CM

## 2023-12-26 DIAGNOSIS — E291 Testicular hypofunction: Secondary | ICD-10-CM

## 2023-12-26 LAB — URINALYSIS, ROUTINE W REFLEX MICROSCOPIC
Bilirubin, UA: NEGATIVE
Glucose, UA: NEGATIVE
Ketones, UA: NEGATIVE
Nitrite, UA: NEGATIVE
Protein,UA: NEGATIVE
RBC, UA: NEGATIVE
Specific Gravity, UA: 1.02 (ref 1.005–1.030)
Urobilinogen, Ur: 0.2 mg/dL (ref 0.2–1.0)
pH, UA: 6 (ref 5.0–7.5)

## 2023-12-26 LAB — MICROSCOPIC EXAMINATION: Bacteria, UA: NONE SEEN

## 2023-12-26 LAB — BLADDER SCAN AMB NON-IMAGING: Scan Result: 90

## 2023-12-26 NOTE — Progress Notes (Signed)
 Assessment: 1. Prostatitis, unspecified prostatitis type   2. Elevated PSA   3. Lower urinary tract symptoms (LUTS)   4. Hypogonadism in male     Plan: Continue alfuzosin  10 mg daily for lower urinary tract symptoms.  PSA in 6 weeks - will call with results  Chief Complaint:  Chief Complaint  Patient presents with   Prostatitis    History of Present Illness:  Justin Ashley is a 48 y.o. male who is seen for further evaluation of elevated PSA, prostatitis, LUTS, and hypogonadism.  PSA results: 8/18 1.4 9/19 2.1 3/20 1.7 11/21 1.88 7/22 1.77 7/23 2.03 8/24 2.5 8/25 5.5  9/25 6.8  At his visit in September 2025, he reported increased lower urinary tract symptoms for approximately 2 months.  His symptoms included frequency, voiding every 1-2 hours, nocturia 2-3 times, slight dysuria, intermittent stream, hesitancy, and decreased force of stream.  No gross hematuria.  No fevers or chills. IPSS = 21/5. He was treated for a presumed UTI with cephalexin .  No change in his symptoms.  He has been on testosterone  replacement therapy with IM testosterone  injections every 2 weeks.  He is currently using a dosage of 150 mg. His most recent testosterone  level was >1500.    His exam was consistent with prostatitis. PVR = 204 mL. He was treated with Bactrim  x 21 days and started on alfuzosin .  He returns today for follow-up.  He completed the Bactrim .  He continues on alfuzosin . He has noted improvement in his urinary symptoms.  He has had decreased frequency, decreased nocturia, and improvement in his stream.  He is not having any dysuria. IPSS = 4/2.  Portions of the above documentation were copied from a prior visit for review purposes only.   Past Medical History:  Past Medical History:  Diagnosis Date   Dislocation of metatarsophalangeal joint of right great toe    Erectile dysfunction 12/24/2012   [employer] will not cover Cialis     Essential hypertension 06/30/2014   GERD  (gastroesophageal reflux disease)    Headache    History of kidney stones    History of thrombocytosis 03/09/2013   Hypogonadism in male 12/15/2013   Recheck therapy January 2016    Kidney stone    Pneumonia age 18   Pre-diabetes    Sleep apnea    self report   Suicide attempt (HCC) 12/10/2013    Past Surgical History:  Past Surgical History:  Procedure Laterality Date   ANTERIOR CERVICAL DECOMP/DISCECTOMY FUSION N/A 09/08/2020   Procedure: ANTERIOR CERVICAL DECOMPRESSION FUSION CERVICAL 4- CERVICAL5 , CERVICAL 5 - CERVICAL 6 WITH INSTRUMENTATION AND ALLOGRAFT;  Surgeon: Beuford Anes, MD;  Location: [facility] OR;  Service: Orthopedics;  Laterality: N/A;   DENTAL SURGERY Left 2018   Has metal maybe titanium   NO PAST SURGERIES      Allergies:  Allergies  Allergen Reactions   Hydrocodone -Acetaminophen  Nausea And Vomiting   Lisinopril  Cough    Cough   Oxycodone-Acetaminophen  Nausea And Vomiting   Zoloft [Sertraline Hcl]     Suicide attempt    Family History:  Family History  Problem Relation Age of Onset   Cancer Mother        lung   Colon polyps Mother    Cancer Father        stomach, deceased @  19yrs   Hypertension Father    Colon cancer Father 87   Stomach cancer Father    Other Paternal Uncle  intestional cancer   Colon cancer Paternal Uncle 84   Stomach cancer Cousin    Colon cancer Cousin 40   Heart attack Cousin    Esophageal cancer Neg Hx     Social History:  Social History   Tobacco Use   Smoking status: Every Day    Types: E-cigarettes   Smokeless tobacco: Never  Vaping Use   Vaping status: Every Day   Substances: Nicotine, Flavoring  Substance Use Topics   Alcohol use: No   Drug use: No    ROS: Constitutional:  Negative for fever, chills, weight loss CV: Negative for chest pain, previous MI, hypertension Respiratory:  Negative for shortness of breath, wheezing, sleep apnea, frequent cough GI:  Negative for nausea, vomiting, bloody  stool, GERD  Physical exam: BP 128/84   Pulse 72   Ht 5' 8 (1.727 m)   Wt 180 lb (81.6 kg)   BMI 27.37 kg/m  GENERAL APPEARANCE:  Well appearing, well developed, well nourished, NAD HEENT:  Atraumatic, normocephalic, oropharynx clear NECK:  Supple without lymphadenopathy or thyromegaly ABDOMEN:  Soft, non-tender, no masses EXTREMITIES:  Moves all extremities well, without clubbing, cyanosis, or edema NEUROLOGIC:  Alert and oriented x 3, normal gait, CN II-XII grossly intact MENTAL STATUS:  appropriate BACK:  Non-tender to palpation, No CVAT SKIN:  Warm, dry, and intact    Results: U/A: 0-5 WBCs, 0-2 RBCs  PVR = 90 ml

## 2024-01-16 ENCOUNTER — Encounter: Payer: Self-pay | Admitting: Family Medicine

## 2024-01-16 DIAGNOSIS — E291 Testicular hypofunction: Secondary | ICD-10-CM

## 2024-01-17 MED ORDER — TESTOSTERONE CYPIONATE 200 MG/ML IM SOLN
INTRAMUSCULAR | 3 refills | Status: AC
Start: 2024-01-17 — End: ?

## 2024-02-04 ENCOUNTER — Other Ambulatory Visit

## 2024-02-04 DIAGNOSIS — R972 Elevated prostate specific antigen [PSA]: Secondary | ICD-10-CM

## 2024-02-05 ENCOUNTER — Ambulatory Visit: Payer: Self-pay | Admitting: Urology

## 2024-02-05 DIAGNOSIS — R972 Elevated prostate specific antigen [PSA]: Secondary | ICD-10-CM

## 2024-02-05 LAB — PSA: Prostate Specific Ag, Serum: 4 ng/mL (ref 0.0–4.0)

## 2024-02-18 DIAGNOSIS — M5451 Vertebrogenic low back pain: Secondary | ICD-10-CM | POA: Diagnosis not present

## 2024-02-18 DIAGNOSIS — M47816 Spondylosis without myelopathy or radiculopathy, lumbar region: Secondary | ICD-10-CM | POA: Diagnosis not present

## 2024-02-18 DIAGNOSIS — G894 Chronic pain syndrome: Secondary | ICD-10-CM | POA: Diagnosis not present

## 2024-03-30 ENCOUNTER — Other Ambulatory Visit

## 2024-03-30 ENCOUNTER — Other Ambulatory Visit: Payer: Self-pay

## 2024-03-30 DIAGNOSIS — R972 Elevated prostate specific antigen [PSA]: Secondary | ICD-10-CM

## 2024-03-30 LAB — PSA: Prostate Specific Ag, Serum: 7.1 ng/mL — ABNORMAL HIGH (ref 0.0–4.0)

## 2024-04-01 ENCOUNTER — Ambulatory Visit: Payer: Self-pay | Admitting: Urology

## 2024-04-01 DIAGNOSIS — N419 Inflammatory disease of prostate, unspecified: Secondary | ICD-10-CM

## 2024-04-01 MED ORDER — SULFAMETHOXAZOLE-TRIMETHOPRIM 800-160 MG PO TABS: 1.0000 | ORAL_TABLET | Freq: Two times a day (BID) | ORAL | 0 refills | Status: AC

## 2024-05-18 ENCOUNTER — Ambulatory Visit: Admitting: Urology

## 2025-02-03 ENCOUNTER — Other Ambulatory Visit
